# Patient Record
Sex: Male | Born: 1945 | Race: White | Hispanic: No | Marital: Married | Smoking: Never smoker
Health system: Southern US, Community
[De-identification: ages and names within clinical notes are randomized; demographics above are authoritative.]

---

## 2020-12-13 DIAGNOSIS — K661 Hemoperitoneum: Secondary | ICD-10-CM

## 2020-12-13 HISTORY — DX: Hemoperitoneum: K66.1

## 2021-05-13 DIAGNOSIS — I82A11 Acute embolism and thrombosis of right axillary vein: Secondary | ICD-10-CM

## 2021-05-13 HISTORY — DX: Acute embolism and thrombosis of right axillary vein: I82.A11

## 2021-05-28 ENCOUNTER — Inpatient Hospital Stay
Admission: RE | Admit: 2021-05-28 | Discharge: 2021-07-23 | Disposition: A | Discharge status: Skilled Nursing Facility | Payer: Medicare Other | Source: Ambulatory Visit | Attending: Internal Medicine | Admitting: Internal Medicine

## 2021-05-28 DIAGNOSIS — R58 Hemorrhage, not elsewhere classified: Secondary | ICD-10-CM

## 2021-05-28 DIAGNOSIS — K567 Ileus, unspecified: Secondary | ICD-10-CM

## 2021-05-28 DIAGNOSIS — R509 Fever, unspecified: Secondary | ICD-10-CM

## 2021-05-28 DIAGNOSIS — J189 Pneumonia, unspecified organism: Secondary | ICD-10-CM

## 2021-05-29 LAB — BASIC METABOLIC PANEL
Anion gap: 7 (ref 5–15)
BUN: 25 mg/dL — ABNORMAL HIGH (ref 8–23)
CO2: 25 mmol/L (ref 22–32)
Calcium: 10.4 mg/dL — ABNORMAL HIGH (ref 8.9–10.3)
Chloride: 102 mmol/L (ref 98–111)
Creatinine, Ser: 0.88 mg/dL (ref 0.61–1.24)
GFR, Estimated: >60 mL/min (ref >60)
Glucose, Bld: 105 mg/dL — ABNORMAL HIGH (ref 70–99)
Potassium: 3.8 mmol/L (ref 3.5–5.1)
Sodium: 134 mmol/L — ABNORMAL LOW (ref 135–145)

## 2021-05-29 LAB — CBC
HCT: 37.7 % — ABNORMAL LOW (ref 39.0–52.0)
Hemoglobin: 13.1 g/dL (ref 13.0–17.0)
MCH: 31.2 pg (ref 26.0–34.0)
MCHC: 34.7 g/dL (ref 30.0–36.0)
MCV: 89.8 fL (ref 80.0–100.0)
Platelets: 238 10*3/uL (ref 150–400)
RBC: 4.2 MIL/uL — ABNORMAL LOW (ref 4.22–5.81)
RDW: 13 % (ref 11.5–15.5)
WBC: 7.8 10*3/uL (ref 4.0–10.5)
nRBC: 0 % (ref 0.0–0.2)

## 2021-05-29 LAB — VANCOMYCIN, TROUGH
Vancomycin Tr: 39 ug/mL (ref 15–20)
Vancomycin Tr: 29 ug/mL (ref 15–20)

## 2021-05-30 ENCOUNTER — Other Ambulatory Visit (HOSPITAL_COMMUNITY): Payer: Medicare Other

## 2021-05-30 LAB — CBC
HCT: 36.9 % — ABNORMAL LOW (ref 39.0–52.0)
Hemoglobin: 12.9 g/dL — ABNORMAL LOW (ref 13.0–17.0)
MCH: 31.3 pg (ref 26.0–34.0)
MCHC: 35 g/dL (ref 30.0–36.0)
MCV: 89.6 fL (ref 80.0–100.0)
Platelets: 308 10*3/uL (ref 150–400)
RBC: 4.12 MIL/uL — ABNORMAL LOW (ref 4.22–5.81)
RDW: 13.1 % (ref 11.5–15.5)
WBC: 8.6 10*3/uL (ref 4.0–10.5)
nRBC: 0 % (ref 0.0–0.2)

## 2021-05-30 LAB — VANCOMYCIN, TROUGH: Vancomycin Tr: 18 ug/mL (ref 15–20)

## 2021-05-31 ENCOUNTER — Other Ambulatory Visit (HOSPITAL_COMMUNITY): Payer: Medicare Other

## 2021-05-31 LAB — BASIC METABOLIC PANEL
Anion gap: 13 (ref 5–15)
BUN: 25 mg/dL — ABNORMAL HIGH (ref 8–23)
CO2: 25 mmol/L (ref 22–32)
Calcium: 10.6 mg/dL — ABNORMAL HIGH (ref 8.9–10.3)
Chloride: 97 mmol/L — ABNORMAL LOW (ref 98–111)
Creatinine, Ser: 1.18 mg/dL (ref 0.61–1.24)
GFR, Estimated: >60 mL/min (ref >60)
Glucose, Bld: 112 mg/dL — ABNORMAL HIGH (ref 70–99)
Potassium: 4 mmol/L (ref 3.5–5.1)
Sodium: 135 mmol/L (ref 135–145)

## 2021-05-31 LAB — BLOOD GAS, ARTERIAL
Acid-Base Excess: 0.5 mmol/L (ref 0.0–2.0)
Bicarbonate: 23.9 mmol/L (ref 20.0–28.0)
FIO2: 21
O2 Saturation: 98.5 %
Patient temperature: 37
pCO2 arterial: 33.7 mmHg (ref 32.0–48.0)
pH, Arterial: 7.464 — ABNORMAL HIGH (ref 7.350–7.450)
pO2, Arterial: 109 mmHg — ABNORMAL HIGH (ref 83.0–108.0)

## 2021-05-31 LAB — CBC
HCT: 38.7 % — ABNORMAL LOW (ref 39.0–52.0)
Hemoglobin: 13.1 g/dL (ref 13.0–17.0)
MCH: 30.8 pg (ref 26.0–34.0)
MCHC: 33.9 g/dL (ref 30.0–36.0)
MCV: 91.1 fL (ref 80.0–100.0)
Platelets: 307 10*3/uL (ref 150–400)
RBC: 4.25 MIL/uL (ref 4.22–5.81)
RDW: 13.2 % (ref 11.5–15.5)
WBC: 10.3 10*3/uL (ref 4.0–10.5)
nRBC: 0 % (ref 0.0–0.2)

## 2021-06-01 ENCOUNTER — Encounter (HOSPITAL_BASED_OUTPATIENT_CLINIC_OR_DEPARTMENT_OTHER): Payer: Medicare Other

## 2021-06-01 DIAGNOSIS — I82401 Acute embolism and thrombosis of unspecified deep veins of right lower extremity: Secondary | ICD-10-CM

## 2021-06-01 LAB — BASIC METABOLIC PANEL
Anion gap: 12 (ref 5–15)
BUN: 20 mg/dL (ref 8–23)
CO2: 23 mmol/L (ref 22–32)
Calcium: 10.3 mg/dL (ref 8.9–10.3)
Chloride: 96 mmol/L — ABNORMAL LOW (ref 98–111)
Creatinine, Ser: 1.17 mg/dL (ref 0.61–1.24)
GFR, Estimated: >60 mL/min (ref >60)
Glucose, Bld: 117 mg/dL — ABNORMAL HIGH (ref 70–99)
Potassium: 3.7 mmol/L (ref 3.5–5.1)
Sodium: 131 mmol/L — ABNORMAL LOW (ref 135–145)

## 2021-06-01 NOTE — Progress Notes (Signed)
VASCULAR LAB    Right upper extremity venous has been performed.  See CV proc for preliminary results.  Gave verbal report to Dr. Norlene Duel, [facility], RVT 06/01/2021, 11:42 AM

## 2021-06-02 ENCOUNTER — Other Ambulatory Visit (HOSPITAL_COMMUNITY): Payer: Medicare Other

## 2021-06-03 LAB — PROTIME-INR
INR: 1.2 (ref 0.8–1.2)
Prothrombin Time: 15.6 seconds — ABNORMAL HIGH (ref 11.4–15.2)

## 2021-06-03 NOTE — Progress Notes (Signed)
IR consulted by Dr. Sharyon Medicus for possible image-guided percutaneous gastrostomy tube placement.  Case/images have been reviewed by Dr. Lowella Dandy who approves procedure.  Discussed case with Dawn, RN who states patient has been tolerating PO intake for the past few days. Discussed with Dr. Sharyon Medicus who recommends holding on gastrotomy tube placement at this time. No plans for IR interventions- will delete order. Dr. Sharyon Medicus aware to re-consult IR if procedure requested in the future.  IR available if needed.   Benjamin Boga Amora Sheehy, PA-C 06/03/2021, 9:07 AM

## 2021-06-04 LAB — PROTIME-INR
INR: 1.2 (ref 0.8–1.2)
Prothrombin Time: 14.9 seconds (ref 11.4–15.2)

## 2021-06-05 LAB — PROTIME-INR
INR: 1.6 — ABNORMAL HIGH (ref 0.8–1.2)
Prothrombin Time: 19.3 seconds — ABNORMAL HIGH (ref 11.4–15.2)

## 2021-06-06 LAB — BASIC METABOLIC PANEL
Anion gap: 9 (ref 5–15)
BUN: 8 mg/dL (ref 8–23)
CO2: 25 mmol/L (ref 22–32)
Calcium: 9.7 mg/dL (ref 8.9–10.3)
Chloride: 97 mmol/L — ABNORMAL LOW (ref 98–111)
Creatinine, Ser: 1.05 mg/dL (ref 0.61–1.24)
GFR, Estimated: >60 mL/min (ref >60)
Glucose, Bld: 122 mg/dL — ABNORMAL HIGH (ref 70–99)
Potassium: 3 mmol/L — ABNORMAL LOW (ref 3.5–5.1)
Sodium: 131 mmol/L — ABNORMAL LOW (ref 135–145)

## 2021-06-06 LAB — CBC
HCT: 33.6 % — ABNORMAL LOW (ref 39.0–52.0)
Hemoglobin: 11.8 g/dL — ABNORMAL LOW (ref 13.0–17.0)
MCH: 30.8 pg (ref 26.0–34.0)
MCHC: 35.1 g/dL (ref 30.0–36.0)
MCV: 87.7 fL (ref 80.0–100.0)
Platelets: 277 10*3/uL (ref 150–400)
RBC: 3.83 MIL/uL — ABNORMAL LOW (ref 4.22–5.81)
RDW: 12.8 % (ref 11.5–15.5)
WBC: 5.6 10*3/uL (ref 4.0–10.5)
nRBC: 0 % (ref 0.0–0.2)

## 2021-06-06 LAB — MAGNESIUM: Magnesium: 1.6 mg/dL — ABNORMAL LOW (ref 1.7–2.4)

## 2021-06-06 LAB — PROTIME-INR
INR: 2.1 — ABNORMAL HIGH (ref 0.8–1.2)
Prothrombin Time: 23.6 seconds — ABNORMAL HIGH (ref 11.4–15.2)

## 2021-06-07 LAB — PROTIME-INR
INR: 3.3 — ABNORMAL HIGH (ref 0.8–1.2)
Prothrombin Time: 33.2 seconds — ABNORMAL HIGH (ref 11.4–15.2)

## 2021-06-07 LAB — POTASSIUM: Potassium: 3.4 mmol/L — ABNORMAL LOW (ref 3.5–5.1)

## 2021-06-07 LAB — MAGNESIUM: Magnesium: 2.1 mg/dL (ref 1.7–2.4)

## 2021-06-08 LAB — BASIC METABOLIC PANEL
Anion gap: 7 (ref 5–15)
BUN: 12 mg/dL (ref 8–23)
CO2: 26 mmol/L (ref 22–32)
Calcium: 9.3 mg/dL (ref 8.9–10.3)
Chloride: 100 mmol/L (ref 98–111)
Creatinine, Ser: 1 mg/dL (ref 0.61–1.24)
GFR, Estimated: >60 mL/min (ref >60)
Glucose, Bld: 119 mg/dL — ABNORMAL HIGH (ref 70–99)
Potassium: 3.8 mmol/L (ref 3.5–5.1)
Sodium: 133 mmol/L — ABNORMAL LOW (ref 135–145)

## 2021-06-08 LAB — PROTIME-INR
INR: 3.4 — ABNORMAL HIGH (ref 0.8–1.2)
Prothrombin Time: 33.9 seconds — ABNORMAL HIGH (ref 11.4–15.2)

## 2021-06-09 LAB — PROTIME-INR
INR: 3.1 — ABNORMAL HIGH (ref 0.8–1.2)
Prothrombin Time: 32 seconds — ABNORMAL HIGH (ref 11.4–15.2)

## 2021-06-10 LAB — PROTIME-INR
INR: 3.5 — ABNORMAL HIGH (ref 0.8–1.2)
Prothrombin Time: 35 seconds — ABNORMAL HIGH (ref 11.4–15.2)

## 2021-06-10 NOTE — Progress Notes (Signed)
IR consulted by Dr. Manson Passey for re-evaluation of possible image-guided percutaneous gastrostomy tube placement.   Gastrostomy tube originally ordered by Dr. Sharyon Medicus the week of 06/01/2021. At that time, case/images were reviewed by Dr. Lowella Dandy who approved procedure. However, also at that time patient tolerating PO intake- therefore procedure was cancelled. Per Perlie Gold, RN and Dr. Manson Passey patient's PO intake now declining, therefore re-evaluation for placement was made.   Patient currently on Coumadin/Aspirin (LD today 06/10/2021) with INR 3.5 today- minimum 5 day hold for these mediations, along with INR <1.5 to proceed. Dr. Manson Passey to hold Coumadin/Aspirin at this time and trend INR. Plan for image-guided percutaneous gastrostomy tube placement in IR tentatively for early next week pending IR scheduling/holding of anticoagulation/INR within acceptable limits. Formal consult to follow.  Please call IR with questions/concerns.   Benjamin Boga Nechemia Chiappetta, PA-C 06/10/2021, 2:45 PM

## 2021-06-11 LAB — BASIC METABOLIC PANEL
Anion gap: 9 (ref 5–15)
BUN: 19 mg/dL (ref 8–23)
CO2: 21 mmol/L — ABNORMAL LOW (ref 22–32)
Calcium: 9.1 mg/dL (ref 8.9–10.3)
Chloride: 98 mmol/L (ref 98–111)
Creatinine, Ser: 1.05 mg/dL (ref 0.61–1.24)
GFR, Estimated: >60 mL/min (ref >60)
Glucose, Bld: 118 mg/dL — ABNORMAL HIGH (ref 70–99)
Potassium: 4 mmol/L (ref 3.5–5.1)
Sodium: 128 mmol/L — ABNORMAL LOW (ref 135–145)

## 2021-06-11 LAB — CBC
HCT: 24.5 % — ABNORMAL LOW (ref 39.0–52.0)
Hemoglobin: 8.2 g/dL — ABNORMAL LOW (ref 13.0–17.0)
MCH: 30.3 pg (ref 26.0–34.0)
MCHC: 33.5 g/dL (ref 30.0–36.0)
MCV: 90.4 fL (ref 80.0–100.0)
Platelets: 242 10*3/uL (ref 150–400)
RBC: 2.71 MIL/uL — ABNORMAL LOW (ref 4.22–5.81)
RDW: 13.2 % (ref 11.5–15.5)
WBC: 11.4 10*3/uL — ABNORMAL HIGH (ref 4.0–10.5)
nRBC: 0 % (ref 0.0–0.2)

## 2021-06-11 LAB — PROTIME-INR
INR: 4 — ABNORMAL HIGH (ref 0.8–1.2)
Prothrombin Time: 38.9 seconds — ABNORMAL HIGH (ref 11.4–15.2)

## 2021-06-12 LAB — PROTIME-INR
INR: 3.8 — ABNORMAL HIGH (ref 0.8–1.2)
Prothrombin Time: 37.7 seconds — ABNORMAL HIGH (ref 11.4–15.2)

## 2021-06-13 LAB — SARS CORONAVIRUS 2 (TAT 6-24 HRS): SARS Coronavirus 2: NEGATIVE

## 2021-06-13 LAB — BASIC METABOLIC PANEL
Anion gap: 7 (ref 5–15)
BUN: 16 mg/dL (ref 8–23)
CO2: 21 mmol/L — ABNORMAL LOW (ref 22–32)
Calcium: 8.8 mg/dL — ABNORMAL LOW (ref 8.9–10.3)
Chloride: 101 mmol/L (ref 98–111)
Creatinine, Ser: 0.93 mg/dL (ref 0.61–1.24)
GFR, Estimated: >60 mL/min (ref >60)
Glucose, Bld: 112 mg/dL — ABNORMAL HIGH (ref 70–99)
Potassium: 3.7 mmol/L (ref 3.5–5.1)
Sodium: 129 mmol/L — ABNORMAL LOW (ref 135–145)

## 2021-06-13 LAB — PROTIME-INR
INR: 3.1 — ABNORMAL HIGH (ref 0.8–1.2)
Prothrombin Time: 31.7 seconds — ABNORMAL HIGH (ref 11.4–15.2)

## 2021-06-13 LAB — CBC
HCT: 20 % — ABNORMAL LOW (ref 39.0–52.0)
Hemoglobin: 6.5 g/dL — CL (ref 13.0–17.0)
MCH: 29.7 pg (ref 26.0–34.0)
MCHC: 32.5 g/dL (ref 30.0–36.0)
MCV: 91.3 fL (ref 80.0–100.0)
Platelets: 229 10*3/uL (ref 150–400)
RBC: 2.19 MIL/uL — ABNORMAL LOW (ref 4.22–5.81)
RDW: 13.3 % (ref 11.5–15.5)
WBC: 8.1 10*3/uL (ref 4.0–10.5)
nRBC: 0 % (ref 0.0–0.2)

## 2021-06-13 LAB — ABO/RH: ABO/RH(D): B POS

## 2021-06-13 NOTE — Consult Note (Signed)
Chief Complaint: Patient was seen in consultation today for percutaneous gastric tube placement at the request of Dr Sharyon Medicus   Supervising Physician: Malachy Moan  Patient Status: [facility] IP  History of Present Illness: Benjamin Barnett is a 75 y.o. male   [facility] pt Hx HTN; HLD; Chronic Resp Failure Admitted with AMS Suffered cardiac arrest/PEA-- transferred to [facility] after hospitalization Extubated  Admitted to [facility] to manage Resp failure Dysphagia Encephalopathy  Need for long term care Request made for percutaneous gastric tube placement  LD coumadin 06/06/21  Imaging reviewed by IR Rad--- approved for procedure Planned for 06/16/21    Allergies: Patient has no allergy information on record.  Medications: Prior to Admission medications   Not on File     No family history on file.  Social History   Socioeconomic History   Marital status: Married    Spouse name: Not on file   Number of children: Not on file   Years of education: Not on file   Highest education level: Not on file  Occupational History   Not on file  Tobacco Use   Smoking status: Not on file   Smokeless tobacco: Not on file  Substance and Sexual Activity   Alcohol use: Not on file   Drug use: Not on file   Sexual activity: Not on file  Other Topics Concern   Not on file  Social History Narrative   Not on file   Social Determinants of Health   Financial Resource Strain: Not on file  Food Insecurity: Not on file  Transportation Needs: Not on file  Physical Activity: Not on file  Stress: Not on file  Social Connections: Not on file    Review of Systems: A 12 point ROS discussed and pertinent positives are indicated in the HPI above.  All other systems are negative.    Vital Signs: There were no vitals taken for this visit.  Physical Exam HENT:     Mouth/Throat:     Mouth: Mucous membranes are moist.  Cardiovascular:     Rate and Rhythm: Normal rate.  Pulmonary:      Breath sounds: Normal breath sounds.  Abdominal:     Palpations: Abdomen is soft.  Musculoskeletal:     Comments: Asleep-- difficult to arouse  Skin:    General: Skin is warm.  Neurological:     Comments: Does not follow commands  Psychiatric:     Comments: Spoke to Dtr Joycie Peek via phone Agrees to procedure    Imaging: CT ABDOMEN WO CONTRAST  Result Date: 06/02/2021 CLINICAL DATA:  Evaluate anatomy for percutaneous gastrostomy tube placement. EXAM: CT ABDOMEN WITHOUT CONTRAST TECHNIQUE: Multidetector CT imaging of the abdomen was performed following the standard protocol without IV contrast. COMPARISON:  CT abdomen 01/01/2011 FINDINGS: Lower chest: Small right pleural effusion. Questionable trace left pleural fluid. Compressive atelectasis at the lung bases. Patient has coronary artery calcifications. Poorly defined nodular area at the right lung base on sequence 4, image 6 most likely related to atelectasis but there is also motion artifact in this area. However, this area is indeterminate and the nodular area measures 8 x 6 mm, mean diameter of 7 mm. Hepatobiliary: Cholecystectomy. Artifact from the patient's arms limits evaluation of the liver. Pancreas: Unremarkable. No pancreatic ductal dilatation or surrounding inflammatory changes. Spleen: Normal in size without focal abnormality. Adrenals/Urinary Tract: Normal appearance of the adrenal glands. Large low-density cyst in the right kidney upper pole measures up to 9.0 cm. 3  mm stone in the right kidney lower pole region. Negative for hydronephrosis. Stomach/Bowel: Stomach is along the anterior upper abdomen with a normal configuration. There are no bowel structures between the stomach and the anterior abdominal wall. The transverse colon is caudal to the stomach. There is no evidence for bowel obstruction or focal bowel inflammation. Vascular/Lymphatic: Atherosclerotic calcifications in the abdominal aorta without aneurysm. No  abdominal lymph node enlargement. Other: Negative for ascites.  Negative for free air. Musculoskeletal: Significant narrowing of the spinal canal at L3-L4 and L4-L5. Severe degenerative facet arthropathy in the lower lumbar spine. Bridging osteophytes in the lower thoracic spine. IMPRESSION: 1. Anatomy is amendable for percutaneous gastrostomy tube placement. 2. Small right pleural effusion with compressive atelectasis at the lung bases. 3. Nodular area at the right lung base could be associated with atelectasis but indeterminate. Mean diameter of this area measures 7 mm. Non-contrast chest CT at 6-12 months is recommended. If the nodule is stable at time of repeat CT, then future CT at 18-24 months (from today's scan) is considered optional for low-risk patients, but is recommended for high-risk patients. This recommendation follows the consensus statement: Guidelines for Management of Incidental Pulmonary Nodules Detected on CT Images: From the Fleischner Society 2017; Radiology 2017; 284:228-243. 4. Nonobstructive right kidney stone.  Large right renal cyst. 5. Severe degenerative changes in lumbar spine with evidence of spinal canal narrowing. Electronically Signed   By: Richarda Overlie M.D.   On: 06/02/2021 17:18   DG CHEST PORT 1 VIEW  Result Date: 05/31/2021 CLINICAL DATA:  75 year old male with shortness of breath. EXAM: PORTABLE CHEST 1 VIEW COMPARISON:  Abdominal radiographs 05/30/2021. FINDINGS: Portable AP semi upright view at 0637 hours. Mildly low lung volumes. Normal cardiac size and mediastinal contours. Streaky asymmetric perihilar opacity on the right. Allowing for portable technique the left lung is clear. No pneumothorax or pleural effusion. Severe degenerative changes at the shoulders greater on the left. No acute osseous abnormality identified. Paucity of bowel gas in the upper abdomen. IMPRESSION: Asymmetric right perihilar opacity is nonspecific but suspicious for bronchopneumonia.  Electronically Signed   By: Odessa Fleming M.D.   On: 05/31/2021 07:55   DG Abd Portable 1V  Result Date: 05/30/2021 CLINICAL DATA:  Nausea and vomiting EXAM: PORTABLE ABDOMEN - 1 VIEW COMPARISON:  None. FINDINGS: Two supine frontal views of the abdomen and pelvis demonstrate an unremarkable bowel gas pattern. No masses or abnormal calcifications. Diffuse thoracolumbar spondylosis. Lung bases are clear. IMPRESSION: 1. Unremarkable bowel gas pattern. Electronically Signed   By: Sharlet Salina M.D.   On: 05/30/2021 21:19   VAS [place] UPPER EXTREMITY VENOUS DUPLEX  Result Date: 06/01/2021 UPPER VENOUS STUDY  Patient Name:  CLESTER CHLEBOWSKI [facility]  Date of Exam:   06/01/2021 Medical Rec #: [id number]          Accession #:    [id number] Date of Birth: [birth year]          Patient Gender: M Patient Age:   74Y Exam Location:  [facility] Procedure:      VAS [place] UPPER EXTREMITY VENOUS DUPLEX Referring Phys: [id number] ALI HIJAZI --------------------------------------------------------------------------------  Indications: IV team saw clot while trying to place PICC Limitations: Patient confusion and movement. Comparison Study: No prior study Performing Technologist: Sherren Kerns RVS  Examination Guidelines: A complete evaluation includes B-mode imaging, spectral Doppler, color Doppler, and power Doppler as needed of all accessible portions of each vessel. Bilateral testing is considered an integral part of a complete examination.  Limited examinations for reoccurring indications may be performed as noted.  Right Findings: +----------+------------+---------+-----------+----------+-------+ RIGHT     CompressiblePhasicitySpontaneousPropertiesSummary +----------+------------+---------+-----------+----------+-------+ IJV                      Yes       Yes                      +----------+------------+---------+-----------+----------+-------+ Subclavian               Yes       Yes                       +----------+------------+---------+-----------+----------+-------+ Axillary      None       No        No                       +----------+------------+---------+-----------+----------+-------+ Brachial      None       No        No                       +----------+------------+---------+-----------+----------+-------+ Radial        Full                                          +----------+------------+---------+-----------+----------+-------+ Ulnar         Full                                          +----------+------------+---------+-----------+----------+-------+ Cephalic      Full                                          +----------+------------+---------+-----------+----------+-------+ Basilic       Full                                          +----------+------------+---------+-----------+----------+-------+  Left Findings: +----------+------------+---------+-----------+----------+-------+ LEFT      CompressiblePhasicitySpontaneousPropertiesSummary +----------+------------+---------+-----------+----------+-------+ Subclavian               Yes       Yes                      +----------+------------+---------+-----------+----------+-------+  Summary:  Right: No evidence of superficial vein thrombosis in the upper extremity. Findings consistent with acute deep vein thrombosis involving the right axillary vein and right brachial veins.  Left: No evidence of thrombosis in the subclavian.  *See table(s) above for measurements and observations.  Diagnosing physician: Fabienne Bruns MD Electronically signed by Fabienne Bruns MD on 06/01/2021 at 6:50:52 PM.    Final     Labs:  CBC: Recent Labs    05/30/21 1435 05/31/21 0330 06/06/21 0348 06/11/21 0313  WBC 8.6 10.3 5.6 11.4*  HGB 12.9* 13.1 11.8* 8.2*  HCT 36.9* 38.7* 33.6* 24.5*  PLT 308 307 277 242    COAGS: Recent Labs    06/10/21 0507 06/11/21 0313 06/12/21 0409 06/13/21 0425  INR  3.5* 4.0* 3.8* 3.1*    BMP: Recent Labs    06/01/21 0732 06/06/21 0348 06/07/21 1015 06/08/21 0754 06/11/21 0313  NA 131* 131*  --  133* 128*  K 3.7 3.0* 3.4* 3.8 4.0  CL 96* 97*  --  100 98  CO2 23 25  --  26 21*  GLUCOSE 117* 122*  --  119* 118*  BUN 20 8  --  12 19  CALCIUM 10.3 9.7  --  9.3 9.1  CREATININE 1.17 1.05  --  1.00 1.05  GFRNONAA >60 >60  --  >60 >60    LIVER FUNCTION TESTS: No results for input(s): BILITOT, AST, ALT, ALKPHOS, PROT, ALBUMIN in the last 8760 hours.  TUMOR MARKERS: No results for input(s): AFPTM, CEA, CA199, CHROMGRNA in the last 8760 hours.  Assessment and Plan  AMS; encephalopathy Dysphagia Need for long term care Scheduled for percutaneous gastric tube placement 7/5 in IR LD Coumadin 6/25 per RN Risks and benefits image guided gastrostomy tube placement was discussed with the patient's daughter Joycie Peekmanda Daniels via phone including, but not limited to the need for a barium enema during the procedure, bleeding, infection, peritonitis and/or damage to adjacent structures.  All questions were answered, patient is agreeable to proceed. Consent signed and in chart.   Thank you for this interesting consult.  I greatly enjoyed meeting [facility] and look forward to participating in their care.  A copy of this report was sent to the requesting provider on this date.  Electronically Signed: Robet LeuPamela A Elza Sortor, PA-C 06/13/2021, 10:56 AM   I spent a total of 40 Minutes    in face to face in clinical consultation, greater than 50% of which was counseling/coordinating care for percutaneous gastric tube placement

## 2021-06-14 ENCOUNTER — Other Ambulatory Visit (HOSPITAL_COMMUNITY): Payer: Medicare Other

## 2021-06-14 LAB — BASIC METABOLIC PANEL
Anion gap: 6 (ref 5–15)
BUN: 13 mg/dL (ref 8–23)
CO2: 24 mmol/L (ref 22–32)
Calcium: 8.7 mg/dL — ABNORMAL LOW (ref 8.9–10.3)
Chloride: 101 mmol/L (ref 98–111)
Creatinine, Ser: 0.96 mg/dL (ref 0.61–1.24)
GFR, Estimated: >60 mL/min (ref >60)
Glucose, Bld: 113 mg/dL — ABNORMAL HIGH (ref 70–99)
Potassium: 3.4 mmol/L — ABNORMAL LOW (ref 3.5–5.1)
Sodium: 131 mmol/L — ABNORMAL LOW (ref 135–145)

## 2021-06-14 LAB — CBC
HCT: 21.6 % — ABNORMAL LOW (ref 39.0–52.0)
Hemoglobin: 7.3 g/dL — ABNORMAL LOW (ref 13.0–17.0)
MCH: 30.7 pg (ref 26.0–34.0)
MCHC: 33.8 g/dL (ref 30.0–36.0)
MCV: 90.8 fL (ref 80.0–100.0)
Platelets: 237 10*3/uL (ref 150–400)
RBC: 2.38 MIL/uL — ABNORMAL LOW (ref 4.22–5.81)
RDW: 14.2 % (ref 11.5–15.5)
WBC: 8.6 10*3/uL (ref 4.0–10.5)
nRBC: 0 % (ref 0.0–0.2)

## 2021-06-14 LAB — BPAM RBC
Blood Product Expiration Date: 202207172359
ISSUE DATE / TIME: 202207021542
Unit Type and Rh: 7300

## 2021-06-14 LAB — URINALYSIS, ROUTINE W REFLEX MICROSCOPIC
Bilirubin Urine: NEGATIVE
Glucose, UA: NEGATIVE mg/dL
Ketones, ur: NEGATIVE mg/dL
Nitrite: NEGATIVE
Protein, ur: NEGATIVE mg/dL
RBC / HPF: >50 RBC/hpf — ABNORMAL HIGH (ref 0–5)
Specific Gravity, Urine: 1.006 (ref 1.005–1.030)
pH: 6 (ref 5.0–8.0)

## 2021-06-14 LAB — TYPE AND SCREEN
ABO/RH(D): B POS
Antibody Screen: NEGATIVE
Unit division: 0

## 2021-06-14 LAB — PROTIME-INR
INR: 1.7 — ABNORMAL HIGH (ref 0.8–1.2)
Prothrombin Time: 20.3 seconds — ABNORMAL HIGH (ref 11.4–15.2)

## 2021-06-15 LAB — PROTIME-INR
INR: 1.3 — ABNORMAL HIGH (ref 0.8–1.2)
Prothrombin Time: 16.3 seconds — ABNORMAL HIGH (ref 11.4–15.2)

## 2021-06-16 ENCOUNTER — Other Ambulatory Visit (HOSPITAL_COMMUNITY): Payer: Medicare Other

## 2021-06-16 HISTORY — PX: IR GASTROSTOMY TUBE MOD SED: IMG625

## 2021-06-16 LAB — PROTIME-INR
INR: 1.1 (ref 0.8–1.2)
Prothrombin Time: 14.6 seconds (ref 11.4–15.2)

## 2021-06-16 LAB — CBC
HCT: 24 % — ABNORMAL LOW (ref 39.0–52.0)
Hemoglobin: 7.8 g/dL — ABNORMAL LOW (ref 13.0–17.0)
MCH: 29.9 pg (ref 26.0–34.0)
MCHC: 32.5 g/dL (ref 30.0–36.0)
MCV: 92 fL (ref 80.0–100.0)
Platelets: 289 10*3/uL (ref 150–400)
RBC: 2.61 MIL/uL — ABNORMAL LOW (ref 4.22–5.81)
RDW: 14.2 % (ref 11.5–15.5)
WBC: 8.2 10*3/uL (ref 4.0–10.5)
nRBC: 0 % (ref 0.0–0.2)

## 2021-06-16 LAB — URINE CULTURE: Culture: 60000 — AB

## 2021-06-16 MED ORDER — ONDANSETRON HCL 4 MG/2ML IJ SOLN
INTRAMUSCULAR | Status: AC
  Filled 2021-06-16: qty 2

## 2021-06-16 MED ORDER — MIDAZOLAM HCL 2 MG/2ML IJ SOLN
INTRAMUSCULAR | Status: AC | PRN
  Administered 2021-06-16: 0.5 mg via INTRAVENOUS

## 2021-06-16 MED ORDER — MIDAZOLAM HCL 2 MG/2ML IJ SOLN
INTRAMUSCULAR | Status: AC
  Filled 2021-06-16: qty 4

## 2021-06-16 MED ORDER — FENTANYL CITRATE (PF) 100 MCG/2ML IJ SOLN
INTRAMUSCULAR | Status: AC
  Filled 2021-06-16: qty 4

## 2021-06-16 MED ORDER — CEFAZOLIN SODIUM-DEXTROSE 2-4 GM/100ML-% IV SOLN
INTRAVENOUS | Status: AC
  Administered 2021-06-16: 2 g via INTRAVENOUS
  Filled 2021-06-16: qty 100

## 2021-06-16 MED ORDER — ONDANSETRON HCL 4 MG/2ML IJ SOLN
INTRAMUSCULAR | Status: AC | PRN
  Administered 2021-06-16: 4 mg via INTRAVENOUS

## 2021-06-16 MED ORDER — FENTANYL CITRATE (PF) 100 MCG/2ML IJ SOLN
INTRAMUSCULAR | Status: AC | PRN
  Administered 2021-06-16: 25 ug via INTRAVENOUS

## 2021-06-16 MED ORDER — LIDOCAINE HCL (PF) 1 % IJ SOLN
INTRAMUSCULAR | Status: AC | PRN
  Administered 2021-06-16: 30 mL

## 2021-06-16 MED ORDER — LIDOCAINE HCL 1 % IJ SOLN
INTRAMUSCULAR | Status: AC
  Filled 2021-06-16: qty 20

## 2021-06-16 MED ORDER — IOHEXOL 300 MG/ML  SOLN: 50.0000 mL | Freq: Once | INTRAMUSCULAR | Status: DC | PRN

## 2021-06-16 MED ORDER — GLUCAGON HCL RDNA (DIAGNOSTIC) 1 MG IJ SOLR
INTRAMUSCULAR | Status: AC
  Filled 2021-06-16: qty 1

## 2021-06-16 MED ORDER — CEFAZOLIN SODIUM-DEXTROSE 2-4 GM/100ML-% IV SOLN: 2.0000 g | Freq: Once | INTRAVENOUS | Status: AC

## 2021-06-16 NOTE — Procedures (Signed)
Interventional Radiology Procedure Note  Procedure: Placement of percutaneous 20F pull-through gastrostomy tube. Complications: None Recommendations: - NPO except for sips and chips remainder of today and overnight - Maintain G-tube to LWS until tomorrow morning  - May advance diet as tolerated and begin using tube tomorrow morning  Signed,   Danicia Terhaar S. Kamonte Mcmichen, DO   

## 2021-06-17 LAB — PROTIME-INR
INR: 1.3 — ABNORMAL HIGH (ref 0.8–1.2)
Prothrombin Time: 16.1 seconds — ABNORMAL HIGH (ref 11.4–15.2)

## 2021-06-17 NOTE — Progress Notes (Signed)
   Percutaneous gastric tube placed in IR 7/5  Site is c/d/I Tender to touch area No bleeding Afeb  May use now

## 2021-06-18 LAB — PROTIME-INR
INR: 1.3 — ABNORMAL HIGH (ref 0.8–1.2)
Prothrombin Time: 16.3 seconds — ABNORMAL HIGH (ref 11.4–15.2)

## 2021-06-19 LAB — BASIC METABOLIC PANEL
Anion gap: 4 — ABNORMAL LOW (ref 5–15)
BUN: 14 mg/dL (ref 8–23)
CO2: 27 mmol/L (ref 22–32)
Calcium: 9.7 mg/dL (ref 8.9–10.3)
Chloride: 101 mmol/L (ref 98–111)
Creatinine, Ser: 0.87 mg/dL (ref 0.61–1.24)
GFR, Estimated: >60 mL/min (ref >60)
Glucose, Bld: 104 mg/dL — ABNORMAL HIGH (ref 70–99)
Potassium: 3.5 mmol/L (ref 3.5–5.1)
Sodium: 132 mmol/L — ABNORMAL LOW (ref 135–145)

## 2021-06-19 LAB — PROTIME-INR
INR: 1.4 — ABNORMAL HIGH (ref 0.8–1.2)
Prothrombin Time: 17.6 seconds — ABNORMAL HIGH (ref 11.4–15.2)

## 2021-06-19 LAB — CULTURE, BLOOD (ROUTINE X 2)
Culture: NO GROWTH
Culture: NO GROWTH
Special Requests: ADEQUATE
Special Requests: ADEQUATE

## 2021-06-19 LAB — CBC
HCT: 23.7 % — ABNORMAL LOW (ref 39.0–52.0)
Hemoglobin: 7.8 g/dL — ABNORMAL LOW (ref 13.0–17.0)
MCH: 30.1 pg (ref 26.0–34.0)
MCHC: 32.9 g/dL (ref 30.0–36.0)
MCV: 91.5 fL (ref 80.0–100.0)
Platelets: 233 10*3/uL (ref 150–400)
RBC: 2.59 MIL/uL — ABNORMAL LOW (ref 4.22–5.81)
RDW: 14.3 % (ref 11.5–15.5)
WBC: 6.6 10*3/uL (ref 4.0–10.5)
nRBC: 0 % (ref 0.0–0.2)

## 2021-06-19 LAB — MAGNESIUM: Magnesium: 1.7 mg/dL (ref 1.7–2.4)

## 2021-06-20 ENCOUNTER — Other Ambulatory Visit (HOSPITAL_COMMUNITY): Payer: Medicare Other

## 2021-06-20 LAB — HEMOGLOBIN AND HEMATOCRIT, BLOOD
HCT: 24 % — ABNORMAL LOW (ref 39.0–52.0)
HCT: 23.2 % — ABNORMAL LOW (ref 39.0–52.0)
Hemoglobin: 7.7 g/dL — ABNORMAL LOW (ref 13.0–17.0)
Hemoglobin: 7.8 g/dL — ABNORMAL LOW (ref 13.0–17.0)

## 2021-06-20 LAB — CBC
HCT: 25.3 % — ABNORMAL LOW (ref 39.0–52.0)
Hemoglobin: 8.1 g/dL — ABNORMAL LOW (ref 13.0–17.0)
MCH: 29.5 pg (ref 26.0–34.0)
MCHC: 32 g/dL (ref 30.0–36.0)
MCV: 92 fL (ref 80.0–100.0)
Platelets: 307 10*3/uL (ref 150–400)
RBC: 2.75 MIL/uL — ABNORMAL LOW (ref 4.22–5.81)
RDW: 14.4 % (ref 11.5–15.5)
WBC: 7.4 10*3/uL (ref 4.0–10.5)
nRBC: 0 % (ref 0.0–0.2)

## 2021-06-20 LAB — PROTIME-INR
INR: 1.7 — ABNORMAL HIGH (ref 0.8–1.2)
Prothrombin Time: 19.8 seconds — ABNORMAL HIGH (ref 11.4–15.2)

## 2021-06-20 MED ORDER — IOHEXOL 300 MG/ML  SOLN
100.0000 mL | Freq: Once | INTRAMUSCULAR | Status: AC | PRN
  Administered 2021-06-20: 100 mL via INTRAVENOUS

## 2021-06-21 ENCOUNTER — Other Ambulatory Visit (HOSPITAL_COMMUNITY): Payer: Medicare Other

## 2021-06-21 LAB — PROTIME-INR
INR: 2 — ABNORMAL HIGH (ref 0.8–1.2)
Prothrombin Time: 22.8 seconds — ABNORMAL HIGH (ref 11.4–15.2)

## 2021-06-21 LAB — HEMOGLOBIN AND HEMATOCRIT, BLOOD
HCT: 24.4 % — ABNORMAL LOW (ref 39.0–52.0)
Hemoglobin: 7.8 g/dL — ABNORMAL LOW (ref 13.0–17.0)

## 2021-06-22 ENCOUNTER — Other Ambulatory Visit (HOSPITAL_COMMUNITY): Payer: Medicare Other

## 2021-06-22 LAB — PREPARE FRESH FROZEN PLASMA: Unit division: 0

## 2021-06-22 LAB — CBC
HCT: 27.5 % — ABNORMAL LOW (ref 39.0–52.0)
Hemoglobin: 8.8 g/dL — ABNORMAL LOW (ref 13.0–17.0)
MCH: 29.5 pg (ref 26.0–34.0)
MCHC: 32 g/dL (ref 30.0–36.0)
MCV: 92.3 fL (ref 80.0–100.0)
Platelets: 314 10*3/uL (ref 150–400)
RBC: 2.98 MIL/uL — ABNORMAL LOW (ref 4.22–5.81)
RDW: 14.5 % (ref 11.5–15.5)
WBC: 8.3 10*3/uL (ref 4.0–10.5)
nRBC: 0 % (ref 0.0–0.2)

## 2021-06-22 LAB — BPAM FFP
Blood Product Expiration Date: 202207122359
Blood Product Expiration Date: 202207142359
ISSUE DATE / TIME: 202207090856
ISSUE DATE / TIME: 202207101421
Unit Type and Rh: 7300
Unit Type and Rh: 7300

## 2021-06-22 LAB — BASIC METABOLIC PANEL
Anion gap: 8 (ref 5–15)
BUN: 9 mg/dL (ref 8–23)
CO2: 26 mmol/L (ref 22–32)
Calcium: 9.6 mg/dL (ref 8.9–10.3)
Chloride: 100 mmol/L (ref 98–111)
Creatinine, Ser: 0.92 mg/dL (ref 0.61–1.24)
GFR, Estimated: >60 mL/min (ref >60)
Glucose, Bld: 104 mg/dL — ABNORMAL HIGH (ref 70–99)
Potassium: 3.5 mmol/L (ref 3.5–5.1)
Sodium: 134 mmol/L — ABNORMAL LOW (ref 135–145)

## 2021-06-22 LAB — PROTIME-INR
INR: 2.1 — ABNORMAL HIGH (ref 0.8–1.2)
Prothrombin Time: 23.9 seconds — ABNORMAL HIGH (ref 11.4–15.2)

## 2021-06-23 ENCOUNTER — Other Ambulatory Visit (HOSPITAL_COMMUNITY): Payer: Medicare Other

## 2021-06-23 LAB — CBC
HCT: 27.3 % — ABNORMAL LOW (ref 39.0–52.0)
Hemoglobin: 8.6 g/dL — ABNORMAL LOW (ref 13.0–17.0)
MCH: 29.4 pg (ref 26.0–34.0)
MCHC: 31.5 g/dL (ref 30.0–36.0)
MCV: 93.2 fL (ref 80.0–100.0)
Platelets: 293 10*3/uL (ref 150–400)
RBC: 2.93 MIL/uL — ABNORMAL LOW (ref 4.22–5.81)
RDW: 14.9 % (ref 11.5–15.5)
WBC: 9.1 10*3/uL (ref 4.0–10.5)
nRBC: 0 % (ref 0.0–0.2)

## 2021-06-23 LAB — PROTIME-INR
INR: 2 — ABNORMAL HIGH (ref 0.8–1.2)
Prothrombin Time: 22.6 seconds — ABNORMAL HIGH (ref 11.4–15.2)

## 2021-06-24 ENCOUNTER — Other Ambulatory Visit (HOSPITAL_COMMUNITY): Payer: Medicare Other

## 2021-06-24 LAB — PROTIME-INR
INR: 1.8 — ABNORMAL HIGH (ref 0.8–1.2)
Prothrombin Time: 21.3 seconds — ABNORMAL HIGH (ref 11.4–15.2)

## 2021-06-25 LAB — PROTIME-INR
INR: 1.6 — ABNORMAL HIGH (ref 0.8–1.2)
Prothrombin Time: 19.4 seconds — ABNORMAL HIGH (ref 11.4–15.2)

## 2021-06-25 LAB — CBC
HCT: 28.1 % — ABNORMAL LOW (ref 39.0–52.0)
Hemoglobin: 8.9 g/dL — ABNORMAL LOW (ref 13.0–17.0)
MCH: 29.3 pg (ref 26.0–34.0)
MCHC: 31.7 g/dL (ref 30.0–36.0)
MCV: 92.4 fL (ref 80.0–100.0)
Platelets: 317 10*3/uL (ref 150–400)
RBC: 3.04 MIL/uL — ABNORMAL LOW (ref 4.22–5.81)
RDW: 14.6 % (ref 11.5–15.5)
WBC: 10.2 10*3/uL (ref 4.0–10.5)
nRBC: 0 % (ref 0.0–0.2)

## 2021-06-25 LAB — COMPREHENSIVE METABOLIC PANEL
ALT: 14 U/L (ref 0–44)
AST: 22 U/L (ref 15–41)
Albumin: 2.5 g/dL — ABNORMAL LOW (ref 3.5–5.0)
Alkaline Phosphatase: 51 U/L (ref 38–126)
Anion gap: 6 (ref 5–15)
BUN: 18 mg/dL (ref 8–23)
CO2: 27 mmol/L (ref 22–32)
Calcium: 9.2 mg/dL (ref 8.9–10.3)
Chloride: 103 mmol/L (ref 98–111)
Creatinine, Ser: 0.95 mg/dL (ref 0.61–1.24)
GFR, Estimated: >60 mL/min (ref >60)
Glucose, Bld: 119 mg/dL — ABNORMAL HIGH (ref 70–99)
Potassium: 3.5 mmol/L (ref 3.5–5.1)
Sodium: 136 mmol/L (ref 135–145)
Total Bilirubin: 1.4 mg/dL — ABNORMAL HIGH (ref 0.3–1.2)
Total Protein: 6.4 g/dL — ABNORMAL LOW (ref 6.5–8.1)

## 2021-06-25 LAB — LIPASE, BLOOD: Lipase: 35 U/L (ref 11–51)

## 2021-06-25 NOTE — Consult Note (Signed)
Infectious Disease Consultation   Benjamin Barnett  DGL:[id number]  DOB: September 13, 1946  DOA: 05/28/2021  Requesting physician: Dr. Sharyon Medicus  Reason for consultation: Antibiotic Recommendations  History of Present Illness: Benjamin Barnett is an 75 y.o. male who has a past medical history significant for hypertension, hyperlipidemia, kidney stones, gout, seizure.  He was admitted to the acute facility for altered mental status thought to be secondary to postictal state.  He was initially admitted to the ICU on 05/15/2019 to home.  On 05/17/2021 patient apparently had a cardiac arrest and was emergently intubated.  He was in PEA and return to spontaneous circulation after a round of epinephrine.  He was extubated on 05/20/2021 and was placed on BiPAP.  He developed pneumonia with MRSA and received treatment with IV vancomycin.  He had agitation and remained on Precedex drip which was eventually tapered off.  Patient was started on Seroquel.  Due to his complex medical problems he was transferred and admitted to [facility].  After admission here he started having fevers and swelling and pain of the parotid gland.  MRI of the brain per report mild diffuse ventricular prominence, difficult to exclude NPH, diffuse soft tissue swelling involving the left face and posterior upper neck with involvement of the left parotid gland concerning for acute parotitis.  He also had UTI with Morganella and treated with IV ciprofloxacin.  Patient had retroperitoneal hemorrhage.  Also had acute DVT of the right upper extremity.  He had other medical problems including MRSA pneumonia status post treatment with IV vancomycin.  Review of Systems:  He is very confused.  Has hand mittens.  Unable to obtain review of systems at this time.  Past Medical History: Gout, hypertension, hyperlipidemia  Past Surgical History: PEG tube placement  Allergies:  No Known Allergies  Social History:  has no history  on file for tobacco use, alcohol use, and drug use.  Family History: Parents deceased  Physical Exam: Vitals: Temperature 100.4, pulse 105, respiratory 21, blood pressure 135/73, pulse oximetry 97%  Constitutional: Ill-appearing male, confused Head: Atraumatic, normocephalic Eyes: PERLA, EOMI, irises appear normal, anicteric sclera  ENMT: external ears and nose appear normal, Lips appears normal, moist oral mucosa, has swelling and erythema, tenderness of the left face and neck area  Neck: Swelling on the left side of the face and neck area CVS: S1-S2  Respiratory: Rhonchi, no wheezing Abdomen: soft nontender, nondistended, positive bowel sounds  Musculoskeletal: No edema Neuro: Confused, not following commands.  Unable to do a neurologic exam at this time Psych: Confused, agitated Skin: Has swelling, erythema of the left side of the face and neck, no other rashes  Data reviewed:  I have personally reviewed following labs and imaging studies Labs:  CBC: Recent Labs  Lab 06/19/21 0619 06/20/21 0214 06/20/21 0600 06/20/21 1724 06/21/21 0318 06/22/21 0609 06/23/21 0420 06/25/21 0250  WBC 6.6 7.4  --   --   --  8.3 9.1 10.2  HGB 7.8* 8.1*   < > 7.8* 7.8* 8.8* 8.6* 8.9*  HCT 23.7* 25.3*   < > 23.2* 24.4* 27.5* 27.3* 28.1*  MCV 91.5 92.0  --   --   --  92.3 93.2 92.4  PLT 233 307  --   --   --  314 293 317   < > = values in this interval not displayed.    Basic Metabolic Panel: Recent Labs  Lab 06/19/21 [phone]  06/22/21 0609 06/25/21 0250  NA 132* 134* 136  K 3.5 3.5 3.5  CL 101 100 103  CO2 27 26 27   GLUCOSE 104* 104* 119*  BUN 14 9 18   CREATININE 0.87 0.92 0.95  CALCIUM 9.7 9.6 9.2  MG 1.7  --   --    GFR CrCl cannot be calculated (Unknown ideal weight.). Liver Function Tests: Recent Labs  Lab 06/25/21 0250  AST 22  ALT 14  ALKPHOS 51  BILITOT 1.4*  PROT 6.4*  ALBUMIN 2.5*   Recent Labs  Lab 06/25/21 0250  LIPASE 35   No results for input(s):  AMMONIA in the last 168 hours. Coagulation profile Recent Labs  Lab 06/21/21 0318 06/22/21 0609 06/23/21 0420 06/24/21 0544 06/25/21 0250  INR 2.0* 2.1* 2.0* 1.8* 1.6*    Cardiac Enzymes: No results for input(s): CKTOTAL, CKMB, CKMBINDEX, TROPONINI in the last 168 hours. BNP: Invalid input(s): POCBNP CBG: No results for input(s): GLUCAP in the last 168 hours. D-Dimer No results for input(s): DDIMER in the last 72 hours. Hgb A1c No results for input(s): HGBA1C in the last 72 hours. Lipid Profile No results for input(s): CHOL, HDL, LDLCALC, TRIG, CHOLHDL, LDLDIRECT in the last 72 hours. Thyroid function studies No results for input(s): TSH, T4TOTAL, T3FREE, THYROIDAB in the last 72 hours.  Invalid input(s): FREET3 Anemia work up No results for input(s): VITAMINB12, FOLATE, FERRITIN, TIBC, IRON, RETICCTPCT in the last 72 hours. Urinalysis    Component Value Date/Time   COLORURINE YELLOW 06/14/2021 1537   APPEARANCEUR CLEAR 06/14/2021 1537   LABSPEC 1.006 06/14/2021 1537   PHURINE 6.0 06/14/2021 1537   GLUCOSEU NEGATIVE 06/14/2021 1537   HGBUR LARGE (A) 06/14/2021 1537   BILIRUBINUR NEGATIVE 06/14/2021 1537   KETONESUR NEGATIVE 06/14/2021 1537   PROTEINUR NEGATIVE 06/14/2021 1537   NITRITE NEGATIVE 06/14/2021 1537   LEUKOCYTESUR TRACE (A) 06/14/2021 1537     Sepsis Labs Invalid input(s): PROCALCITONIN,  WBC,  LACTICIDVEN Microbiology No results found for this or any previous visit (from the past 240 hour(s)).   Inpatient Medications:   Please see MAR  Radiological Exams on Admission: MR BRAIN WO CONTRAST  Result Date: 06/25/2021 CLINICAL DATA:  Initial evaluation for hydrocephalus. EXAM: MRI HEAD WITHOUT CONTRAST TECHNIQUE: Multiplanar, multiecho pulse sequences of the brain and surrounding structures were obtained without intravenous contrast. COMPARISON:  Prior CT from 06/23/2021. FINDINGS: Brain: Examination moderately to severely degraded by motion artifact.  Diffuse prominence of the CSF containing spaces compatible with generalized age-related cerebral atrophy. No significant cerebral white matter disease seen on this motion degraded exam. No abnormal foci of restricted diffusion to suggest acute or subacute ischemia. Gray-white matter differentiation maintained. No encephalomalacia to suggest chronic cortical infarction. No definite foci of susceptibility artifact to suggest acute or chronic intracranial hemorrhage. No mass lesion, midline shift or mass effect. Mild diffuse ventricular prominence, felt to most likely be related to underlying parenchymal volume loss. A degree of NPH is difficult to exclude, and could be considered in the correct clinical setting. No visible transependymal flow of CSF. No extra-axial fluid collection. Pituitary gland suprasellar region within normal limits. Midline structures intact and normal. Vascular: Major intracranial vascular flow voids are grossly maintained at the skull base. Skull and upper cervical spine: Craniocervical junction within normal limits. Bone marrow signal intensity normal. No scalp soft tissue abnormality. Sinuses/Orbits: Patient status post bilateral ocular lens replacement. Globes and orbital soft tissues demonstrate no acute finding. Paranasal sinuses are largely clear. No significant mastoid effusion.  Other: There is diffuse soft tissue swelling seen involving the partially visualized left face and posterior upper neck, with involvement of the partially visualized left parotid gland. Findings raise the possibility for acute parotitis, incompletely assessed on this exam. No visible collections, ductal dilatation, or other focal abnormality. IMPRESSION: 1. Technically limited exam due to extensive motion artifact. 2. No acute intracranial abnormality. 3. Mild diffuse ventricular prominence, felt to most likely be related to underlying parenchymal volume loss. A degree of NPH is difficult to exclude, and could  be considered in the correct clinical setting. Correlation with LP with opening pressure could be performed for further evaluation as warranted. 4. Diffuse soft tissue swelling involving the partially visualized left face and posterior upper neck, with involvement of the partially visualized left parotid gland. Findings raise the possibility for acute parotitis, incompletely assessed on this exam. Correlation with physical exam recommended. Electronically Signed   By: Rise Mu M.D.   On: 06/25/2021 01:49    Impression/Recommendations Active Problems: Acute parotitis UTI with Morganella Morgani Pneumonia with MRSA Encephalopathy Acute respiratory failure Status post cardiopulmonary arrest, PEA arrest Acute kidney injury Dysphagia/protein calorie malnutrition  Acute parotitis: Patient noted to have acute parotitis with swelling, tenderness of the left parotid area and the neck.  Continue treatment with IV vancomycin, Unasyn.  Continue to monitor closely.  Plan to treat for tentative duration of 2 weeks with the Unasyn for the parotitis.  Suggest to also check blood cultures.  Please monitor BUN/cr closely while on antibiotics.  Also monitor CBC.  UTI: He had UTI with Morganella.  He received treatment with IV ciprofloxacin.  He is high risk for recurrent UTI.  Currently on antibiotics as mentioned above for the acute parotitis.  Pneumonia: He had respiratory cultures that showed MRSA.  On treatment with vancomycin.  Recommend to treat for duration of 1 week pending improvement.  He also has dysphagia and high risk for recurrent aspiration and aspiration pneumonia.  Now on antibiotics as mentioned above.  If his respite status worsens would recommend to repeat chest imaging preferably chest CT that can be done without contrast if concern for renal compromise.  Encephalopathy: Likely toxic/metabolic.  MRI report as mentioned above.  He was noted to have underlying parenchymal volume loss.   Antibiotics as mentioned above.  Continue supportive management.  Further investigation and management per the primary team.  Acute respiratory failure: Status post PEA arrest and intubation.  He is currently on oxygen by nasal cannula.  Also has pneumonia.  On antibiotics as mentioned above.  Continue further management per the primary team.  Status post PEA arrest: Continue medications and management per the primary team.  Acute kidney injury: Please monitor BUN/creatinine closely while on antibiotics.  Avoid nephrotoxic medications.  Further management per the primary team.  Dysphagia/protein calorie malnutrition: Due to his dysphagia he is high risk for aspiration and recurrent aspiration pneumonia.  Management of PCM per the primary team.  Unfortunately due to his complex medical problems he is high risk for worsening and decompensation.  Plan of care discussed with the primary team and pharmacy.  Thank you for this consultation.    Vonzella Nipple M.D. 06/25/2021, 6:31 PM

## 2021-06-26 LAB — BASIC METABOLIC PANEL
Anion gap: 6 (ref 5–15)
BUN: 19 mg/dL (ref 8–23)
CO2: 26 mmol/L (ref 22–32)
Calcium: 9.3 mg/dL (ref 8.9–10.3)
Chloride: 104 mmol/L (ref 98–111)
Creatinine, Ser: 0.84 mg/dL (ref 0.61–1.24)
GFR, Estimated: >60 mL/min (ref >60)
Glucose, Bld: 116 mg/dL — ABNORMAL HIGH (ref 70–99)
Potassium: 3.7 mmol/L (ref 3.5–5.1)
Sodium: 136 mmol/L (ref 135–145)

## 2021-06-26 LAB — VANCOMYCIN, TROUGH: Vancomycin Tr: 17 ug/mL (ref 15–20)

## 2021-06-26 LAB — PROTIME-INR
INR: 1.5 — ABNORMAL HIGH (ref 0.8–1.2)
Prothrombin Time: 18.4 seconds — ABNORMAL HIGH (ref 11.4–15.2)

## 2021-06-27 LAB — PROTIME-INR
INR: 1.3 — ABNORMAL HIGH (ref 0.8–1.2)
Prothrombin Time: 16.4 seconds — ABNORMAL HIGH (ref 11.4–15.2)

## 2021-06-28 LAB — CBC
HCT: 31.8 % — ABNORMAL LOW (ref 39.0–52.0)
Hemoglobin: 9.8 g/dL — ABNORMAL LOW (ref 13.0–17.0)
MCH: 28.4 pg (ref 26.0–34.0)
MCHC: 30.8 g/dL (ref 30.0–36.0)
MCV: 92.2 fL (ref 80.0–100.0)
Platelets: 357 10*3/uL (ref 150–400)
RBC: 3.45 MIL/uL — ABNORMAL LOW (ref 4.22–5.81)
RDW: 14.5 % (ref 11.5–15.5)
WBC: 8.3 10*3/uL (ref 4.0–10.5)
nRBC: 0 % (ref 0.0–0.2)

## 2021-06-28 LAB — BASIC METABOLIC PANEL
Anion gap: 10 (ref 5–15)
BUN: 19 mg/dL (ref 8–23)
CO2: 27 mmol/L (ref 22–32)
Calcium: 9.1 mg/dL (ref 8.9–10.3)
Chloride: 99 mmol/L (ref 98–111)
Creatinine, Ser: 0.83 mg/dL (ref 0.61–1.24)
GFR, Estimated: >60 mL/min (ref >60)
Glucose, Bld: 136 mg/dL — ABNORMAL HIGH (ref 70–99)
Potassium: 3.4 mmol/L — ABNORMAL LOW (ref 3.5–5.1)
Sodium: 136 mmol/L (ref 135–145)

## 2021-06-28 LAB — POTASSIUM: Potassium: 4 mmol/L (ref 3.5–5.1)

## 2021-06-28 LAB — PROTIME-INR
INR: 1.2 (ref 0.8–1.2)
Prothrombin Time: 15.6 seconds — ABNORMAL HIGH (ref 11.4–15.2)

## 2021-06-29 LAB — POTASSIUM: Potassium: 3.9 mmol/L (ref 3.5–5.1)

## 2021-06-29 LAB — PROTIME-INR
INR: 1.2 (ref 0.8–1.2)
Prothrombin Time: 15.6 seconds — ABNORMAL HIGH (ref 11.4–15.2)

## 2021-06-30 LAB — CULTURE, BLOOD (ROUTINE X 2)
Culture: NO GROWTH
Culture: NO GROWTH
Special Requests: ADEQUATE
Special Requests: ADEQUATE

## 2021-06-30 LAB — PROTIME-INR
INR: 1.2 (ref 0.8–1.2)
Prothrombin Time: 15 seconds (ref 11.4–15.2)

## 2021-07-01 LAB — CBC
HCT: 29.4 % — ABNORMAL LOW (ref 39.0–52.0)
Hemoglobin: 9.3 g/dL — ABNORMAL LOW (ref 13.0–17.0)
MCH: 28.7 pg (ref 26.0–34.0)
MCHC: 31.6 g/dL (ref 30.0–36.0)
MCV: 90.7 fL (ref 80.0–100.0)
Platelets: 314 10*3/uL (ref 150–400)
RBC: 3.24 MIL/uL — ABNORMAL LOW (ref 4.22–5.81)
RDW: 14.7 % (ref 11.5–15.5)
WBC: 6.6 10*3/uL (ref 4.0–10.5)
nRBC: 0 % (ref 0.0–0.2)

## 2021-07-01 LAB — BASIC METABOLIC PANEL
Anion gap: 6 (ref 5–15)
BUN: 15 mg/dL (ref 8–23)
CO2: 27 mmol/L (ref 22–32)
Calcium: 9.3 mg/dL (ref 8.9–10.3)
Chloride: 101 mmol/L (ref 98–111)
Creatinine, Ser: 0.86 mg/dL (ref 0.61–1.24)
GFR, Estimated: >60 mL/min (ref >60)
Glucose, Bld: 110 mg/dL — ABNORMAL HIGH (ref 70–99)
Potassium: 3.9 mmol/L (ref 3.5–5.1)
Sodium: 134 mmol/L — ABNORMAL LOW (ref 135–145)

## 2021-07-01 LAB — MAGNESIUM: Magnesium: 1.5 mg/dL — ABNORMAL LOW (ref 1.7–2.4)

## 2021-07-01 LAB — PROTIME-INR
INR: 1.2 (ref 0.8–1.2)
Prothrombin Time: 14.9 seconds (ref 11.4–15.2)

## 2021-07-01 LAB — VANCOMYCIN, TROUGH: Vancomycin Tr: 19 ug/mL (ref 15–20)

## 2021-07-01 LAB — PHOSPHORUS: Phosphorus: 4.2 mg/dL (ref 2.5–4.6)

## 2021-07-02 LAB — MAGNESIUM: Magnesium: 2 mg/dL (ref 1.7–2.4)

## 2021-07-02 LAB — PROTIME-INR
INR: 1.2 (ref 0.8–1.2)
Prothrombin Time: 14.8 seconds (ref 11.4–15.2)

## 2021-07-03 LAB — PROTIME-INR
INR: 1.1 (ref 0.8–1.2)
Prothrombin Time: 14.3 seconds (ref 11.4–15.2)

## 2021-07-04 LAB — PROTIME-INR
INR: 1.2 (ref 0.8–1.2)
Prothrombin Time: 15.6 seconds — ABNORMAL HIGH (ref 11.4–15.2)

## 2021-07-05 LAB — VANCOMYCIN, TROUGH: Vancomycin Tr: 32 ug/mL (ref 15–20)

## 2021-07-05 LAB — PROTIME-INR
INR: 1.2 (ref 0.8–1.2)
Prothrombin Time: 14.9 seconds (ref 11.4–15.2)

## 2021-07-05 LAB — BASIC METABOLIC PANEL
Anion gap: 8 (ref 5–15)
BUN: 16 mg/dL (ref 8–23)
CO2: 26 mmol/L (ref 22–32)
Calcium: 9.6 mg/dL (ref 8.9–10.3)
Chloride: 102 mmol/L (ref 98–111)
Creatinine, Ser: 1.08 mg/dL (ref 0.61–1.24)
GFR, Estimated: >60 mL/min (ref >60)
Glucose, Bld: 117 mg/dL — ABNORMAL HIGH (ref 70–99)
Potassium: 3.6 mmol/L (ref 3.5–5.1)
Sodium: 136 mmol/L (ref 135–145)

## 2021-07-05 LAB — CBC
HCT: 28.3 % — ABNORMAL LOW (ref 39.0–52.0)
Hemoglobin: 8.8 g/dL — ABNORMAL LOW (ref 13.0–17.0)
MCH: 28.7 pg (ref 26.0–34.0)
MCHC: 31.1 g/dL (ref 30.0–36.0)
MCV: 92.2 fL (ref 80.0–100.0)
Platelets: 283 10*3/uL (ref 150–400)
RBC: 3.07 MIL/uL — ABNORMAL LOW (ref 4.22–5.81)
RDW: 15.8 % — ABNORMAL HIGH (ref 11.5–15.5)
WBC: 7 10*3/uL (ref 4.0–10.5)
nRBC: 0 % (ref 0.0–0.2)

## 2021-07-05 LAB — MAGNESIUM: Magnesium: 1.7 mg/dL (ref 1.7–2.4)

## 2021-07-05 NOTE — Progress Notes (Signed)
PROGRESS NOTE    Benjamin Barnett  ZOX:[id number] DOB: Nov 14, 1946 DOA: 05/28/2021  Brief Narrative:  Benjamin Barnett is an 75 y.o. male who has a past medical history significant for hypertension, hyperlipidemia, kidney stones, gout, seizure.  He was admitted to the acute facility for altered mental status thought to be secondary to postictal state.  He was initially admitted to the ICU on 05/15/2019 to home.  On 05/17/2021 patient apparently had a cardiac arrest and was emergently intubated.  He was in PEA and return to spontaneous circulation after a round of epinephrine.  He was extubated on 05/20/2021 and was placed on BiPAP.  He developed pneumonia with MRSA and received treatment with IV vancomycin.  He had agitation and remained on Precedex drip which was eventually tapered off.  Patient was started on Seroquel.  Due to his complex medical problems he was transferred and admitted to [facility].  After admission here he started having fevers and swelling and pain of the parotid gland.  MRI of the brain per report mild diffuse ventricular prominence, difficult to exclude NPH, diffuse soft tissue swelling involving the left face and posterior upper neck with involvement of the left parotid gland concerning for acute parotitis.  He also had UTI with Morganella and treated with IV ciprofloxacin.  Patient had retroperitoneal hemorrhage.  Also had acute DVT of the right upper extremity.  He had other medical problems including MRSA pneumonia status post treatment with IV vancomycin.  He remains very confused.  He had a disseminated rash.  He has been started on IV acyclovir for probable herpes zoster.   Assessment & Plan: Active Problems: Acute parotitis UTI with Morganella Morgani Pneumonia with MRSA Disseminated rash, probable herpes zoster  Encephalopathy Acute respiratory failure Status post cardiopulmonary arrest, PEA arrest Acute kidney injury Dysphagia/protein calorie  malnutrition   Acute parotitis: Patient noted to have acute parotitis with swelling, tenderness of the left parotid area and the neck.  Continue treatment with IV Unasyn.  Continue to monitor closely.  Plan to treat for tentative duration of 2 weeks with the Unasyn for the parotitis with tentative end date 07/08/2021.  If he is not improving can extend for another week.  If he starts having worsening leukocytosis or fevers greater than 101 suggest to also check blood cultures.  Please monitor BUN/cr closely while on antibiotics.  Also monitor CBC.   UTI: He had UTI with Morganella.  He received treatment with IV ciprofloxacin.  He is high risk for recurrent UTI.  Currently on antibiotics as mentioned above for the acute parotitis.   Pneumonia: He had respiratory cultures that showed MRSA.  Received treatment with IV vancomycin. He also has dysphagia and high risk for recurrent aspiration and aspiration pneumonia. Antibiotics as mentioned above.  If his respiratory status worsens would recommend to repeat chest imaging preferably chest CT that can be done without contrast if concern for renal compromise.  Rash: He had a disseminated rash thought to be herpes zoster.  On treatment with acyclovir.  Improving   Encephalopathy: Likely toxic/metabolic.  MRI report as mentioned above.  He was noted to have underlying parenchymal volume loss.  Antibiotics as mentioned above.  He continues to be confused. Continue supportive management.  Further investigation and management per the primary team.   Acute respiratory failure: Status post PEA arrest and intubation.  He is currently on oxygen by nasal cannula.  Also has pneumonia.  On antibiotics as mentioned above.  Continue further management  per the primary team.   Status post PEA arrest: Continue medications and management per the primary team.   Acute kidney injury: Please monitor BUN/creatinine closely while on antibiotics.  Avoid nephrotoxic medications.   Further management per the primary team.   Dysphagia/protein calorie malnutrition: Due to his dysphagia he is high risk for aspiration and recurrent aspiration pneumonia.  Management of PCM per the primary team.   Unfortunately due to his complex medical problems he is high risk for worsening and decompensation.  Plan of care discussed with the primary team and pharmacy.   Subjective: He is opening eyes but remains very confused.  Erythema, swelling and tenderness of the left face and neck area improving.  Objective: Vitals: Temperature 99.8, pulse 100, respiratory 28, blood pressure 150/87, pulse oximetry 97%  Examination: Constitutional: Ill-appearing male, confused Head: Atraumatic, normocephalic Eyes: PERLA, EOMI, irises appear normal, anicteric sclera ENMT: external ears and nose appear normal, Lips appears normal, moist oral mucosa, has swelling and erythema, tenderness of the left face and neck area  Neck: Swelling on the left side of the face and neck area CVS: S1-S2 Respiratory: Rhonchi, no wheezing Abdomen: soft nontender, nondistended, positive bowel sounds  Musculoskeletal: No edema Neuro: Confused, not following commands.  Unable to do a neurologic exam at this time Psych: Confused, agitated Skin: Has swelling, erythema of the left side of the face and neck, no other rashes    Data Reviewed: I have personally reviewed following labs and imaging studies  CBC: Recent Labs  Lab 07/01/21 0541 07/05/21 0432  WBC 6.6 7.0  HGB 9.3* 8.8*  HCT 29.4* 28.3*  MCV 90.7 92.2  PLT 314 283    Basic Metabolic Panel: Recent Labs  Lab 06/29/21 0854 07/01/21 0541 07/02/21 0708 07/05/21 0432  NA  --  134*  --  136  K 3.9 3.9  --  3.6  CL  --  101  --  102  CO2  --  27  --  26  GLUCOSE  --  110*  --  117*  BUN  --  15  --  16  CREATININE  --  0.86  --  1.08  CALCIUM  --  9.3  --  9.6  MG  --  1.5* 2.0 1.7  PHOS  --  4.2  --   --     GFR: CrCl cannot be calculated  (Unknown ideal weight.).  Liver Function Tests: No results for input(s): AST, ALT, ALKPHOS, BILITOT, PROT, ALBUMIN in the last 168 hours.  CBG: No results for input(s): GLUCAP in the last 168 hours.   No results found for this or any previous visit (from the past 240 hour(s)).   Radiology Studies: No results found.   Scheduled Meds: Please see MAR   Vonzella Nipple, MD 07/05/2021, 11:27 PM

## 2021-07-06 ENCOUNTER — Other Ambulatory Visit (HOSPITAL_COMMUNITY): Payer: Medicare Other

## 2021-07-06 LAB — URINALYSIS, ROUTINE W REFLEX MICROSCOPIC
Bilirubin Urine: NEGATIVE
Glucose, UA: NEGATIVE mg/dL
Hgb urine dipstick: NEGATIVE
Ketones, ur: NEGATIVE mg/dL
Leukocytes,Ua: NEGATIVE
Nitrite: NEGATIVE
Protein, ur: NEGATIVE mg/dL
Specific Gravity, Urine: 1.015 (ref 1.005–1.030)
pH: 7 (ref 5.0–8.0)

## 2021-07-06 LAB — CBC
HCT: 29.5 % — ABNORMAL LOW (ref 39.0–52.0)
Hemoglobin: 9.1 g/dL — ABNORMAL LOW (ref 13.0–17.0)
MCH: 28.6 pg (ref 26.0–34.0)
MCHC: 30.8 g/dL (ref 30.0–36.0)
MCV: 92.8 fL (ref 80.0–100.0)
Platelets: 282 10*3/uL (ref 150–400)
RBC: 3.18 MIL/uL — ABNORMAL LOW (ref 4.22–5.81)
RDW: 16.2 % — ABNORMAL HIGH (ref 11.5–15.5)
WBC: 8.6 10*3/uL (ref 4.0–10.5)
nRBC: 0 % (ref 0.0–0.2)

## 2021-07-06 LAB — PROTIME-INR
INR: 1.2 (ref 0.8–1.2)
Prothrombin Time: 15.3 seconds — ABNORMAL HIGH (ref 11.4–15.2)

## 2021-07-06 LAB — BASIC METABOLIC PANEL
Anion gap: 9 (ref 5–15)
BUN: 19 mg/dL (ref 8–23)
CO2: 28 mmol/L (ref 22–32)
Calcium: 9.6 mg/dL (ref 8.9–10.3)
Chloride: 99 mmol/L (ref 98–111)
Creatinine, Ser: 1.13 mg/dL (ref 0.61–1.24)
GFR, Estimated: >60 mL/min (ref >60)
Glucose, Bld: 130 mg/dL — ABNORMAL HIGH (ref 70–99)
Potassium: 4 mmol/L (ref 3.5–5.1)
Sodium: 136 mmol/L (ref 135–145)

## 2021-07-06 LAB — VANCOMYCIN, TROUGH: Vancomycin Tr: 17 ug/mL (ref 15–20)

## 2021-07-06 LAB — MAGNESIUM: Magnesium: 2 mg/dL (ref 1.7–2.4)

## 2021-07-07 LAB — PROTIME-INR
INR: 1.2 (ref 0.8–1.2)
Prothrombin Time: 15.5 seconds — ABNORMAL HIGH (ref 11.4–15.2)

## 2021-07-07 LAB — URINE CULTURE: Culture: NO GROWTH

## 2021-07-08 LAB — PROTIME-INR
INR: 1.2 (ref 0.8–1.2)
Prothrombin Time: 14.8 seconds (ref 11.4–15.2)

## 2021-07-09 LAB — MAGNESIUM: Magnesium: 1.9 mg/dL (ref 1.7–2.4)

## 2021-07-09 LAB — CBC
HCT: 31.6 % — ABNORMAL LOW (ref 39.0–52.0)
Hemoglobin: 9.8 g/dL — ABNORMAL LOW (ref 13.0–17.0)
MCH: 28.8 pg (ref 26.0–34.0)
MCHC: 31 g/dL (ref 30.0–36.0)
MCV: 92.9 fL (ref 80.0–100.0)
Platelets: 294 10*3/uL (ref 150–400)
RBC: 3.4 MIL/uL — ABNORMAL LOW (ref 4.22–5.81)
RDW: 16.4 % — ABNORMAL HIGH (ref 11.5–15.5)
WBC: 7.5 10*3/uL (ref 4.0–10.5)
nRBC: 0 % (ref 0.0–0.2)

## 2021-07-09 LAB — BASIC METABOLIC PANEL
Anion gap: 10 (ref 5–15)
BUN: 25 mg/dL — ABNORMAL HIGH (ref 8–23)
CO2: 26 mmol/L (ref 22–32)
Calcium: 10.3 mg/dL (ref 8.9–10.3)
Chloride: 101 mmol/L (ref 98–111)
Creatinine, Ser: 1.15 mg/dL (ref 0.61–1.24)
GFR, Estimated: >60 mL/min (ref >60)
Glucose, Bld: 122 mg/dL — ABNORMAL HIGH (ref 70–99)
Potassium: 3.4 mmol/L — ABNORMAL LOW (ref 3.5–5.1)
Sodium: 137 mmol/L (ref 135–145)

## 2021-07-09 LAB — PROTIME-INR
INR: 1.2 (ref 0.8–1.2)
Prothrombin Time: 14.8 seconds (ref 11.4–15.2)

## 2021-07-10 LAB — PROTIME-INR
INR: 1.1 (ref 0.8–1.2)
Prothrombin Time: 14.6 seconds (ref 11.4–15.2)

## 2021-07-10 LAB — POTASSIUM: Potassium: 4 mmol/L (ref 3.5–5.1)

## 2021-07-11 LAB — PROTIME-INR
INR: 1.1 (ref 0.8–1.2)
Prothrombin Time: 14.6 seconds (ref 11.4–15.2)

## 2021-07-12 LAB — CBC
HCT: 35.2 % — ABNORMAL LOW (ref 39.0–52.0)
Hemoglobin: 10.7 g/dL — ABNORMAL LOW (ref 13.0–17.0)
MCH: 28.4 pg (ref 26.0–34.0)
MCHC: 30.4 g/dL (ref 30.0–36.0)
MCV: 93.4 fL (ref 80.0–100.0)
Platelets: 291 10*3/uL (ref 150–400)
RBC: 3.77 MIL/uL — ABNORMAL LOW (ref 4.22–5.81)
RDW: 16.7 % — ABNORMAL HIGH (ref 11.5–15.5)
WBC: 7.7 10*3/uL (ref 4.0–10.5)
nRBC: 0 % (ref 0.0–0.2)

## 2021-07-12 LAB — BASIC METABOLIC PANEL
Anion gap: 7 (ref 5–15)
BUN: 37 mg/dL — ABNORMAL HIGH (ref 8–23)
CO2: 28 mmol/L (ref 22–32)
Calcium: 10.4 mg/dL — ABNORMAL HIGH (ref 8.9–10.3)
Chloride: 103 mmol/L (ref 98–111)
Creatinine, Ser: 1.2 mg/dL (ref 0.61–1.24)
GFR, Estimated: >60 mL/min (ref >60)
Glucose, Bld: 120 mg/dL — ABNORMAL HIGH (ref 70–99)
Potassium: 3.8 mmol/L (ref 3.5–5.1)
Sodium: 138 mmol/L (ref 135–145)

## 2021-07-12 LAB — PROTIME-INR
INR: 1.1 (ref 0.8–1.2)
Prothrombin Time: 14.2 seconds (ref 11.4–15.2)

## 2021-07-13 LAB — PROTIME-INR
INR: 1.1 (ref 0.8–1.2)
Prothrombin Time: 14.4 seconds (ref 11.4–15.2)

## 2021-07-14 LAB — PROTIME-INR
INR: 1.2 (ref 0.8–1.2)
Prothrombin Time: 15.1 seconds (ref 11.4–15.2)

## 2021-07-15 LAB — PROTIME-INR
INR: 1.2 (ref 0.8–1.2)
Prothrombin Time: 14.9 seconds (ref 11.4–15.2)

## 2021-07-16 LAB — BASIC METABOLIC PANEL
Anion gap: 10 (ref 5–15)
BUN: 32 mg/dL — ABNORMAL HIGH (ref 8–23)
CO2: 26 mmol/L (ref 22–32)
Calcium: 11 mg/dL — ABNORMAL HIGH (ref 8.9–10.3)
Chloride: 102 mmol/L (ref 98–111)
Creatinine, Ser: 1.26 mg/dL — ABNORMAL HIGH (ref 0.61–1.24)
GFR, Estimated: >60 mL/min (ref >60)
Glucose, Bld: 101 mg/dL — ABNORMAL HIGH (ref 70–99)
Potassium: 3.5 mmol/L (ref 3.5–5.1)
Sodium: 138 mmol/L (ref 135–145)

## 2021-07-16 LAB — CBC
HCT: 35.5 % — ABNORMAL LOW (ref 39.0–52.0)
Hemoglobin: 10.9 g/dL — ABNORMAL LOW (ref 13.0–17.0)
MCH: 28.8 pg (ref 26.0–34.0)
MCHC: 30.7 g/dL (ref 30.0–36.0)
MCV: 93.9 fL (ref 80.0–100.0)
Platelets: 243 10*3/uL (ref 150–400)
RBC: 3.78 MIL/uL — ABNORMAL LOW (ref 4.22–5.81)
RDW: 17 % — ABNORMAL HIGH (ref 11.5–15.5)
WBC: 5.7 10*3/uL (ref 4.0–10.5)
nRBC: 0 % (ref 0.0–0.2)

## 2021-07-16 LAB — PROTIME-INR
INR: 1.1 (ref 0.8–1.2)
Prothrombin Time: 13.8 seconds (ref 11.4–15.2)

## 2021-07-17 LAB — PROTIME-INR
INR: 1.1 (ref 0.8–1.2)
Prothrombin Time: 14.1 seconds (ref 11.4–15.2)

## 2021-07-18 LAB — BASIC METABOLIC PANEL
Anion gap: 10 (ref 5–15)
BUN: 32 mg/dL — ABNORMAL HIGH (ref 8–23)
CO2: 25 mmol/L (ref 22–32)
Calcium: 10.8 mg/dL — ABNORMAL HIGH (ref 8.9–10.3)
Chloride: 106 mmol/L (ref 98–111)
Creatinine, Ser: 1.27 mg/dL — ABNORMAL HIGH (ref 0.61–1.24)
GFR, Estimated: >60 mL/min (ref >60)
Glucose, Bld: 101 mg/dL — ABNORMAL HIGH (ref 70–99)
Potassium: 3.4 mmol/L — ABNORMAL LOW (ref 3.5–5.1)
Sodium: 141 mmol/L (ref 135–145)

## 2021-07-18 LAB — PROTIME-INR
INR: 1.1 (ref 0.8–1.2)
Prothrombin Time: 13.8 seconds (ref 11.4–15.2)

## 2021-07-19 LAB — PROTIME-INR
INR: 1 (ref 0.8–1.2)
Prothrombin Time: 13.3 seconds (ref 11.4–15.2)

## 2021-07-20 LAB — BASIC METABOLIC PANEL
Anion gap: 11 (ref 5–15)
BUN: 33 mg/dL — ABNORMAL HIGH (ref 8–23)
CO2: 25 mmol/L (ref 22–32)
Calcium: 10.9 mg/dL — ABNORMAL HIGH (ref 8.9–10.3)
Chloride: 102 mmol/L (ref 98–111)
Creatinine, Ser: 1.29 mg/dL — ABNORMAL HIGH (ref 0.61–1.24)
GFR, Estimated: 59 mL/min — ABNORMAL LOW (ref >60)
Glucose, Bld: 87 mg/dL (ref 70–99)
Potassium: 3.5 mmol/L (ref 3.5–5.1)
Sodium: 138 mmol/L (ref 135–145)

## 2021-07-20 LAB — PROTIME-INR
INR: 1 (ref 0.8–1.2)
Prothrombin Time: 13.6 seconds (ref 11.4–15.2)

## 2021-07-21 LAB — PROTIME-INR
INR: 1.1 (ref 0.8–1.2)
Prothrombin Time: 13.8 seconds (ref 11.4–15.2)

## 2021-07-22 LAB — BASIC METABOLIC PANEL
Anion gap: 5 (ref 5–15)
BUN: 24 mg/dL — ABNORMAL HIGH (ref 8–23)
CO2: 33 mmol/L — ABNORMAL HIGH (ref 22–32)
Calcium: 11.4 mg/dL — ABNORMAL HIGH (ref 8.9–10.3)
Chloride: 105 mmol/L (ref 98–111)
Creatinine, Ser: 1.21 mg/dL (ref 0.61–1.24)
GFR, Estimated: >60 mL/min (ref >60)
Glucose, Bld: 120 mg/dL — ABNORMAL HIGH (ref 70–99)
Potassium: 3.1 mmol/L — ABNORMAL LOW (ref 3.5–5.1)
Sodium: 143 mmol/L (ref 135–145)

## 2021-07-22 LAB — CBC
HCT: 38.8 % — ABNORMAL LOW (ref 39.0–52.0)
Hemoglobin: 12.1 g/dL — ABNORMAL LOW (ref 13.0–17.0)
MCH: 28.7 pg (ref 26.0–34.0)
MCHC: 31.2 g/dL (ref 30.0–36.0)
MCV: 91.9 fL (ref 80.0–100.0)
Platelets: 199 10*3/uL (ref 150–400)
RBC: 4.22 MIL/uL (ref 4.22–5.81)
RDW: 16 % — ABNORMAL HIGH (ref 11.5–15.5)
WBC: 6 10*3/uL (ref 4.0–10.5)
nRBC: 0 % (ref 0.0–0.2)

## 2021-07-22 LAB — SARS CORONAVIRUS 2 (TAT 6-24 HRS): SARS Coronavirus 2: NEGATIVE

## 2021-07-23 ENCOUNTER — Emergency Department (HOSPITAL_COMMUNITY): Payer: Medicare Other

## 2021-07-23 ENCOUNTER — Inpatient Hospital Stay (HOSPITAL_COMMUNITY)
Admission: EM | Admit: 2021-07-23 | Discharge: 2021-08-02 | DRG: 871 | Disposition: A | Discharge status: Skilled Nursing Facility | Payer: Medicare Other | Source: Skilled Nursing Facility | Attending: Internal Medicine | Admitting: Internal Medicine

## 2021-07-23 DIAGNOSIS — R6520 Severe sepsis without septic shock: Secondary | ICD-10-CM | POA: Diagnosis not present

## 2021-07-23 DIAGNOSIS — Z888 Allergy status to other drugs, medicaments and biological substances status: Secondary | ICD-10-CM

## 2021-07-23 DIAGNOSIS — E876 Hypokalemia: Secondary | ICD-10-CM | POA: Diagnosis present

## 2021-07-23 DIAGNOSIS — K661 Hemoperitoneum: Secondary | ICD-10-CM | POA: Diagnosis present

## 2021-07-23 DIAGNOSIS — E782 Mixed hyperlipidemia: Secondary | ICD-10-CM | POA: Diagnosis present

## 2021-07-23 DIAGNOSIS — Z6828 Body mass index (BMI) 28.0-28.9, adult: Secondary | ICD-10-CM

## 2021-07-23 DIAGNOSIS — N182 Chronic kidney disease, stage 2 (mild): Secondary | ICD-10-CM | POA: Diagnosis present

## 2021-07-23 DIAGNOSIS — I700 Atherosclerosis of aorta: Secondary | ICD-10-CM | POA: Diagnosis present

## 2021-07-23 DIAGNOSIS — R0602 Shortness of breath: Secondary | ICD-10-CM

## 2021-07-23 DIAGNOSIS — G9341 Metabolic encephalopathy: Secondary | ICD-10-CM | POA: Diagnosis present

## 2021-07-23 DIAGNOSIS — K219 Gastro-esophageal reflux disease without esophagitis: Secondary | ICD-10-CM | POA: Diagnosis not present

## 2021-07-23 DIAGNOSIS — Z20822 Contact with and (suspected) exposure to covid-19: Secondary | ICD-10-CM | POA: Diagnosis present

## 2021-07-23 DIAGNOSIS — Z8614 Personal history of Methicillin resistant Staphylococcus aureus infection: Secondary | ICD-10-CM | POA: Diagnosis not present

## 2021-07-23 DIAGNOSIS — E872 Acidosis, unspecified: Secondary | ICD-10-CM | POA: Diagnosis present

## 2021-07-23 DIAGNOSIS — Z515 Encounter for palliative care: Secondary | ICD-10-CM | POA: Diagnosis not present

## 2021-07-23 DIAGNOSIS — L0390 Cellulitis, unspecified: Secondary | ICD-10-CM | POA: Diagnosis not present

## 2021-07-23 DIAGNOSIS — E1122 Type 2 diabetes mellitus with diabetic chronic kidney disease: Secondary | ICD-10-CM | POA: Diagnosis present

## 2021-07-23 DIAGNOSIS — Z7189 Other specified counseling: Secondary | ICD-10-CM | POA: Diagnosis not present

## 2021-07-23 DIAGNOSIS — N390 Urinary tract infection, site not specified: Secondary | ICD-10-CM | POA: Diagnosis present

## 2021-07-23 DIAGNOSIS — Z22322 Carrier or suspected carrier of Methicillin resistant Staphylococcus aureus: Secondary | ICD-10-CM

## 2021-07-23 DIAGNOSIS — K1121 Acute sialoadenitis: Secondary | ICD-10-CM | POA: Diagnosis not present

## 2021-07-23 DIAGNOSIS — Z79899 Other long term (current) drug therapy: Secondary | ICD-10-CM

## 2021-07-23 DIAGNOSIS — Z8674 Personal history of sudden cardiac arrest: Secondary | ICD-10-CM

## 2021-07-23 DIAGNOSIS — I129 Hypertensive chronic kidney disease with stage 1 through stage 4 chronic kidney disease, or unspecified chronic kidney disease: Secondary | ICD-10-CM | POA: Diagnosis present

## 2021-07-23 DIAGNOSIS — J9811 Atelectasis: Secondary | ICD-10-CM | POA: Diagnosis present

## 2021-07-23 DIAGNOSIS — N170 Acute kidney failure with tubular necrosis: Secondary | ICD-10-CM | POA: Diagnosis not present

## 2021-07-23 DIAGNOSIS — Z931 Gastrostomy status: Secondary | ICD-10-CM | POA: Diagnosis not present

## 2021-07-23 DIAGNOSIS — I10 Essential (primary) hypertension: Secondary | ICD-10-CM | POA: Diagnosis not present

## 2021-07-23 DIAGNOSIS — I6522 Occlusion and stenosis of left carotid artery: Secondary | ICD-10-CM | POA: Diagnosis present

## 2021-07-23 DIAGNOSIS — I160 Hypertensive urgency: Secondary | ICD-10-CM | POA: Diagnosis present

## 2021-07-23 DIAGNOSIS — Z886 Allergy status to analgesic agent status: Secondary | ICD-10-CM

## 2021-07-23 DIAGNOSIS — L03221 Cellulitis of neck: Secondary | ICD-10-CM | POA: Diagnosis present

## 2021-07-23 DIAGNOSIS — R1310 Dysphagia, unspecified: Secondary | ICD-10-CM | POA: Diagnosis present

## 2021-07-23 DIAGNOSIS — S50312A Abrasion of left elbow, initial encounter: Secondary | ICD-10-CM | POA: Diagnosis present

## 2021-07-23 DIAGNOSIS — R0600 Dyspnea, unspecified: Secondary | ICD-10-CM

## 2021-07-23 DIAGNOSIS — G931 Anoxic brain damage, not elsewhere classified: Secondary | ICD-10-CM | POA: Diagnosis not present

## 2021-07-23 DIAGNOSIS — J9601 Acute respiratory failure with hypoxia: Secondary | ICD-10-CM

## 2021-07-23 DIAGNOSIS — B9562 Methicillin resistant Staphylococcus aureus infection as the cause of diseases classified elsewhere: Secondary | ICD-10-CM | POA: Diagnosis not present

## 2021-07-23 DIAGNOSIS — L89151 Pressure ulcer of sacral region, stage 1: Secondary | ICD-10-CM | POA: Diagnosis present

## 2021-07-23 DIAGNOSIS — G9340 Encephalopathy, unspecified: Secondary | ICD-10-CM | POA: Diagnosis not present

## 2021-07-23 DIAGNOSIS — A419 Sepsis, unspecified organism: Secondary | ICD-10-CM | POA: Diagnosis not present

## 2021-07-23 DIAGNOSIS — Z66 Do not resuscitate: Secondary | ICD-10-CM | POA: Diagnosis not present

## 2021-07-23 DIAGNOSIS — Z86718 Personal history of other venous thrombosis and embolism: Secondary | ICD-10-CM

## 2021-07-23 DIAGNOSIS — Z885 Allergy status to narcotic agent status: Secondary | ICD-10-CM

## 2021-07-23 DIAGNOSIS — L8990 Pressure ulcer of unspecified site, unspecified stage: Secondary | ICD-10-CM | POA: Insufficient documentation

## 2021-07-23 DIAGNOSIS — Z9049 Acquired absence of other specified parts of digestive tract: Secondary | ICD-10-CM

## 2021-07-23 DIAGNOSIS — E46 Unspecified protein-calorie malnutrition: Secondary | ICD-10-CM | POA: Diagnosis present

## 2021-07-23 DIAGNOSIS — M109 Gout, unspecified: Secondary | ICD-10-CM | POA: Diagnosis present

## 2021-07-23 DIAGNOSIS — L03211 Cellulitis of face: Secondary | ICD-10-CM | POA: Diagnosis present

## 2021-07-23 DIAGNOSIS — R4182 Altered mental status, unspecified: Secondary | ICD-10-CM | POA: Diagnosis not present

## 2021-07-23 DIAGNOSIS — Z9911 Dependence on respirator [ventilator] status: Secondary | ICD-10-CM | POA: Diagnosis not present

## 2021-07-23 LAB — RESP PANEL BY RT-PCR (FLU A&B, COVID) ARPGX2
Influenza A by PCR: NEGATIVE
Influenza B by PCR: NEGATIVE
SARS Coronavirus 2 by RT PCR: NEGATIVE

## 2021-07-23 LAB — BASIC METABOLIC PANEL
Anion gap: 7 (ref 5–15)
BUN: 26 mg/dL — ABNORMAL HIGH (ref 8–23)
CO2: 34 mmol/L — ABNORMAL HIGH (ref 22–32)
Calcium: 11.3 mg/dL — ABNORMAL HIGH (ref 8.9–10.3)
Chloride: 104 mmol/L (ref 98–111)
Creatinine, Ser: 1.34 mg/dL — ABNORMAL HIGH (ref 0.61–1.24)
GFR, Estimated: 56 mL/min — ABNORMAL LOW (ref >60)
Glucose, Bld: 121 mg/dL — ABNORMAL HIGH (ref 70–99)
Potassium: 3.2 mmol/L — ABNORMAL LOW (ref 3.5–5.1)
Sodium: 145 mmol/L (ref 135–145)

## 2021-07-23 LAB — URINALYSIS, ROUTINE W REFLEX MICROSCOPIC
Bilirubin Urine: NEGATIVE
Glucose, UA: NEGATIVE mg/dL
Hgb urine dipstick: NEGATIVE
Ketones, ur: NEGATIVE mg/dL
Leukocytes,Ua: NEGATIVE
Nitrite: NEGATIVE
Protein, ur: NEGATIVE mg/dL
Specific Gravity, Urine: 1.013 (ref 1.005–1.030)
pH: 7 (ref 5.0–8.0)

## 2021-07-23 LAB — COMPREHENSIVE METABOLIC PANEL
ALT: 17 U/L (ref 0–44)
AST: 26 U/L (ref 15–41)
Albumin: 3.5 g/dL (ref 3.5–5.0)
Alkaline Phosphatase: 70 U/L (ref 38–126)
Anion gap: 8 (ref 5–15)
BUN: 27 mg/dL — ABNORMAL HIGH (ref 8–23)
CO2: 33 mmol/L — ABNORMAL HIGH (ref 22–32)
Calcium: 11.7 mg/dL — ABNORMAL HIGH (ref 8.9–10.3)
Chloride: 103 mmol/L (ref 98–111)
Creatinine, Ser: 1.46 mg/dL — ABNORMAL HIGH (ref 0.61–1.24)
GFR, Estimated: 51 mL/min — ABNORMAL LOW (ref >60)
Glucose, Bld: 147 mg/dL — ABNORMAL HIGH (ref 70–99)
Potassium: 3.7 mmol/L (ref 3.5–5.1)
Sodium: 144 mmol/L (ref 135–145)
Total Bilirubin: 1 mg/dL (ref 0.3–1.2)
Total Protein: 8.5 g/dL — ABNORMAL HIGH (ref 6.5–8.1)

## 2021-07-23 LAB — CBC WITH DIFFERENTIAL/PLATELET
Abs Immature Granulocytes: 0.08 10*3/uL — ABNORMAL HIGH (ref 0.00–0.07)
Basophils Absolute: 0.1 10*3/uL (ref 0.0–0.1)
Basophils Relative: 0 %
Eosinophils Absolute: 0.1 10*3/uL (ref 0.0–0.5)
Eosinophils Relative: 1 %
HCT: 44.8 % (ref 39.0–52.0)
Hemoglobin: 13.9 g/dL (ref 13.0–17.0)
Immature Granulocytes: 1 %
Lymphocytes Relative: 8 %
Lymphs Abs: 1.3 10*3/uL (ref 0.7–4.0)
MCH: 29.1 pg (ref 26.0–34.0)
MCHC: 31 g/dL (ref 30.0–36.0)
MCV: 93.9 fL (ref 80.0–100.0)
Monocytes Absolute: 1.1 10*3/uL — ABNORMAL HIGH (ref 0.1–1.0)
Monocytes Relative: 7 %
Neutro Abs: 13.4 10*3/uL — ABNORMAL HIGH (ref 1.7–7.7)
Neutrophils Relative %: 83 %
Platelets: 269 10*3/uL (ref 150–400)
RBC: 4.77 MIL/uL (ref 4.22–5.81)
RDW: 16 % — ABNORMAL HIGH (ref 11.5–15.5)
WBC: 16 10*3/uL — ABNORMAL HIGH (ref 4.0–10.5)
nRBC: 0 % (ref 0.0–0.2)

## 2021-07-23 LAB — LACTIC ACID, PLASMA
Lactic Acid, Venous: 2.6 mmol/L (ref 0.5–1.9)
Lactic Acid, Venous: 2.2 mmol/L (ref 0.5–1.9)

## 2021-07-23 LAB — I-STAT CHEM 8, ED
BUN: 35 mg/dL — ABNORMAL HIGH (ref 8–23)
Calcium, Ion: 1.34 mmol/L (ref 1.15–1.40)
Chloride: 106 mmol/L (ref 98–111)
Creatinine, Ser: 1.3 mg/dL — ABNORMAL HIGH (ref 0.61–1.24)
Glucose, Bld: 141 mg/dL — ABNORMAL HIGH (ref 70–99)
HCT: 39 % (ref 39.0–52.0)
Hemoglobin: 13.3 g/dL (ref 13.0–17.0)
Potassium: 4 mmol/L (ref 3.5–5.1)
Sodium: 146 mmol/L — ABNORMAL HIGH (ref 135–145)
TCO2: 34 mmol/L — ABNORMAL HIGH (ref 22–32)

## 2021-07-23 LAB — PROTIME-INR
INR: 1.2 (ref 0.8–1.2)
Prothrombin Time: 14.9 seconds (ref 11.4–15.2)

## 2021-07-23 LAB — APTT: aPTT: 38 seconds — ABNORMAL HIGH (ref 24–36)

## 2021-07-23 MED ORDER — VANCOMYCIN HCL 1250 MG/250ML IV SOLN
1250.0000 mg | INTRAVENOUS | Status: DC
  Filled 2021-07-23: qty 250

## 2021-07-23 MED ORDER — SODIUM CHLORIDE 0.9 % IV SOLN
2.0000 g | Freq: Once | INTRAVENOUS | Status: AC
  Administered 2021-07-23: 2 g via INTRAVENOUS
  Filled 2021-07-23: qty 2

## 2021-07-23 MED ORDER — VANCOMYCIN HCL 2000 MG/400ML IV SOLN
2000.0000 mg | Freq: Once | INTRAVENOUS | Status: AC
  Administered 2021-07-23: 2000 mg via INTRAVENOUS
  Filled 2021-07-23: qty 400

## 2021-07-23 MED ORDER — VANCOMYCIN HCL 1250 MG/250ML IV SOLN
1250.0000 mg | INTRAVENOUS | Status: DC
  Administered 2021-07-24 – 2021-07-25 (×2): 1250 mg via INTRAVENOUS
  Filled 2021-07-23 (×2): qty 250

## 2021-07-23 MED ORDER — METRONIDAZOLE 500 MG/100ML IV SOLN
500.0000 mg | Freq: Once | INTRAVENOUS | Status: AC
  Administered 2021-07-23: 500 mg via INTRAVENOUS
  Filled 2021-07-23: qty 100

## 2021-07-23 MED ORDER — LACTATED RINGERS IV BOLUS (SEPSIS)
1000.0000 mL | Freq: Once | INTRAVENOUS | Status: AC
  Administered 2021-07-23: 1000 mL via INTRAVENOUS

## 2021-07-23 MED ORDER — SODIUM CHLORIDE 0.9 % IV SOLN: 2.0000 g | Freq: Two times a day (BID) | INTRAVENOUS | Status: DC

## 2021-07-23 MED ORDER — ACETAMINOPHEN 160 MG/5ML PO SOLN
650.0000 mg | Freq: Once | ORAL | Status: AC
  Administered 2021-07-23: 650 mg
  Filled 2021-07-23: qty 20.3

## 2021-07-23 MED ORDER — LACTATED RINGERS IV SOLN: INTRAVENOUS | Status: DC

## 2021-07-23 NOTE — ED Provider Notes (Signed)
[facility] EMERGENCY DEPARTMENT Provider Note   CSN: [id number] Arrival date & time: 07/23/21  1930     History No chief complaint on file.   Benjamin Barnett is a 75 y.o. male history of recent PEA arrest, hypertension, recent retroperitoneal hematoma, MRSA pneumonia here presenting with altered mental status.  Patient was recently on Precedex drip for agitation.  Patient had a long complicated admission for cardiac arrest on 6/2.  He was intubated and was in the ICU and eventually was extubated.  Patient then had MRSA pneumonia and also retroperitoneal hemorrhage.  Patient then had a G-tube and then had agitation and was on Precedex drip.  [facility]  Patient was transferred to [facility] health care today and was noted to be altered.  The daughter FaceTime with him and noticed that he was not himself.  The history is provided by the patient.      No past medical history on file.  Patient Active Problem List   Diagnosis Date Noted   Sepsis (HCC) 07/23/2021       No family history on file.     Home Medications Prior to Admission medications   Not on File    Allergies    Patient has no known allergies.  Review of Systems   Review of Systems  Neurological:  Positive for tremors.  Psychiatric/Behavioral:  Positive for confusion.   All other systems reviewed and are negative.  Physical Exam Updated Vital Signs BP (!) 156/121   Pulse (!) 113   Temp (!) 101.1 F (38.4 C) (Rectal)   Resp 19   Ht 5\' 10"  (1.778 m)   Wt 96.6 kg   SpO2 94%   BMI 30.56 kg/m   Physical Exam Vitals and nursing note reviewed.  Constitutional:      Comments: Tremors, nonverbal, unable to answer questions  HENT:     Head: Normocephalic.     Mouth/Throat:     Mouth: Mucous membranes are dry.  Eyes:     Extraocular Movements: Extraocular movements intact.     Pupils: Pupils are equal, round, and reactive to  light.  Cardiovascular:     Rate and Rhythm: Regular rhythm. Tachycardia present.     Pulses: Normal pulses.     Heart sounds: Normal heart sounds.  Pulmonary:     Comments: Diminished bilateral bases Abdominal:     General: Abdomen is flat.     Palpations: Abdomen is soft.     Comments: G tube in place   Musculoskeletal:     Cervical back: Normal range of motion and neck supple.     Comments: Stage 1 sacral decub ulcer.  Abrasion on the left elbow  Skin:    General: Skin is warm.     Capillary Refill: Capillary refill takes less than 2 seconds.  Neurological:     Comments: Patient is nonverbal but responds to pain bilaterally.  Psychiatric:     Comments: Unable     ED Results / Procedures / Treatments   Labs (all labs ordered are listed, but only abnormal results are displayed) Labs Reviewed  LACTIC ACID, PLASMA - Abnormal; Notable for the following components:      Result Value   Lactic Acid, Venous 2.6 (*)    All other components within normal limits  LACTIC ACID, PLASMA - Abnormal; Notable for the following components:   Lactic Acid, Venous 2.2 (*)    All other  components within normal limits  COMPREHENSIVE METABOLIC PANEL - Abnormal; Notable for the following components:   CO2 33 (*)    Glucose, Bld 147 (*)    BUN 27 (*)    Creatinine, Ser 1.46 (*)    Calcium 11.7 (*)    Total Protein 8.5 (*)    GFR, Estimated 51 (*)    All other components within normal limits  CBC WITH DIFFERENTIAL/PLATELET - Abnormal; Notable for the following components:   WBC 16.0 (*)    RDW 16.0 (*)    Neutro Abs 13.4 (*)    Monocytes Absolute 1.1 (*)    Abs Immature Granulocytes 0.08 (*)    All other components within normal limits  APTT - Abnormal; Notable for the following components:   aPTT 38 (*)    All other components within normal limits  URINALYSIS, ROUTINE W REFLEX MICROSCOPIC - Abnormal; Notable for the following components:   APPearance HAZY (*)    All other components  within normal limits  I-STAT CHEM 8, ED - Abnormal; Notable for the following components:   Sodium 146 (*)    BUN 35 (*)    Creatinine, Ser 1.30 (*)    Glucose, Bld 141 (*)    TCO2 34 (*)    All other components within normal limits  RESP PANEL BY RT-PCR (FLU A&B, COVID) ARPGX2  CULTURE, BLOOD (ROUTINE X 2)  CULTURE, BLOOD (ROUTINE X 2)  URINE CULTURE  PROTIME-INR    EKG EKG Interpretation  Date/Time:  Thursday July 23 2021 19:32:02 EDT Ventricular Rate:  111 PR Interval:  181 QRS Duration: 95 QT Interval:  334 QTC Calculation: 454 R Axis:   98 Text Interpretation: Sinus tachycardia Right axis deviation No significant change since last tracing Confirmed by Richardean Canal [phone]) on 07/23/2021 7:37:22 PM  Radiology DG Chest Port 1 View  Result Date: 07/23/2021 CLINICAL DATA:  Altered mental status EXAM: PORTABLE CHEST 1 VIEW COMPARISON:  07/06/2021 FINDINGS: The heart size and mediastinal contours are within normal limits. Aortic atherosclerosis. Both lungs are clear. The visualized skeletal structures are unremarkable. IMPRESSION: No active disease. Electronically Signed   By: Jasmine Pang M.D.   On: 07/23/2021 20:46   CT Renal Stone Study  Result Date: 07/23/2021 CLINICAL DATA:  History of retroperitoneal bleed EXAM: CT ABDOMEN AND PELVIS WITHOUT CONTRAST TECHNIQUE: Multidetector CT imaging of the abdomen and pelvis was performed following the standard protocol without IV contrast. COMPARISON:  CT 06/21/1999 IV FINDINGS: Lower chest: Lung bases demonstrate no acute consolidation or effusion. Coronary vascular calcification. Hepatobiliary: No focal liver abnormality is seen. Status post cholecystectomy. No biliary dilatation. Pancreas: Unremarkable. No pancreatic ductal dilatation or surrounding inflammatory changes. Spleen: Normal in size without focal abnormality. Adrenals/Urinary Tract: Adrenal glands are normal. 9 cm right renal cyst. Small nonobstructing stone in the right  kidney. No hydronephrosis. 10 mm stone in the bladder. Bladder slightly thick walled Stomach/Bowel: Stomach contains percutaneous gastrostomy tube. No dilated small bowel. No acute bowel wall thickening. Negative appendix. Diverticular disease of the left colon. Vascular/Lymphatic: Mild aortic atherosclerosis. No aneurysm. No suspicious nodes Reproductive: Prostate is unremarkable. Other: Negative for free air or free fluid. Fat containing left inguinal hernia Musculoskeletal: Degenerative changes of the spine. Residual asymmetric enlargement of the right greater than left psoas consistent with resolving retroperitoneal hematoma. This has decreased compared to the prior exam. IMPRESSION: 1. No significant hydronephrosis.  10 mm stone in the bladder. 2. Residual asymmetric enlargement of right greater than left psoas  muscle though decreased compared to prior CT from July, consistent with resolving retroperitoneal hematoma. 3. 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 4. Nonobstructing right kidney stone Electronically Signed   By: Jasmine Pang M.D.   On: 07/23/2021 22:01    Procedures Procedures   CRITICAL CARE Performed by: Richardean Canal   Total critical care time: 30 minutes  Critical care time was exclusive of separately billable procedures and treating other patients.  Critical care was necessary to treat or prevent imminent or life-threatening deterioration.  Critical care was time spent personally by me on the following activities: development of treatment plan with patient and/or surrogate as well as nursing, discussions with consultants, evaluation of patient's response to treatment, examination of patient, obtaining history from patient or surrogate, ordering and performing treatments and interventions, ordering and review of laboratory studies, ordering and review of radiographic studies, pulse oximetry and re-evaluation of patient's condition.   Medications  Ordered in ED Medications  lactated ringers infusion ( Intravenous New Bag/Given 07/23/21 2245)  ceFEPIme (MAXIPIME) 2 g in sodium chloride 0.9 % 100 mL IVPB (has no administration in time range)  vancomycin (VANCOREADY) IVPB 1250 mg/250 mL (has no administration in time range)  lactated ringers bolus 1,000 mL (0 mLs Intravenous Stopped 07/23/21 2102)    And  lactated ringers bolus 1,000 mL (0 mLs Intravenous Stopped 07/23/21 2121)    And  lactated ringers bolus 1,000 mL (0 mLs Intravenous Stopped 07/23/21 2119)  ceFEPIme (MAXIPIME) 2 g in sodium chloride 0.9 % 100 mL IVPB (0 g Intravenous Stopped 07/23/21 2032)  metroNIDAZOLE (FLAGYL) IVPB 500 mg (0 mg Intravenous Stopped 07/23/21 2102)  vancomycin (VANCOREADY) IVPB 2000 mg/400 mL (2,000 mg Intravenous New Bag/Given 07/23/21 2110)  acetaminophen (TYLENOL) 160 MG/5ML solution 650 mg (650 mg Per Tube Given 07/23/21 2101)    ED Course  I have reviewed the triage vital signs and the nursing notes.  Pertinent labs & imaging results that were available during my care of the patient were reviewed by me and considered in my medical decision making (see chart for details).    MDM Rules/Calculators/A&P                          Benjamin Barnett is a 75 y.o. male who presented with altered mental status.  Patient is febrile to 102 on arrival.  Had complicated admission [facility].  He had PEA arrest at that time.  Patient is now DNR.  We will do sepsis work-up with cbc, cmp, lactate, cultures, UA.  Also get CT head to rule out a bleed.    11:15 PM Patient white count is 16.  Creatinine slightly elevated is 1.5.  CT showed no hydro.  Patient does have a large stone in the bladder.  However urinalysis did not show UTI. patient also has previous resolving retroperitoneal hematoma.  Given broad-spectrum antibiotics for sepsis of unclear etiology.  Patient will be admitted to the hospitalist service.   Final Clinical Impression(s) / ED  Diagnoses Final diagnoses:  None    Rx / DC Orders ED Discharge Orders     None        Charlynne Pander, MD 07/23/21 2315

## 2021-07-23 NOTE — ED Triage Notes (Signed)
Pt here from [facility] health care center for AMS. Pt discharged from [facility] today. Was not a [facility] center for more than an hour before coming here. Center called daughter to see what he usual baseline is and she confirmed that he is not at his baseline. Pt non verbal.

## 2021-07-23 NOTE — Progress Notes (Signed)
Pharmacy Antibiotic Note  Benjamin Barnett is a 75 y.o. male admitted on 07/23/2021 with sepsis. Patient with history of recent PEA arrest, hypertension, recent retroperitoneal hematoma, MRSA pneumonia. Presents with AMS per daughter.  Pharmacy has been consulted for cefepime and vancomycin dosing.  Plan: Cefepime 2g q12h Vancomycin 2000mg  once then 1250mg  q24hr unless change in renal function - eAUC 534.5 Monitor renal function F/u cultures De-escalate antibiotics as appropriate     Temp (24hrs), Avg:102.2 F (39 C), Min:102.2 F (39 C), Max:102.2 F (39 C)  Recent Labs  Lab 07/18/21 0612 07/20/21 0349 07/22/21 1156 07/23/21 0517 07/23/21 1933 07/23/21 2026  WBC  --   --  6.0  --  16.0*  --   CREATININE 1.27* 1.29* 1.21 1.34*  --  1.30*    CrCl cannot be calculated (Unknown ideal weight.).    No Known Allergies  Antimicrobials this admission: Cefepime 8/11 >>  Vancomycin 8/11 >>   Microbiology results: Pending  Thank you for allowing pharmacy to be a part of this patient's care.  10/11 07/23/2021 8:34 PM

## 2021-07-23 NOTE — ED Notes (Signed)
Pt BP high. Per Dr. Silverio Lay stopped third bolus.

## 2021-07-23 NOTE — Sepsis Progress Note (Signed)
Following per sepsis protocol   

## 2021-07-24 ENCOUNTER — Inpatient Hospital Stay (HOSPITAL_COMMUNITY): Payer: Medicare Other

## 2021-07-24 ENCOUNTER — Encounter (HOSPITAL_COMMUNITY): Payer: Self-pay | Admitting: Internal Medicine

## 2021-07-24 DIAGNOSIS — E782 Mixed hyperlipidemia: Secondary | ICD-10-CM

## 2021-07-24 DIAGNOSIS — J9601 Acute respiratory failure with hypoxia: Secondary | ICD-10-CM | POA: Diagnosis not present

## 2021-07-24 DIAGNOSIS — G9340 Encephalopathy, unspecified: Secondary | ICD-10-CM

## 2021-07-24 DIAGNOSIS — Z9911 Dependence on respirator [ventilator] status: Secondary | ICD-10-CM

## 2021-07-24 DIAGNOSIS — A419 Sepsis, unspecified organism: Secondary | ICD-10-CM | POA: Diagnosis not present

## 2021-07-24 DIAGNOSIS — E876 Hypokalemia: Secondary | ICD-10-CM

## 2021-07-24 DIAGNOSIS — E872 Acidosis, unspecified: Secondary | ICD-10-CM | POA: Diagnosis present

## 2021-07-24 DIAGNOSIS — M109 Gout, unspecified: Secondary | ICD-10-CM

## 2021-07-24 DIAGNOSIS — Z8614 Personal history of Methicillin resistant Staphylococcus aureus infection: Secondary | ICD-10-CM

## 2021-07-24 DIAGNOSIS — R6520 Severe sepsis without septic shock: Secondary | ICD-10-CM | POA: Diagnosis not present

## 2021-07-24 DIAGNOSIS — K219 Gastro-esophageal reflux disease without esophagitis: Secondary | ICD-10-CM

## 2021-07-24 DIAGNOSIS — G9341 Metabolic encephalopathy: Secondary | ICD-10-CM

## 2021-07-24 DIAGNOSIS — I469 Cardiac arrest, cause unspecified: Secondary | ICD-10-CM

## 2021-07-24 DIAGNOSIS — E46 Unspecified protein-calorie malnutrition: Secondary | ICD-10-CM | POA: Diagnosis present

## 2021-07-24 DIAGNOSIS — K1121 Acute sialoadenitis: Secondary | ICD-10-CM | POA: Diagnosis present

## 2021-07-24 DIAGNOSIS — I10 Essential (primary) hypertension: Secondary | ICD-10-CM

## 2021-07-24 HISTORY — DX: Essential (primary) hypertension: I10

## 2021-07-24 HISTORY — DX: Cardiac arrest, cause unspecified: I46.9

## 2021-07-24 HISTORY — DX: Mixed hyperlipidemia: E78.2

## 2021-07-24 HISTORY — DX: Gout, unspecified: M10.9

## 2021-07-24 HISTORY — DX: Personal history of Methicillin resistant Staphylococcus aureus infection: Z86.14

## 2021-07-24 HISTORY — DX: Gastro-esophageal reflux disease without esophagitis: K21.9

## 2021-07-24 LAB — CBC WITH DIFFERENTIAL/PLATELET
Abs Immature Granulocytes: 0.09 10*3/uL — ABNORMAL HIGH (ref 0.00–0.07)
Basophils Absolute: 0.1 10*3/uL (ref 0.0–0.1)
Basophils Relative: 1 %
Eosinophils Absolute: 0.1 10*3/uL (ref 0.0–0.5)
Eosinophils Relative: 0 %
HCT: 40.6 % (ref 39.0–52.0)
Hemoglobin: 12.4 g/dL — ABNORMAL LOW (ref 13.0–17.0)
Immature Granulocytes: 1 %
Lymphocytes Relative: 7 %
Lymphs Abs: 1.2 10*3/uL (ref 0.7–4.0)
MCH: 28.9 pg (ref 26.0–34.0)
MCHC: 30.5 g/dL (ref 30.0–36.0)
MCV: 94.6 fL (ref 80.0–100.0)
Monocytes Absolute: 1.4 10*3/uL — ABNORMAL HIGH (ref 0.1–1.0)
Monocytes Relative: 8 %
Neutro Abs: 14.3 10*3/uL — ABNORMAL HIGH (ref 1.7–7.7)
Neutrophils Relative %: 83 %
Platelets: 214 10*3/uL (ref 150–400)
RBC: 4.29 MIL/uL (ref 4.22–5.81)
RDW: 16.3 % — ABNORMAL HIGH (ref 11.5–15.5)
WBC: 17.1 10*3/uL — ABNORMAL HIGH (ref 4.0–10.5)
nRBC: 0 % (ref 0.0–0.2)

## 2021-07-24 LAB — MRSA NEXT GEN BY PCR, NASAL: MRSA by PCR Next Gen: DETECTED — AB

## 2021-07-24 LAB — GLUCOSE, CAPILLARY
Glucose-Capillary: 114 mg/dL — ABNORMAL HIGH (ref 70–99)
Glucose-Capillary: 106 mg/dL — ABNORMAL HIGH (ref 70–99)

## 2021-07-24 LAB — POCT I-STAT 7, (LYTES, BLD GAS, ICA,H+H)
Acid-Base Excess: 4 mmol/L — ABNORMAL HIGH (ref 0.0–2.0)
Bicarbonate: 26.4 mmol/L (ref 20.0–28.0)
Calcium, Ion: 1.46 mmol/L — ABNORMAL HIGH (ref 1.15–1.40)
HCT: 29 % — ABNORMAL LOW (ref 39.0–52.0)
Hemoglobin: 9.9 g/dL — ABNORMAL LOW (ref 13.0–17.0)
O2 Saturation: 95 %
Patient temperature: 98.9
Potassium: 3.4 mmol/L — ABNORMAL LOW (ref 3.5–5.1)
Sodium: 144 mmol/L (ref 135–145)
TCO2: 27 mmol/L (ref 22–32)
pCO2 arterial: 32.6 mmHg (ref 32.0–48.0)
pH, Arterial: 7.518 — ABNORMAL HIGH (ref 7.350–7.450)
pO2, Arterial: 67 mmHg — ABNORMAL LOW (ref 83.0–108.0)

## 2021-07-24 LAB — COMPREHENSIVE METABOLIC PANEL
ALT: 17 U/L (ref 0–44)
AST: 21 U/L (ref 15–41)
Albumin: 2.9 g/dL — ABNORMAL LOW (ref 3.5–5.0)
Alkaline Phosphatase: 66 U/L (ref 38–126)
Anion gap: 12 (ref 5–15)
BUN: 24 mg/dL — ABNORMAL HIGH (ref 8–23)
CO2: 27 mmol/L (ref 22–32)
Calcium: 11 mg/dL — ABNORMAL HIGH (ref 8.9–10.3)
Chloride: 103 mmol/L (ref 98–111)
Creatinine, Ser: 1.33 mg/dL — ABNORMAL HIGH (ref 0.61–1.24)
GFR, Estimated: 57 mL/min — ABNORMAL LOW (ref >60)
Glucose, Bld: 123 mg/dL — ABNORMAL HIGH (ref 70–99)
Potassium: 3.3 mmol/L — ABNORMAL LOW (ref 3.5–5.1)
Sodium: 142 mmol/L (ref 135–145)
Total Bilirubin: 1.1 mg/dL (ref 0.3–1.2)
Total Protein: 7.4 g/dL (ref 6.5–8.1)

## 2021-07-24 LAB — CORTISOL-AM, BLOOD: Cortisol - AM: 30 ug/dL — ABNORMAL HIGH (ref 6.7–22.6)

## 2021-07-24 LAB — MAGNESIUM: Magnesium: 1.7 mg/dL (ref 1.7–2.4)

## 2021-07-24 LAB — PROTIME-INR
INR: 1.2 (ref 0.8–1.2)
Prothrombin Time: 15.4 seconds — ABNORMAL HIGH (ref 11.4–15.2)

## 2021-07-24 LAB — PROCALCITONIN: Procalcitonin: 0.48 ng/mL

## 2021-07-24 MED ORDER — POTASSIUM CHLORIDE 20 MEQ PO PACK
40.0000 meq | PACK | Freq: Once | ORAL | Status: AC
  Administered 2021-07-24: 40 meq
  Filled 2021-07-24: qty 2

## 2021-07-24 MED ORDER — ACETAMINOPHEN 325 MG PO TABS
650.0000 mg | ORAL_TABLET | Freq: Four times a day (QID) | ORAL | Status: DC | PRN
  Administered 2021-07-24 – 2021-07-30 (×4): 650 mg
  Filled 2021-07-24 (×4): qty 2

## 2021-07-24 MED ORDER — ZINC OXIDE 12.8 % EX OINT
TOPICAL_OINTMENT | Freq: Two times a day (BID) | CUTANEOUS | Status: DC
  Administered 2021-08-01: 1 via TOPICAL
  Filled 2021-07-24 (×2): qty 56.7

## 2021-07-24 MED ORDER — PROSOURCE TF PO LIQD
45.0000 mL | Freq: Two times a day (BID) | ORAL | Status: DC
  Administered 2021-07-24 – 2021-07-27 (×7): 45 mL
  Filled 2021-07-24 (×8): qty 45

## 2021-07-24 MED ORDER — FREE WATER
45.0000 mL | Status: DC
  Administered 2021-07-24 – 2021-07-28 (×51): 45 mL

## 2021-07-24 MED ORDER — ONDANSETRON HCL 4 MG PO TABS: 4.0000 mg | ORAL_TABLET | Freq: Four times a day (QID) | ORAL | Status: DC | PRN

## 2021-07-24 MED ORDER — CHLORHEXIDINE GLUCONATE 0.12% ORAL RINSE (MEDLINE KIT)
15.0000 mL | Freq: Two times a day (BID) | OROMUCOSAL | Status: DC
  Administered 2021-07-24 – 2021-07-28 (×8): 15 mL via OROMUCOSAL

## 2021-07-24 MED ORDER — SODIUM CHLORIDE 0.9 % IV SOLN
3.0000 g | Freq: Three times a day (TID) | INTRAVENOUS | Status: AC
  Administered 2021-07-24 – 2021-07-29 (×18): 3 g via INTRAVENOUS
  Filled 2021-07-24 (×19): qty 8

## 2021-07-24 MED ORDER — MIDAZOLAM HCL 2 MG/2ML IJ SOLN: 4.0000 mg | Freq: Once | INTRAMUSCULAR | Status: DC

## 2021-07-24 MED ORDER — POLYVINYL ALCOHOL 1.4 % OP SOLN: 1.0000 [drp] | Freq: Three times a day (TID) | OPHTHALMIC | Status: DC | PRN

## 2021-07-24 MED ORDER — PROPOFOL 1000 MG/100ML IV EMUL
0.0000 ug/kg/min | INTRAVENOUS | Status: DC
  Administered 2021-07-24: 5 ug/kg/min via INTRAVENOUS
  Administered 2021-07-25 (×3): 20 ug/kg/min via INTRAVENOUS
  Administered 2021-07-26: 30 ug/kg/min via INTRAVENOUS
  Administered 2021-07-26: 20 ug/kg/min via INTRAVENOUS
  Filled 2021-07-24: qty 100
  Filled 2021-07-24: qty 200
  Filled 2021-07-24 (×3): qty 100

## 2021-07-24 MED ORDER — METOCLOPRAMIDE HCL 5 MG PO TABS
10.0000 mg | ORAL_TABLET | Freq: Three times a day (TID) | ORAL | Status: DC
  Administered 2021-07-24 – 2021-08-02 (×38): 10 mg
  Filled 2021-07-24 (×3): qty 2
  Filled 2021-07-24 (×4): qty 1
  Filled 2021-07-24: qty 2
  Filled 2021-07-24: qty 1
  Filled 2021-07-24 (×2): qty 2
  Filled 2021-07-24 (×7): qty 1
  Filled 2021-07-24: qty 2
  Filled 2021-07-24: qty 1
  Filled 2021-07-24 (×2): qty 2
  Filled 2021-07-24: qty 1
  Filled 2021-07-24 (×5): qty 2
  Filled 2021-07-24 (×3): qty 1
  Filled 2021-07-24 (×2): qty 2
  Filled 2021-07-24 (×2): qty 1
  Filled 2021-07-24: qty 2
  Filled 2021-07-24: qty 1
  Filled 2021-07-24 (×2): qty 2

## 2021-07-24 MED ORDER — METOPROLOL TARTRATE 25 MG PO TABS
25.0000 mg | ORAL_TABLET | Freq: Two times a day (BID) | ORAL | Status: DC
  Administered 2021-07-24 – 2021-07-27 (×6): 25 mg
  Filled 2021-07-24 (×8): qty 1

## 2021-07-24 MED ORDER — ROCURONIUM BROMIDE 10 MG/ML (PF) SYRINGE
PREFILLED_SYRINGE | INTRAVENOUS | Status: AC
  Filled 2021-07-24: qty 10

## 2021-07-24 MED ORDER — INSULIN ASPART 100 UNIT/ML IJ SOLN
0.0000 [IU] | INTRAMUSCULAR | Status: DC
Start: 2021-07-24 — End: 2021-08-03
  Administered 2021-07-25: 1 [IU] via SUBCUTANEOUS
  Administered 2021-07-25: 2 [IU] via SUBCUTANEOUS
  Administered 2021-07-25 (×2): 1 [IU] via SUBCUTANEOUS
  Administered 2021-07-25 (×2): 2 [IU] via SUBCUTANEOUS
  Administered 2021-07-26: 1 [IU] via SUBCUTANEOUS
  Administered 2021-07-26: 2 [IU] via SUBCUTANEOUS
  Administered 2021-07-26: 1 [IU] via SUBCUTANEOUS
  Administered 2021-07-26: 2 [IU] via SUBCUTANEOUS
  Administered 2021-07-26 – 2021-08-01 (×8): 1 [IU] via SUBCUTANEOUS
  Administered 2021-08-02: 2 [IU] via SUBCUTANEOUS
  Administered 2021-08-02 (×2): 1 [IU] via SUBCUTANEOUS

## 2021-07-24 MED ORDER — KETAMINE HCL 50 MG/5ML IJ SOSY
PREFILLED_SYRINGE | INTRAMUSCULAR | Status: AC
  Filled 2021-07-24: qty 5

## 2021-07-24 MED ORDER — FENTANYL CITRATE (PF) 100 MCG/2ML IJ SOLN
INTRAMUSCULAR | Status: AC
  Administered 2021-07-24: 100 ug via INTRAVENOUS
  Filled 2021-07-24: qty 2

## 2021-07-24 MED ORDER — ROCURONIUM BROMIDE 50 MG/5ML IV SOLN
100.0000 mg | Freq: Once | INTRAVENOUS | Status: DC
  Filled 2021-07-24: qty 10

## 2021-07-24 MED ORDER — POLYETHYLENE GLYCOL 3350 17 G PO PACK
17.0000 g | PACK | Freq: Every day | ORAL | Status: DC
  Administered 2021-07-24 – 2021-07-29 (×6): 17 g
  Filled 2021-07-24 (×6): qty 1

## 2021-07-24 MED ORDER — ETOMIDATE 2 MG/ML IV SOLN: 10.0000 mg | Freq: Once | INTRAVENOUS | Status: AC

## 2021-07-24 MED ORDER — MORPHINE SULFATE (PF) 2 MG/ML IV SOLN
2.0000 mg | INTRAVENOUS | Status: DC | PRN
  Administered 2021-07-24: 2 mg via INTRAVENOUS
  Filled 2021-07-24: qty 1

## 2021-07-24 MED ORDER — DEXAMETHASONE SODIUM PHOSPHATE 10 MG/ML IJ SOLN
6.0000 mg | INTRAMUSCULAR | Status: DC
Start: 2021-07-25 — End: 2021-07-27
  Administered 2021-07-25 – 2021-07-26 (×2): 6 mg via INTRAVENOUS
  Filled 2021-07-24 (×3): qty 0.6

## 2021-07-24 MED ORDER — LACTATED RINGERS IV SOLN: INTRAVENOUS | Status: AC

## 2021-07-24 MED ORDER — ETOMIDATE 2 MG/ML IV SOLN
INTRAVENOUS | Status: AC
  Administered 2021-07-24: 20 mg
  Filled 2021-07-24: qty 20

## 2021-07-24 MED ORDER — ORAL CARE MOUTH RINSE
15.0000 mL | OROMUCOSAL | Status: DC
  Administered 2021-07-24 – 2021-07-28 (×40): 15 mL via OROMUCOSAL

## 2021-07-24 MED ORDER — AMLODIPINE BESYLATE 5 MG PO TABS
5.0000 mg | ORAL_TABLET | Freq: Every day | ORAL | Status: DC
  Administered 2021-07-24 – 2021-07-29 (×6): 5 mg
  Filled 2021-07-24 (×6): qty 1

## 2021-07-24 MED ORDER — CHLORHEXIDINE GLUCONATE CLOTH 2 % EX PADS
6.0000 | MEDICATED_PAD | Freq: Every day | CUTANEOUS | Status: DC
  Administered 2021-07-24 – 2021-07-28 (×4): 6 via TOPICAL

## 2021-07-24 MED ORDER — FENTANYL 2500MCG IN NS 250ML (10MCG/ML) PREMIX INFUSION
25.0000 ug/h | INTRAVENOUS | Status: DC
  Administered 2021-07-24: 25 ug/h via INTRAVENOUS
  Administered 2021-07-26: 100 ug/h via INTRAVENOUS
  Administered 2021-07-26: 25 ug/h via INTRAVENOUS
  Administered 2021-07-27: 125 ug/h via INTRAVENOUS
  Filled 2021-07-24 (×4): qty 250

## 2021-07-24 MED ORDER — NEPRO/CARBSTEADY PO LIQD
1000.0000 mL | ORAL | Status: DC
  Administered 2021-07-24 – 2021-07-25 (×2): 1000 mL
  Filled 2021-07-24 (×5): qty 1000

## 2021-07-24 MED ORDER — DOCUSATE SODIUM 50 MG/5ML PO LIQD
100.0000 mg | Freq: Two times a day (BID) | ORAL | Status: DC
  Administered 2021-07-24 – 2021-07-28 (×8): 100 mg
  Filled 2021-07-24 (×8): qty 10

## 2021-07-24 MED ORDER — SODIUM CHLORIDE 0.9 % IV SOLN
INTRAVENOUS | Status: DC | PRN
  Administered 2021-07-24: 100 mL via INTRAVENOUS

## 2021-07-24 MED ORDER — CIPROFLOXACIN HCL 0.3 % OP SOLN
1.0000 [drp] | Freq: Three times a day (TID) | OPHTHALMIC | Status: DC
  Administered 2021-07-24 – 2021-08-02 (×29): 1 [drp] via OPHTHALMIC
  Filled 2021-07-24: qty 2.5

## 2021-07-24 MED ORDER — ONDANSETRON HCL 4 MG/2ML IJ SOLN: 4.0000 mg | Freq: Four times a day (QID) | INTRAMUSCULAR | Status: DC | PRN

## 2021-07-24 MED ORDER — FENTANYL CITRATE (PF) 100 MCG/2ML IJ SOLN: 100.0000 ug | Freq: Once | INTRAMUSCULAR | Status: AC

## 2021-07-24 MED ORDER — ACETAMINOPHEN 650 MG RE SUPP: 650.0000 mg | Freq: Four times a day (QID) | RECTAL | Status: DC | PRN

## 2021-07-24 MED ORDER — DEXAMETHASONE SODIUM PHOSPHATE 10 MG/ML IJ SOLN
10.0000 mg | Freq: Once | INTRAMUSCULAR | Status: AC
  Administered 2021-07-24: 10 mg via INTRAVENOUS
  Filled 2021-07-24: qty 1

## 2021-07-24 MED ORDER — HYDRALAZINE HCL 20 MG/ML IJ SOLN
10.0000 mg | Freq: Four times a day (QID) | INTRAMUSCULAR | Status: DC | PRN
  Administered 2021-07-24 – 2021-07-28 (×3): 10 mg via INTRAVENOUS
  Filled 2021-07-24 (×3): qty 1

## 2021-07-24 MED ORDER — FENTANYL CITRATE (PF) 100 MCG/2ML IJ SOLN: 25.0000 ug | Freq: Once | INTRAMUSCULAR | Status: DC

## 2021-07-24 MED ORDER — MIDAZOLAM HCL 2 MG/2ML IJ SOLN
INTRAMUSCULAR | Status: AC
  Filled 2021-07-24: qty 2

## 2021-07-24 MED ORDER — AMANTADINE HCL 100 MG PO CAPS
100.0000 mg | ORAL_CAPSULE | Freq: Two times a day (BID) | ORAL | Status: DC
  Administered 2021-07-24 – 2021-07-25 (×3): 100 mg
  Filled 2021-07-24 (×3): qty 1

## 2021-07-24 MED ORDER — FENTANYL BOLUS VIA INFUSION
25.0000 ug | INTRAVENOUS | Status: DC | PRN
  Administered 2021-07-26: 25 ug via INTRAVENOUS
  Administered 2021-07-26: 100 ug via INTRAVENOUS
  Administered 2021-07-26: 50 ug via INTRAVENOUS
  Administered 2021-07-26 (×4): 100 ug via INTRAVENOUS
  Administered 2021-07-26: 25 ug via INTRAVENOUS
  Filled 2021-07-24: qty 100

## 2021-07-24 MED ORDER — SUCCINYLCHOLINE CHLORIDE 20 MG/ML IJ SOLN: 0.5000 mg/kg | Freq: Once | INTRAMUSCULAR | Status: AC

## 2021-07-24 MED ORDER — PANTOPRAZOLE SODIUM 40 MG PO PACK
40.0000 mg | PACK | Freq: Every day | ORAL | Status: DC
  Administered 2021-07-24 – 2021-08-02 (×9): 40 mg
  Filled 2021-07-24 (×11): qty 20

## 2021-07-24 MED ORDER — ENOXAPARIN SODIUM 40 MG/0.4ML IJ SOSY
40.0000 mg | PREFILLED_SYRINGE | Freq: Every day | INTRAMUSCULAR | Status: DC
  Administered 2021-07-24 – 2021-08-02 (×10): 40 mg via SUBCUTANEOUS
  Filled 2021-07-24 (×10): qty 0.4

## 2021-07-24 MED ORDER — SUCCINYLCHOLINE CHLORIDE 200 MG/10ML IV SOSY
PREFILLED_SYRINGE | INTRAVENOUS | Status: AC
  Administered 2021-07-24: 48.4 mg via INTRAVENOUS
  Filled 2021-07-24: qty 10

## 2021-07-24 MED ORDER — IOHEXOL 350 MG/ML SOLN
80.0000 mL | Freq: Once | INTRAVENOUS | Status: AC | PRN
  Administered 2021-07-24: 80 mL via INTRAVENOUS

## 2021-07-24 NOTE — ED Notes (Signed)
Oral care provided by this RN using mouth swab with oral rinse applied

## 2021-07-24 NOTE — Procedures (Signed)
Intubation Procedure Note  Tydus Sanmiguel Skog  [id number]  1946/06/08  Date:07/24/21  Time:6:25 PM   Provider Performing:Lasharn Bufkin Audrie Lia    Procedure: Intubation (31500)  Indication(s) Respiratory Failure  Consent Risks of the procedure as well as the alternatives and risks of each were explained to the patient and/or caregiver.  Consent for the procedure was obtained and is signed in the bedside chart   Anesthesia Etomidate and Succinylcholine   Time Out Verified patient identification, verified procedure, site/side was marked, verified correct patient position, special equipment/implants available, medications/allergies/relevant history reviewed, required imaging and test results available.   Sterile Technique Usual hand hygeine, masks, and gloves were used   Procedure Description Patient positioned in bed supine.  Sedation given as noted above.  Patient was intubated with endotracheal tube using Glidescope.  Significant supraglottic tissue edema but cords were normal. View was Grade 1 full glottis .  Number of attempts was 1.  Colorimetric CO2 detector was consistent with tracheal placement.   Complications/Tolerance None; patient tolerated the procedure well. Chest X-ray is ordered to verify placement.   EBL Minimal   Specimen(s) None  Steffanie Dunn, DO 07/24/21 6:29 PM [facility]

## 2021-07-24 NOTE — Progress Notes (Signed)
PROGRESS NOTE    Benjamin Barnett  SWF:[id number] DOB: 1946/02/28 DOA: 07/23/2021 PCP: Eloisa Northern, MD    Brief Narrative:  Mr. Benjamin Barnett was admitted to the hospital working diagnosis of sepsis due to right-sided suppurative parotiditis.  75 year old male past medical history for hypertension, dyslipidemia, gout and reflux who presented with lethargy.  Recent prolonged hospitalization for PEA arrest 05/2021, transferred to [facility].  He was transferred back to the hospital due to lethargy.  He was unable to give any detailed history due to severe cognitive impairment.  Apparently patient was transferred from [facility] to [facility] health care on 8/11, he was noted to be lethargic and poorly responsive, EMS was called and patient was brought to the hospital.  His blood pressure was 206/102, heart rate 118, respirate 17, oxygen saturation 97%, with scattered rhonchi bilaterally, heart S1-S2, present, tachycardic, abdomen soft nontender, no lower extremity edema.  He was extremely lethargic and minimally arousable.  Not responsive to verbal stimuli.  Not able to follow commands or answer questions.  Sodium 144, potassium 3.7, chloride 103, bicarb 33, glucose 147, BUN 27, creatinine 1.46, white count 16.0, hemoglobin 13.9, hematocrit 44.8, platelets 269 SARS COVID-19 negative.  Urinalysis specific gravity 1.013.  Head CT negative for acute changes.  Positive atrophy, ventricular enlargement. Renal CT with no significant hydronephrosis.  10 mm stone in the bladder.  Residual symmetric enlargement of right greater than left psoas muscle decreased from prior CT from July, consistent with resolving retroperitoneal hematoma.  Soft tissue CT neck, multi spatial inflammation and edema in the right face and neck, moderately involving the right lateral parapharyngeal wall and the supraglottic larynx.  No airway compromise.  Multifocal right-sided swallow adenitis, bulky inflammatory right parotid gland. Inflamed right  submandibular gland. Bulky calcified carotid plaque with hemodynamically significant proximal left ICA stenosis.  Chest radiograph no infiltrates.  EKG 111 bpm, normal axis, normal intervals, sinus rhythm, no significant ST segment or T wave changes.  Assessment & Plan:   Principal Problem:   Sepsis (HCC) Active Problems:   Acute suppurative parotitis   Acute metabolic encephalopathy   Protein calorie malnutrition (HCC)   GERD without esophagitis   Lactic acidosis   Hypokalemia   Essential hypertension   Severe sepsis due to right parotiditis. Patient transferred to progressive care unit  He is poorly responsive, only briefly opening his eyes to pain stimuli. Positive congestion and rhonchi in the upper airway.   Will place patient on non re breather mask and get ABG.  I spoke with his daughter who confirms patient is full code.  I am concerned about patient losing his airway, consulted critical care for further evaluation. He may need an advance airway.  Continue broad spectrum antibiotic therapy with Unasyn and vancomycin.  IV fluids with LR at 125 ml per hr.  Consulted ENT Dr Pollyann Kennedy for urgent consultation.    2.  Metabolic encephalopathy likely related to sepsis.  Hold on tube feedings and continue neuro checks.   3. HTN. Discontinue amlodipine due to risk of hypotension.    Patient continue to be at high risk for worsening sepsis and respiratory failure Critical care time 60 minutes   Status is: Inpatient  Remains inpatient appropriate because:Inpatient level of care appropriate due to severity of illness  Dispo: The patient is from: SNF              Anticipated d/c is to: SNF              Patient  currently is not medically stable to d/c.   Difficult to place patient No   DVT prophylaxis: Enoxaparin   Code Status:   full  Family Communication:  I spoke over the phone with the patient's daughter about patient's  condition, plan of care, prognosis and all  questions were addressed.       Consultants:   Critical care    Antimicrobials:  Unasyn and vancomycin     Subjective: Patient not responsive, positive increase work of breathing.   Objective: Vitals:   07/24/21 1430 07/24/21 1445 07/24/21 1500 07/24/21 1530  BP: (!) 171/95 (!) 190/90 (!) 173/98 (!) 178/96  Pulse: 96 (!) 102 98 100  Resp: (!) 27 (!) 23 (!) 23 (!) 23  Temp:      TempSrc:      SpO2: 96% 98% 97% 95%  Weight:      Height:        Intake/Output Summary (Last 24 hours) at 07/24/2021 1536 Last data filed at 07/24/2021 1124 Gross per 24 hour  Intake 2100 ml  Output 850 ml  Net 1250 ml   Filed Weights   07/23/21 2059  Weight: 96.6 kg    Examination:   General:  Neurology: unresponsive E ENT: positive pallor, no icterus, oral mucosa dry  Cardiovascular: No JVD. S1-S2 present, rhythmic, no gallops, rubs, or murmurs. No lower extremity edema. Pulmonary: positive breath sounds bilaterally, with positive rhonchi.  Positive rhonchi at the neck but no frank stridor,  Gastrointestinal. Abdomen soft, peg tube in place. Skin. No rashes Musculoskeletal: no joint deformities     Data Reviewed: I have personally reviewed following labs and imaging studies  CBC: Recent Labs  Lab 07/22/21 1156 07/23/21 1933 07/23/21 2026 07/24/21 0500  WBC 6.0 16.0*  --  17.1*  NEUTROABS  --  13.4*  --  14.3*  HGB 12.1* 13.9 13.3 12.4*  HCT 38.8* 44.8 39.0 40.6  MCV 91.9 93.9  --  94.6  PLT 199 269  --  214   Basic Metabolic Panel: Recent Labs  Lab 07/20/21 0349 07/22/21 1156 07/23/21 0517 07/23/21 1933 07/23/21 2026 07/24/21 0500  NA 138 143 145 144 146* 142  K 3.5 3.1* 3.2* 3.7 4.0 3.3*  CL 102 105 104 103 106 103  CO2 25 33* 34* 33*  --  27  GLUCOSE 87 120* 121* 147* 141* 123*  BUN 33* 24* 26* 27* 35* 24*  CREATININE 1.29* 1.21 1.34* 1.46* 1.30* 1.33*  CALCIUM 10.9* 11.4* 11.3* 11.7*  --  11.0*  MG  --   --   --   --   --  1.7   GFR: Estimated  Creatinine Clearance: 58.5 mL/min (A) (by C-G formula based on SCr of 1.33 mg/dL (H)). Liver Function Tests: Recent Labs  Lab 07/23/21 1933 07/24/21 0500  AST 26 21  ALT 17 17  ALKPHOS 70 66  BILITOT 1.0 1.1  PROT 8.5* 7.4  ALBUMIN 3.5 2.9*   No results for input(s): LIPASE, AMYLASE in the last 168 hours. No results for input(s): AMMONIA in the last 168 hours. Coagulation Profile: Recent Labs  Lab 07/19/21 0318 07/20/21 0349 07/21/21 0342 07/23/21 1933 07/24/21 0500  INR 1.0 1.0 1.1 1.2 1.2   Cardiac Enzymes: No results for input(s): CKTOTAL, CKMB, CKMBINDEX, TROPONINI in the last 168 hours. BNP (last 3 results) No results for input(s): PROBNP in the last 8760 hours. HbA1C: No results for input(s): HGBA1C in the last 72 hours. CBG: No results for input(s): GLUCAP  in the last 168 hours. Lipid Profile: No results for input(s): CHOL, HDL, LDLCALC, TRIG, CHOLHDL, LDLDIRECT in the last 72 hours. Thyroid Function Tests: No results for input(s): TSH, T4TOTAL, FREET4, T3FREE, THYROIDAB in the last 72 hours. Anemia Panel: No results for input(s): VITAMINB12, FOLATE, FERRITIN, TIBC, IRON, RETICCTPCT in the last 72 hours.    Radiology Studies: I have reviewed all of the imaging during this hospital visit personally     Scheduled Meds:  amantadine  100 mg Per Tube BID   amLODipine  5 mg Per Tube Daily   ciprofloxacin  1 drop Both Eyes TID   enoxaparin (LOVENOX) injection  40 mg Subcutaneous Daily   feeding supplement (PROSource TF)  45 mL Per Tube BID   free water  45 mL Per Tube Q2H   metoCLOPramide  10 mg Per Tube TID AC & HS   metoprolol tartrate  25 mg Per Tube BID   pantoprazole sodium  40 mg Per Tube Daily   polyethylene glycol  17 g Per Tube Daily   Zinc Oxide   Topical BID   Continuous Infusions:  ampicillin-sulbactam (UNASYN) IV Stopped (07/24/21 1103)   feeding supplement (NEPRO CARB STEADY) 1,000 mL (07/24/21 0815)   lactated ringers 125 mL/hr at  07/24/21 0723   vancomycin       LOS: 1 day        Amilcar Reever Annett Gula, MD

## 2021-07-24 NOTE — ED Notes (Signed)
Attempted to call report. RN not available at this time. Extension given for call back. 

## 2021-07-24 NOTE — ED Notes (Addendum)
Provider made aware of patients elevated BP. This RN also found patient to have large, firm, mass on rt side of face. Provider made aware

## 2021-07-24 NOTE — Significant Event (Signed)
Rapid Response Event Note   Reason for Call :  Respiratory distress  Initial Focused Assessment:  Called by RN because patient had quite audible rhonchi. The patient is tachypnic and has audible wheezes as well. The patient is obtunded, only waking up to sternal rub and trap squeeze. The patient also has firm edema in the right parotid area. CCM at bedside and wants the patient transferred to ICU for intubation  BP 123/58 HR 107 RR 17 O2 98% NRB   Interventions:  Patient transferred to ICU for airway management   Event Summary:   MD Notified: Arrien and CCM Call Time: 1647 Arrival Time: 1649 End Time: 1800  Andrey Spearman, RN

## 2021-07-24 NOTE — H&P (Signed)
History and Physical    Benjamin Barnett VXB:[id number] DOB: [birth year] DOA: 07/23/2021  PCP: Eloisa Northern, MD  Patient coming from: [facility]    Chief Complaint: Lethargy    HPI:    75 year old male with past medical history of hypertension, hyperlipidemia, gout and gastroesophageal reflux disease with PEA arrest in 05/2021 resulting in recent prolonged hospitalization including prolonged stay at an LTAC who is 2 [facility] emergency department today after being transferred from [facility] LT[facility] to [facility] health care skilled nurse facility and found to be extremely lethargic and minimally responsive.  Of note, patient initially presented to [facility] in early June 2022 with altered mental status, shortly after patient was admitted he suffered PEA arrest resulting in intubation and admission to the intensive care unit on 6/5.  Patient was eventually extubated on 6/8.  Hospital course was complicated by MRSA pneumonia and bouts of agitation/delirium requiring intermittent usage of Precedex.  Patient eventually clinically improved and was discharged to specialty [facility] LT[facility] on 6/16.  While at [facility] patient developed multiple complications including acute left parotiditis.  Patient additionally developed Morganella UTI treated with a course of ciprofloxacin as well as developing a retroperitoneal hemorrhage.  Patient is minimally responsive and therefore majority the history has been obtained via discussion with the emergency department staff as well as discussions with the daughter via phone conversation.  After patient's prolonged hospital course at [facility] specialty hospital as detailed above, patient was transferred to [facility] health care on 8/11.  Upon arrival to [facility] health care patient was noted to be extremely lethargic and minimally arousable which is a dramatic change from his baseline level of functioning.  EMS was contacted who  promptly came to evaluate the patient and upon confirming that patient was minimally responsive patient was brought into [facility] emergency department for evaluation.  Upon evaluation in the emergency department patient was found to exhibit multiple SIRS criteria.  This can combination with encephalopathy and lactic acidosis of 2.6 were all concerning for sepsis.  Patient was initiated on broad-spectrum intravenous antibiotic therapy including vancomycin, cefepime and Flagyl.  Patient was administered 3 L of lactated Ringer's solution via bolus.  Blood cultures were obtained.  The hospitalist group was then called to assess the patient for admission to the hospital.  Review of Systems:   Review of Systems  Unable to perform ROS: Patient nonverbal   Past Medical History:  Diagnosis Date   Cardiac arrest (HCC) 07/24/2021   Essential hypertension 07/24/2021   GERD without esophagitis 07/24/2021   Gout 07/24/2021   History of MRSA infection of lungs 07/24/2021   Mixed hyperlipidemia 07/24/2021    Past Surgical History:  Procedure Laterality Date   IR GASTROSTOMY TUBE MOD SED  06/16/2021     reports that he has never smoked. He has never used smokeless tobacco. He reports that he does not drink alcohol and does not use drugs.  Allergies  Allergen Reactions   Codeine Rash   Furosemide Rash   Ibuprofen Rash   Meperidine Rash   Tape Other (See Comments)    Other reaction(s): Redness    Family History  Family history unknown: Yes     Prior to Admission medications   Medication Sig Start Date End Date Taking? Authorizing Provider  amantadine (SYMMETREL) 100 MG capsule Take 100 mg by mouth 2 (two) times daily.   Yes [provider]  Amino Acids-Protein Hydrolys (PRO-STAT) LIQD Take 30 mLs by mouth  2 (two) times daily.   Yes [provider]  amLODipine (NORVASC) 5 MG tablet Take 5 mg by mouth daily. 02/03/21  Yes [provider]  carboxymethylcellulose  (REFRESH PLUS) 0.5 % SOLN Apply 1 drop to eye 3 (three) times daily as needed (dry eyes).   Yes [provider]  Cetirizine HCl 10 MG CAPS Take 1 capsule by mouth daily as needed for allergies.   Yes [provider]  ciprofloxacin (CILOXAN) 0.3 % ophthalmic solution Place 1 drop into both eyes 3 (three) times daily.   Yes [provider]  metoCLOPramide (REGLAN) 10 MG tablet Take 10 mg by mouth 4 (four) times daily.   Yes [provider]  metoprolol tartrate (LOPRESSOR) 25 MG tablet Take 25 mg by mouth 2 (two) times daily. 05/14/21  Yes [provider]  mirtazapine (REMERON) 15 MG tablet Take 7.5 mg by mouth at bedtime.   Yes [provider]  modafinil (PROVIGIL) 100 MG tablet Take 100 mg by mouth daily.   Yes [provider]  Multiple Vitamin (MULTIVITAMIN) tablet Take 1 tablet by mouth daily.   Yes [provider]  pantoprazole sodium (PROTONIX) 40 mg/20 mL PACK Take 40 mg by mouth daily.   Yes [provider]  polyethylene glycol (MIRALAX / GLYCOLAX) 17 g packet Take 17 g by mouth daily.   Yes [provider]  QUEtiapine (SEROQUEL) 50 MG tablet Take 50 mg by mouth at bedtime.   Yes [provider]  sertraline (ZOLOFT) 50 MG tablet Take 50 mg by mouth daily.   Yes [provider]    Physical Exam: Vitals:   07/24/21 0045 07/24/21 0130 07/24/21 0145 07/24/21 0200  BP: (!) 206/102 (!) 179/104 (!) 162/109 (!) 185/105  Pulse: (!) 118 (!) 116 (!) 115 (!) 118  Resp: 17 (!) 21 (!) 25 20  Temp:      TempSrc:      SpO2: 99% 97% 97% 97%  Weight:      Height:        Constitutional: Lethargic, minimally responsive appearing to be in distress Skin: Severe hyperemia of the right face noted.  Notable hyperemia with excoriation of the skin noted of the gluteal folds gluteal cleft and folds of the groin.  Multiple skin tears noted to the bilateral elbows.  Poor skin turgor noted.   Eyes: Notable  right lateral deviation of the right eye.  Pupils are equally reactive to light.  No evidence of scleral icterus or conjunctival pallor.  ENMT: Extremely poor oral hygiene noted with extremely dry mucous membranes.  No obvious evidence of thrush.   Neck: normal, supple, no masses, no thyromegaly.  No evidence of jugular venous distension.   Respiratory: Scattered rhonchi bilaterally no evidence of wheezing.  Increased respiratory effort without evidence of accessory muscle use.  Cardiovascular: Tachycardic rate with regular rhythm no murmurs / rubs / gallops. No extremity edema. 2+ pedal pulses. No carotid bruits.  Chest:   Nontender without crepitus or deformity.   Back:   Nontender without crepitus or deformity. Abdomen: Abdomen is soft and nontender.  No evidence of intra-abdominal masses.  Positive bowel sounds noted in all quadrants.   Musculoskeletal: No joint deformity upper and lower extremities. Good ROM, no contractures. Normal muscle tone.  Neurologic: Patient is extremely lethargic and minimally arousable.  Patient is not responsive to verbal stimuli.  Patient is currently not following commands.  Patient does localize to pain.   Psychiatric: Unable to assess due  to severe lethargy.  Patient currently does not seem to possess insight as to his current situation.   Labs on Admission: I have personally reviewed following labs and imaging studies -   CBC: Recent Labs  Lab 07/22/21 1156 07/23/21 1933 07/23/21 2026  WBC 6.0 16.0*  --   NEUTROABS  --  13.4*  --   HGB 12.1* 13.9 13.3  HCT 38.8* 44.8 39.0  MCV 91.9 93.9  --   PLT 199 269  --    Basic Metabolic Panel: Recent Labs  Lab 07/18/21 0612 07/20/21 0349 07/22/21 1156 07/23/21 0517 07/23/21 1933 07/23/21 2026  NA 141 138 143 145 144 146*  K 3.4* 3.5 3.1* 3.2* 3.7 4.0  CL 106 102 105 104 103 106  CO2 25 25 33* 34* 33*  --   GLUCOSE 101* 87 120* 121* 147* 141*  BUN 32* 33* 24* 26* 27* 35*  CREATININE 1.27* 1.29*  1.21 1.34* 1.46* 1.30*  CALCIUM 10.8* 10.9* 11.4* 11.3* 11.7*  --    GFR: Estimated Creatinine Clearance: 59.9 mL/min (A) (by C-G formula based on SCr of 1.3 mg/dL (H)). Liver Function Tests: Recent Labs  Lab 07/23/21 1933  AST 26  ALT 17  ALKPHOS 70  BILITOT 1.0  PROT 8.5*  ALBUMIN 3.5   No results for input(s): LIPASE, AMYLASE in the last 168 hours. No results for input(s): AMMONIA in the last 168 hours. Coagulation Profile: Recent Labs  Lab 07/18/21 0612 07/19/21 0318 07/20/21 0349 07/21/21 0342 07/23/21 1933  INR 1.1 1.0 1.0 1.1 1.2   Cardiac Enzymes: No results for input(s): CKTOTAL, CKMB, CKMBINDEX, TROPONINI in the last 168 hours. BNP (last 3 results) No results for input(s): PROBNP in the last 8760 hours. HbA1C: No results for input(s): HGBA1C in the last 72 hours. CBG: No results for input(s): GLUCAP in the last 168 hours. Lipid Profile: No results for input(s): CHOL, HDL, LDLCALC, TRIG, CHOLHDL, LDLDIRECT in the last 72 hours. Thyroid Function Tests: No results for input(s): TSH, T4TOTAL, FREET4, T3FREE, THYROIDAB in the last 72 hours. Anemia Panel: No results for input(s): VITAMINB12, FOLATE, FERRITIN, TIBC, IRON, RETICCTPCT in the last 72 hours. Urine analysis:    Component Value Date/Time   COLORURINE YELLOW 07/23/2021 2250   APPEARANCEUR HAZY (A) 07/23/2021 2250   LABSPEC 1.013 07/23/2021 2250   PHURINE 7.0 07/23/2021 2250   GLUCOSEU NEGATIVE 07/23/2021 2250   HGBUR NEGATIVE 07/23/2021 2250   BILIRUBINUR NEGATIVE 07/23/2021 2250   KETONESUR NEGATIVE 07/23/2021 2250   PROTEINUR NEGATIVE 07/23/2021 2250   NITRITE NEGATIVE 07/23/2021 2250   LEUKOCYTESUR NEGATIVE 07/23/2021 2250    Radiological Exams on Admission - Personally Reviewed: CT HEAD WO CONTRAST (5MM)  Result Date: 07/23/2021 CLINICAL DATA:  Mental status change EXAM: CT HEAD WITHOUT CONTRAST TECHNIQUE: Contiguous axial images were obtained from the base of the skull through the vertex  without intravenous contrast. COMPARISON:  CT brain 06/23/2021, MRI 06/25/2021 FINDINGS: Brain: No acute territorial infarction, hemorrhage or intracranial mass. Mild atrophy. Stable ventricular enlargement. Mild chronic small vessel ischemic changes of the white matter. Vascular: No hyperdense vessels.  Carotid vascular calcification. Skull: Normal. Negative for fracture or focal lesion. Sinuses/Orbits: No acute finding. Other: None IMPRESSION: 1. No CT evidence for acute intracranial abnormality. 2. Similar atrophy. Similar degree of ventricular enlargement probably due to atrophy though with normal pressure hydrocephalus also considered. This finding is not significantly changed Electronically Signed   By: Jasmine PangKim  Fujinaga M.D.   On: 07/23/2021 22:14   DG  Chest Port 1 View  Result Date: 07/23/2021 CLINICAL DATA:  Altered mental status EXAM: PORTABLE CHEST 1 VIEW COMPARISON:  07/06/2021 FINDINGS: The heart size and mediastinal contours are within normal limits. Aortic atherosclerosis. Both lungs are clear. The visualized skeletal structures are unremarkable. IMPRESSION: No active disease. Electronically Signed   By: Jasmine Pang M.D.   On: 07/23/2021 20:46   CT Renal Stone Study  Result Date: 07/23/2021 CLINICAL DATA:  History of retroperitoneal bleed EXAM: CT ABDOMEN AND PELVIS WITHOUT CONTRAST TECHNIQUE: Multidetector CT imaging of the abdomen and pelvis was performed following the standard protocol without IV contrast. COMPARISON:  CT 06/21/1999 IV FINDINGS: Lower chest: Lung bases demonstrate no acute consolidation or effusion. Coronary vascular calcification. Hepatobiliary: No focal liver abnormality is seen. Status post cholecystectomy. No biliary dilatation. Pancreas: Unremarkable. No pancreatic ductal dilatation or surrounding inflammatory changes. Spleen: Normal in size without focal abnormality. Adrenals/Urinary Tract: Adrenal glands are normal. 9 cm right renal cyst. Small nonobstructing stone in  the right kidney. No hydronephrosis. 10 mm stone in the bladder. Bladder slightly thick walled Stomach/Bowel: Stomach contains percutaneous gastrostomy tube. No dilated small bowel. No acute bowel wall thickening. Negative appendix. Diverticular disease of the left colon. Vascular/Lymphatic: Mild aortic atherosclerosis. No aneurysm. No suspicious nodes Reproductive: Prostate is unremarkable. Other: Negative for free air or free fluid. Fat containing left inguinal hernia Musculoskeletal: Degenerative changes of the spine. Residual asymmetric enlargement of the right greater than left psoas consistent with resolving retroperitoneal hematoma. This has decreased compared to the prior exam. IMPRESSION: 1. No significant hydronephrosis.  10 mm stone in the bladder. 2. Residual asymmetric enlargement of right greater than left psoas muscle though decreased compared to prior CT from July, consistent with resolving retroperitoneal hematoma. 3. 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 4. Nonobstructing right kidney stone Electronically Signed   By: Jasmine Pang M.D.   On: 07/23/2021 22:01    EKG: Personally reviewed.  Rhythm is sinus tachycardia with heart rate of 111 bpm.  No dynamic ST segment changes appreciated.  Assessment/Plan Principal Problem:   Sepsis secondary to acute right-sided suppurative parotitis  Patient presenting with multiple SIRS criteria including fever tachycardia tachypnea and leukocytosis with concurrent lactic acidosis and acute metabolic encephalopathy all concerning for developing sepsis Source is possibly extensive right suppurative parotitis based on physical examination Placing patient on broad-spectrum intravenous antibiotics including intravenous Unasyn with addition of intravenous vancomycin due to recent history of MRSA pneumonia making MRSA possible infectious agent Obtaining CT imaging of the soft tissues of the neck to include cuts of the right  parotid to evaluate for any evidence of impacted stone or abscess formation. Hydrating patient aggressively, patient is already received 3 L of lactated Ringer solution via bolus Blood and urine cultures obtained Admitting patient to progressive unit for close clinical monitoring COVID-19 testing negative.  Urinalysis reveals no evidence of urinary tract infection.  Chest x-ray reveals no evidence of pneumonia.  CT imaging of the abdomen reveals no obvious evidence of infection.  Active Problems:     Acute metabolic encephalopathy  While patient never quite returned to his baseline since his cardiac arrest in June, daughter does note the patient is typically able to follow along with normal daily conversation albeit with some limitation in vocabulary Current presentation is a stark change from patient's new baseline suggestive of a superimposed acute metabolic encephalopathy likely caused by sepsis and volume depletion Treating underlying infection with broad-spectrum antibiotics and hydrated with intravenous fluids CT  head reveals no acute disease Will monitor for resolution of encephalopathy with treating underlying infection    Lactic acidosis  Notable lactic acidosis of 2.6 on arrival likely secondary to sepsis Treating with intravenous fluids and broad-spectrum intravenous antibiotic therapy Performing serial lactic acid levels to ensure downtrending and resolution    Protein calorie malnutrition (HCC)  Patient is status post PEG tube placement by interventional radiology during his hospital stay at [facility] on 06/16/2021 Patient currently receiving tube feeds with Nepro 40 cc an hour with 45 cc tap water flushes every 2 hours Will request nutrition consultation for review of these tube feeds and adjustment as necessary.  Their input is appreciated.    GERD without esophagitis  Continue liquid Protonix daily per home regimen    Hypokalemia  Replacing with oral potassium  chloride via PEG tube Continue magnesium level Monitoring potassium levels with serial chemistries    Essential hypertension Continue home regimen antihypertensive therapy Providing as needed intravenous antihypertensives for markedly elevated blood pressure   Code Status:  Full code -while there is record that patient has been DNR in the past daughter states that patient is full code via her phone conversation. Family Communication: Via phone conversation.Daughter has been updated on plan of care  Status is: Inpatient  Remains inpatient appropriate because:Ongoing diagnostic testing needed not appropriate for outpatient work up, IV treatments appropriate due to intensity of illness or inability to take PO, and Inpatient level of care appropriate due to severity of illness  Dispo: The patient is from:  SNF              Anticipated d/c is to: SNF              Patient currently is not medically stable to d/c.   Difficult to place patient No        Marinda Elk MD [facility] Pager [phone]  If 7PM-7AM, please contact night-coverage [email or web address] Use universal [place] password for that web site. If you do not have the password, please call the hospital operator.  07/24/2021, 2:43 AM

## 2021-07-24 NOTE — Consult Note (Signed)
Reason for Consult: Neck infection Referring Physician: [name] Barnett is an 75 y.o. male.  HPI: History of cardiac arrest a couple of months ago.  Admitted back late last night with cellulitis of the neck.  Concerned about possible airway swelling.  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 He has never used smokeless tobacco. He reports that he does not drink alcohol and does not use drugs.  Allergies:  Allergies  Allergen Reactions   Codeine Rash   Furosemide Rash   Ibuprofen Rash   Meperidine Rash   Tape Other (See Comments)    Other reaction(s): Redness    Medications: Reviewed  Results for orders placed or performed during the hospital encounter of 07/23/21 (from the past 48 hour(s))  Resp Panel by RT-PCR (Flu A&B, Covid) Nasopharyngeal Swab     Status: None   Collection Time: 07/23/21  7:33 PM   Specimen: Nasopharyngeal Swab; Nasopharyngeal(NP) swabs in vial transport medium  Result Value Ref Range   SARS Coronavirus 2 by RT PCR NEGATIVE NEGATIVE    Comment: (NOTE) SARS-CoV-2 target nucleic acids are NOT DETECTED.  The SARS-CoV-2 RNA is generally detectable in upper respiratory specimens during the acute phase of infection. The lowest concentration of SARS-CoV-2 viral copies this assay can detect is 138 copies/mL. A negative result does not preclude SARS-Cov-2 infection and should not be used as the sole basis for treatment or other patient management decisions. A negative result may occur with  improper specimen collection/handling,  submission of specimen other than nasopharyngeal swab, presence of viral mutation(s) within the areas targeted by this assay, and inadequate number of viral copies(<138 copies/mL). A negative result must be combined with clinical observations, patient history, and epidemiological information. The expected result is Negative.  Fact Sheet for Patients:  BloggerCourse.com  Fact Sheet for Healthcare Providers:  SeriousBroker.it  This test is no t yet approved or cleared by the [place] FDA and  has been authorized for detection and/or diagnosis of SARS-CoV-2 by FDA under an Emergency Use Authorization (EUA). This EUA will remain  in effect (meaning this test can be used) for the duration of the COVID-19 declaration under Section 564(b)(1) of the Act, 21 U.S.C.section 360bbb-3(b)(1), unless the authorization is terminated  or revoked sooner.       Influenza A by PCR NEGATIVE NEGATIVE   Influenza B by PCR NEGATIVE NEGATIVE    Comment: (NOTE) The Xpert Xpress SARS-CoV-2/FLU/RSV plus assay is intended as an aid in the diagnosis of influenza from Nasopharyngeal swab specimens and should not be used as a sole basis for treatment. Nasal washings and aspirates are unacceptable for Xpert Xpress SARS-CoV-2/FLU/RSV testing.  Fact Sheet for Patients: BloggerCourse.com  Fact Sheet for Healthcare Providers: SeriousBroker.it  This test is not yet approved or cleared by the [place] FDA and has been authorized for detection and/or diagnosis of SARS-CoV-2 by FDA under an Emergency Use Authorization (EUA). This EUA will remain in effect (meaning this test can be used) for the duration of the COVID-19 declaration under Section 564(b)(1)  of the Act, 21 U.S.C. section 360bbb-3(b)(1), unless the authorization is terminated or revoked.  Performed at [facility], [address],  [place], [place] [address]   Lactic acid, plasma     Status: Abnormal   Collection Time: 07/23/21  7:33 PM  Result Value Ref Range   Lactic Acid, Venous 2.6 (HH) 0.5 - 1.9 mmol/L    Comment: CRITICAL RESULT CALLED TO, READ BACK BY AND VERIFIED WITH: D. HARRIS, RN.  [id number] BLANKENSHIP R.  Performed at [facility], [address], [place], [place] [address]   Comprehensive metabolic panel     Status: Abnormal   Collection Time: 07/23/21  7:33 PM  Result Value Ref Range   Sodium 144 135 - 145 mmol/L   Potassium 3.7 3.5 - 5.1 mmol/L   Chloride 103 98 - 111 mmol/L   CO2 33 (H) 22 - 32 mmol/L   Glucose, Bld 147 (H) 70 - 99 mg/dL    Comment: Glucose reference range applies only to samples taken after fasting for at least 8 hours.   BUN 27 (H) 8 - 23 mg/dL   Creatinine, Ser 1.32 (H) 0.61 - 1.24 mg/dL   Calcium 44.0 (H) 8.9 - 10.3 mg/dL   Total Protein 8.5 (H) 6.5 - 8.1 g/dL   Albumin 3.5 3.5 - 5.0 g/dL   AST 26 15 - 41 U/L   ALT 17 0 - 44 U/L   Alkaline Phosphatase 70 38 - 126 U/L   Total Bilirubin 1.0 0.3 - 1.2 mg/dL   GFR, Estimated 51 (L) >60 mL/min    Comment: (NOTE) Calculated using the CKD-EPI Creatinine Equation (2021)    Anion gap 8 5 - 15    Comment: Performed at [facility], [address], [place], [place] [address]  CBC WITH DIFFERENTIAL     Status: Abnormal   Collection Time: 07/23/21  7:33 PM  Result Value Ref Range   WBC 16.0 (H) 4.0 - 10.5 K/uL   RBC 4.77 4.22 - 5.81 MIL/uL   Hemoglobin 13.9 13.0 - 17.0 g/dL   HCT 53.6 64.4 - 03.4 %   MCV 93.9 80.0 - 100.0 fL   MCH 29.1 26.0 - 34.0 pg   MCHC 31.0 30.0 - 36.0 g/dL   RDW 74.2 (H) 59.5 - 63.8 %   Platelets 269 150 - 400 K/uL   nRBC 0.0 0.0 - 0.2 %   Neutrophils Relative % 83 %   Neutro Abs 13.4 (H) 1.7 - 7.7 K/uL   Lymphocytes Relative 8 %   Lymphs Abs 1.3 0.7 - 4.0 K/uL   Monocytes Relative 7 %   Monocytes Absolute 1.1 (H) 0.1 - 1.0 K/uL   Eosinophils Relative 1 %   Eosinophils Absolute 0.1  0.0 - 0.5 K/uL   Basophils Relative 0 %   Basophils Absolute 0.1 0.0 - 0.1 K/uL   Immature Granulocytes 1 %   Abs Immature Granulocytes 0.08 (H) 0.00 - 0.07 K/uL    Comment: Performed at [facility], [address] [place], [place] [address]  Protime-INR     Status: None   Collection Time: 07/23/21  7:33 PM  Result Value Ref Range   Prothrombin Time 14.9 11.4 - 15.2 seconds   INR 1.2 0.8 - 1.2    Comment: (NOTE) INR goal varies based on device and disease states. Performed at [facility], [address] [place], [place] [address]   APTT  Status: Abnormal   Collection Time: 07/23/21  7:33 PM  Result Value Ref Range   aPTT 38 (H) 24 - 36 seconds    Comment:        IF BASELINE aPTT IS ELEVATED, SUGGEST PATIENT RISK ASSESSMENT BE USED TO DETERMINE APPROPRIATE ANTICOAGULANT THERAPY. Performed at [facility], [address], [place], [place] [address]   Blood Culture (routine x 2)     Status: None (Preliminary result)   Collection Time: 07/23/21  7:45 PM   Specimen: BLOOD  Result Value Ref Range   Specimen Description BLOOD RIGHT ARM    Special Requests      BOTTLES DRAWN AEROBIC AND ANAEROBIC Blood Culture results may not be optimal due to an inadequate volume of blood received in culture bottles   Culture      NO GROWTH < 24 HOURS Performed at [facility], [address] [place], [place] [address]    Report Status PENDING   Blood Culture (routine x 2)     Status: None (Preliminary result)   Collection Time: 07/23/21  7:50 PM   Specimen: BLOOD  Result Value Ref Range   Specimen Description BLOOD RIGHT FOREARM    Special Requests      BOTTLES DRAWN AEROBIC AND ANAEROBIC Blood Culture results may not be optimal due to an inadequate volume of blood received in culture bottles   Culture      NO GROWTH < 24 HOURS Performed at [facility], [address] [place], [place] [address]    Report Status PENDING   I-stat chem 8, ED (not at  [facility] or [facility])     Status: Abnormal   Collection Time: 07/23/21  8:26 PM  Result Value Ref Range   Sodium 146 (H) 135 - 145 mmol/L   Potassium 4.0 3.5 - 5.1 mmol/L   Chloride 106 98 - 111 mmol/L   BUN 35 (H) 8 - 23 mg/dL   Creatinine, Ser 9.14 (H) 0.61 - 1.24 mg/dL   Glucose, Bld 782 (H) 70 - 99 mg/dL    Comment: Glucose reference range applies only to samples taken after fasting for at least 8 hours.   Calcium, Ion 1.34 1.15 - 1.40 mmol/L   TCO2 34 (H) 22 - 32 mmol/L   Hemoglobin 13.3 13.0 - 17.0 g/dL   HCT 95.6 21.3 - 08.6 %  Lactic acid, plasma     Status: Abnormal   Collection Time: 07/23/21  9:10 PM  Result Value Ref Range   Lactic Acid, Venous 2.2 (HH) 0.5 - 1.9 mmol/L    Comment: CRITICAL VALUE NOTED.  VALUE IS CONSISTENT WITH PREVIOUSLY REPORTED AND CALLED VALUE. Performed at [facility], [address], [place], [place] [address]   Urinalysis, Routine w reflex microscopic     Status: Abnormal   Collection Time: 07/23/21 10:50 PM  Result Value Ref Range   Color, Urine YELLOW YELLOW   APPearance HAZY (A) CLEAR   Specific Gravity, Urine 1.013 1.005 - 1.030   pH 7.0 5.0 - 8.0   Glucose, UA NEGATIVE NEGATIVE mg/dL   Hgb urine dipstick NEGATIVE NEGATIVE   Bilirubin Urine NEGATIVE NEGATIVE   Ketones, ur NEGATIVE NEGATIVE mg/dL   Protein, ur NEGATIVE NEGATIVE mg/dL   Nitrite NEGATIVE NEGATIVE   Leukocytes,Ua NEGATIVE NEGATIVE    Comment: Performed at [facility], [address], [place], [place] [address]  Protime-INR     Status: Abnormal   Collection Time:  07/24/21  5:00 AM  Result Value Ref Range   Prothrombin Time 15.4 (H) 11.4 - 15.2 seconds   INR 1.2 0.8 - 1.2    Comment: (NOTE) INR goal varies based on device and disease states. Performed at [facility], [address] [place], [place] [address]   Cortisol-am, blood     Status: Abnormal   Collection Time: 07/24/21  5:00 AM  Result Value Ref Range   Cortisol - AM 30.0 (H) 6.7 - 22.6 ug/dL     Comment: Performed at [facility], [address] [place], [place] [address]  Procalcitonin     Status: None   Collection Time: 07/24/21  5:00 AM  Result Value Ref Range   Procalcitonin 0.48 ng/mL    Comment:        Interpretation: PCT (Procalcitonin) <= 0.5 ng/mL: Systemic infection (sepsis) is not likely. Local bacterial infection is possible. (NOTE)       Sepsis PCT Algorithm           Lower Respiratory Tract                                      Infection PCT Algorithm    ----------------------------     ----------------------------         PCT < 0.25 ng/mL                PCT < 0.10 ng/mL          Strongly encourage             Strongly discourage   discontinuation of antibiotics    initiation of antibiotics    ----------------------------     -----------------------------       PCT 0.25 - 0.50 ng/mL            PCT 0.10 - 0.25 ng/mL               OR       >80% decrease in PCT            Discourage initiation of                                            antibiotics      Encourage discontinuation           of antibiotics    ----------------------------     -----------------------------         PCT >= 0.50 ng/mL              PCT 0.26 - 0.50 ng/mL               AND        <80% decrease in PCT             Encourage initiation of                                             antibiotics       Encourage continuation           of antibiotics    ----------------------------     -----------------------------        PCT >= 0.50  ng/mL                  PCT > 0.50 ng/mL               AND         increase in PCT                  Strongly encourage                                      initiation of antibiotics    Strongly encourage escalation           of antibiotics                                     -----------------------------                                           PCT <= 0.25 ng/mL                                                 OR                                        >  80% decrease in PCT                                      Discontinue / Do not initiate                                             antibiotics  Performed at [facility], [address], [place], [place] [address]   Comprehensive metabolic panel     Status: Abnormal   Collection Time: 07/24/21  5:00 AM  Result Value Ref Range   Sodium 142 135 - 145 mmol/L   Potassium 3.3 (L) 3.5 - 5.1 mmol/L   Chloride 103 98 - 111 mmol/L   CO2 27 22 - 32 mmol/L   Glucose, Bld 123 (H) 70 - 99 mg/dL    Comment: Glucose reference range applies only to samples taken after fasting for at least 8 hours.   BUN 24 (H) 8 - 23 mg/dL   Creatinine, Ser 6.04 (H) 0.61 - 1.24 mg/dL    Comment: DELTA CHECK NOTED   Calcium 11.0 (H) 8.9 - 10.3 mg/dL   Total Protein 7.4 6.5 - 8.1 g/dL   Albumin 2.9 (L) 3.5 - 5.0 g/dL   AST 21 15 - 41 U/L   ALT 17 0 - 44 U/L   Alkaline Phosphatase 66 38 - 126 U/L   Total Bilirubin 1.1 0.3 - 1.2 mg/dL   GFR, Estimated 57 (L) >60 mL/min    Comment: (NOTE) Calculated using the CKD-EPI Creatinine Equation (2021)    Anion  gap 12 5 - 15    Comment: Performed at [facility], [address], [place], [place] [address]  Magnesium     Status: None   Collection Time: 07/24/21  5:00 AM  Result Value Ref Range   Magnesium 1.7 1.7 - 2.4 mg/dL    Comment: Performed at [facility], [address], [place], [place] [address]  CBC WITH DIFFERENTIAL     Status: Abnormal   Collection Time: 07/24/21  5:00 AM  Result Value Ref Range   WBC 17.1 (H) 4.0 - 10.5 K/uL   RBC 4.29 4.22 - 5.81 MIL/uL   Hemoglobin 12.4 (L) 13.0 - 17.0 g/dL   HCT 09.8 11.9 - 14.7 %   MCV 94.6 80.0 - 100.0 fL   MCH 28.9 26.0 - 34.0 pg   MCHC 30.5 30.0 - 36.0 g/dL   RDW 82.9 (H) 56.2 - 13.0 %   Platelets 214 150 - 400 K/uL   nRBC 0.0 0.0 - 0.2 %   Neutrophils Relative % 83 %   Neutro Abs 14.3 (H) 1.7 - 7.7 K/uL   Lymphocytes Relative 7 %   Lymphs Abs 1.2 0.7 - 4.0 K/uL   Monocytes Relative 8 %    Monocytes Absolute 1.4 (H) 0.1 - 1.0 K/uL   Eosinophils Relative 0 %   Eosinophils Absolute 0.1 0.0 - 0.5 K/uL   Basophils Relative 1 %   Basophils Absolute 0.1 0.0 - 0.1 K/uL   Immature Granulocytes 1 %   Abs Immature Granulocytes 0.09 (H) 0.00 - 0.07 K/uL    Comment: Performed at [facility], [address] [place], [place] [address]    CT HEAD WO CONTRAST ( )  Result Date: 07/23/2021 CLINICAL DATA:  Mental status change EXAM: CT HEAD WITHOUT CONTRAST TECHNIQUE: Contiguous axial images were obtained from the base of the skull through the vertex without intravenous contrast. COMPARISON:  CT brain 06/23/2021, MRI 06/25/2021 FINDINGS: Brain: No acute territorial infarction, hemorrhage or intracranial mass. Mild atrophy. Stable ventricular enlargement. Mild chronic small vessel ischemic changes of the white matter. Vascular: No hyperdense vessels.  Carotid vascular calcification. Skull: Normal. Negative for fracture or focal lesion. Sinuses/Orbits: No acute finding. Other: None IMPRESSION: 1. No CT evidence for acute intracranial abnormality. 2. Similar atrophy. Similar degree of ventricular enlargement probably due to atrophy though with normal pressure hydrocephalus also considered. This finding is not significantly changed Electronically Signed   By: Jasmine Pang M.D.   On: 07/23/2021 22:14   CT SOFT TISSUE NECK W CONTRAST  Result Date: 07/24/2021 CLINICAL DATA:  75 year old male with  Septic Parotitis. EXAM: CT NECK WITH CONTRAST TECHNIQUE: Multidetector CT imaging of the neck was performed using the standard protocol following the bolus administration of intravenous contrast. CONTRAST:  80mL OMNIPAQUE IOHEXOL 350 MG/ML SOLN COMPARISON:  Brain MRI 06/25/2021. FINDINGS: Pharynx and larynx: There is bulky mucosal space edema extending along the right lateral wall of the pharynx to the supraglottic larynx. There is leftward midline shift of the airway at the level of the hyoid bone  (series 4, image 89). Effaced right para form sinus. The nasopharynx is relatively spared. Despite this mucosal edema the right parapharyngeal space appears only mildly inflamed. No masslike enhancement of the larynx or pharynx. Left parapharyngeal and retropharyngeal spaces remain within normal limits. Salivary glands: Bulky, enlarged, and inflamed appearing right parotid gland (series 4, image 66). This resembles the appearance of the abnormal left parotid gland from the MRI last month,  although the left parotid has normalized. No discrete right parotid mass. Regional soft tissue swelling and stranding tracking from the right parotid up the temporalis muscle and into the right submandibular space. The right submandibular gland is asymmetrically enlarged and hyperenhancing. No enlargement of the right parotid duct is evident. No enlargement of the right submandibular duct. No sialolithiasis. The sublingual space appears relatively spared at this time. There is mild inflammation of the right masticator space. The left submandibular gland and parotid appear normal. Thyroid: Negative. Lymph nodes: Small but hyperenhancing right level 1 through level 3 lymph nodes appear reactive. No enlarged, cystic or necrotic lymph nodes in the neck. Vascular: The major vascular structures in the neck and at the skull base remain patent, although the right IJ is non dominant and diminutive. Bulky cervical carotid calcified plaque at the bifurcations with hemodynamically significant stenosis of the proximal left ICA on series 4, image 76. Tortuous ICAs just below the skull base. The left vertebral artery appears slightly dominant. Limited intracranial: Calcified atherosclerosis at the skull base. Stable appearance of the visible brain parenchyma. Visualized orbits: Disconjugate gaze and chronic postoperative changes to the globes, otherwise negative orbits. Mastoids and visualized paranasal sinuses: Visualized paranasal sinuses and  mastoids are clear. Skeleton: No acute dental finding. Ordinary cervical spine degeneration. Bulky upper cervical facet disease and lower cervical endplate spurring. Advanced degenerative osseous changes about the left shoulder. No acute or suspicious osseous lesion. Upper chest: Lungs appear clear with mild respiratory motion. Calcified aortic atherosclerosis. No superior mediastinal lymphadenopathy. IMPRESSION: 1. Multispatial inflammation and edema in the right face and neck, moderately involving the right lateral pharyngeal wall and the supraglottic larynx. However, no airway compromise at this time.  No associated abscess. 2. Multifocal right side sallow adenitis, with bulky and inflamed right parotid gland appearing similar to the abnormal left parotid on the brain MRI last month - although the left parotid has since normalized. Inflamed right submandibular gland. No sialolithiasis or obstructing etiology identified. 3. Small but reactive appearing right cervical lymph nodes. 4. Bulky calcified carotid plaque with hemodynamically significant proximal Left ICA stenosis. 5. Aortic Atherosclerosis (ICD10-I70.0). Electronically Signed   By: Odessa FlemingH  Hall M.D.   On: 07/24/2021 07:07   DG Chest Port 1 View  Result Date: 07/23/2021 CLINICAL DATA:  Altered mental status EXAM: PORTABLE CHEST 1 VIEW COMPARISON:  07/06/2021 FINDINGS: The heart size and mediastinal contours are within normal limits. Aortic atherosclerosis. Both lungs are clear. The visualized skeletal structures are unremarkable. IMPRESSION: No active disease. Electronically Signed   By: Jasmine PangKim  Fujinaga M.D.   On: 07/23/2021 20:46   CT Renal Stone Study  Result Date: 07/23/2021 CLINICAL DATA:  History of retroperitoneal bleed EXAM: CT ABDOMEN AND PELVIS WITHOUT CONTRAST TECHNIQUE: Multidetector CT imaging of the abdomen and pelvis was performed following the standard protocol without IV contrast. COMPARISON:  CT 06/21/1999 IV FINDINGS: Lower chest: Lung  bases demonstrate no acute consolidation or effusion. Coronary vascular calcification. Hepatobiliary: No focal liver abnormality is seen. Status post cholecystectomy. No biliary dilatation. Pancreas: Unremarkable. No pancreatic ductal dilatation or surrounding inflammatory changes. Spleen: Normal in size without focal abnormality. Adrenals/Urinary Tract: Adrenal glands are normal. 9 cm right renal cyst. Small nonobstructing stone in the right kidney. No hydronephrosis. 10 mm stone in the bladder. Bladder slightly thick walled Stomach/Bowel: Stomach contains percutaneous gastrostomy tube. No dilated small bowel. No acute bowel wall thickening. Negative appendix. Diverticular disease of the left colon. Vascular/Lymphatic: Mild aortic atherosclerosis. No aneurysm. No suspicious nodes  Reproductive: Prostate is unremarkable. Other: Negative for free air or free fluid. Fat containing left inguinal hernia Musculoskeletal: Degenerative changes of the spine. Residual asymmetric enlargement of the right greater than left psoas consistent with resolving retroperitoneal hematoma. This has decreased compared to the prior exam. IMPRESSION: 1. No significant hydronephrosis.  10 mm stone in the bladder. 2. Residual asymmetric enlargement of right greater than left psoas muscle though decreased compared to prior CT from July, consistent with resolving retroperitoneal hematoma. 3. 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 4. Nonobstructing right kidney stone Electronically Signed   By: Jasmine Pang M.D.   On: 07/23/2021 22:01    EHU:[id number] except as listed in admit H&P  Blood pressure (!) 123/58, pulse (!) 106, temperature 98.4 F (36.9 C), temperature source Axillary, resp. rate 17, height 5\' 10"  (1.778 m), weight 96.6 kg, SpO2 98 %.  PHYSICAL EXAM: Overall appearance: Ill-appearing, obtunded, elderly gentleman in respiratory distress.  He is not having stridor but is tachypneic.  There are  some audible wheezes. Head:  Normocephalic, atraumatic. Ears: External ears look healthy. Nose: External nose is healthy in appearance. Internal nasal exam free of any lesions or obstruction. Oral Cavity/Pharynx:  There are no mucosal lesions or masses identified.  There is extreme dryness of the oral mucosa diffusely.  There is no edema of the pharyngeal mucosa. Larynx/Hypopharynx: Deferred Neuro: Obtunded. Neck: There is diffuse erythema and firm edema involving the entire right parotid area and into the right side of the neck.  There is no obvious fluctuance..  Studies Reviewed: CT neck.  IMPRESSION: 1. Multispatial inflammation and edema in the right face and neck, moderately involving the right lateral pharyngeal wall and the supraglottic larynx. However, no airway compromise at this time.  No associated abscess.   2. Multifocal right side sallow adenitis, with bulky and inflamed right parotid gland appearing similar to the abnormal left parotid on the brain MRI last month - although the left parotid has since normalized. Inflamed right submandibular gland. No sialolithiasis or obstructing etiology identified.   3. Small but reactive appearing right cervical lymph nodes.   4. Bulky calcified carotid plaque with hemodynamically significant proximal Left ICA stenosis.   5. Aortic Atherosclerosis (ICD10-I70.0).  Procedures: none   Assessment/Plan: Diffuse cellulitis probably originating in the right parotid.  Based on the previous imaging there was a similar process of the left parotid recently.  There is no obvious abscess identified.  There is no airway compromise by imaging or by clinical examination.  Given his obtunded state and respiratory distress I suspect he will require intubation at some point.  Clinical exam reveals no obvious difficulties with intubation if necessary.  No surgical drainage needed.  Continue with IV antibiotics.  Reconsult if  needed.   K11.20  V78.588 07/24/2021, 5:30 PM

## 2021-07-24 NOTE — Progress Notes (Signed)
Pharmacy Antibiotic Note  Benjamin Barnett is a 75 y.o. male admitted on 07/23/2021 with sepsis.  Pharmacy has been consulted for Unasyn dosing for ?septic parotitis. WBC elevated. Mild bump in Scr. Already on vancomycin.   Plan: Already on Vancomycin Add Unasyn 3g IV q8h Trend WBC, temp, renal function  F/U infectious work-up Drug levels as indicated   Height: 5\' 10"  (177.8 cm) Weight: 96.6 kg (212 lb 15.4 oz) IBW/kg (Calculated) : 73  Temp (24hrs), Avg:100.8 F (38.2 C), Min:99.2 F (37.3 C), Max:102.2 F (39 C)  Recent Labs  Lab 07/20/21 0349 07/22/21 1156 07/23/21 0517 07/23/21 1933 07/23/21 2026 07/23/21 2110  WBC  --  6.0  --  16.0*  --   --   CREATININE 1.29* 1.21 1.34* 1.46* 1.30*  --   LATICACIDVEN  --   --   --  2.6*  --  2.2*    Estimated Creatinine Clearance: 59.9 mL/min (A) (by C-G formula based on SCr of 1.3 mg/dL (H)).    Allergies  Allergen Reactions   Codeine Rash   Furosemide Rash   Ibuprofen Rash   Meperidine Rash   Tape Other (See Comments)    Other reaction(s): Redness    09/22/21, PharmD, BCPS Clinical Pharmacist Phone: [phone]

## 2021-07-24 NOTE — Progress Notes (Signed)
Per admitting Physician, Daughter wished to make pt a full code despite paper DNR in pt chart. MD Arrien at bedside, spoke with daughter via phone to confirm. CCMD paged @ [phone]. Rapid RN Cherly Hensen escalated to respiratory. See new orders for escalation in level of patient care.    07/24/21 1625  Assess: MEWS Score  Temp 98.4 F (36.9 C)  BP (!) 123/58  Pulse Rate (!) 105  ECG Heart Rate (!) 105  Resp (!) 21  Level of Consciousness Responds to Pain  SpO2 92 %  O2 Device Room Air  Assess: MEWS Score  MEWS Temp 0  MEWS Systolic 0  MEWS Pulse 1  MEWS RR 1  MEWS LOC 2  MEWS Score 4  MEWS Score Color Red  Assess: if the MEWS score is Yellow or Red  Were vital signs taken at a resting state? Yes  Focused Assessment Change from prior assessment (see assessment flowsheet)  Early Detection of Sepsis Score *See Row Information* High  MEWS guidelines implemented *See Row Information* Yes  Treat  Pain Scale CPOT  Breathing 1  Negative Vocalization 1  Facial Expression 0  Body Language 1  Consolability 0  PAINAD Score 3  Facial Expression 0  Body Movements 2  Muscle Tension 1  Compliance with ventilator (intubated pts.) N/A  Vocalization (extubated pts.) 1  CPOT Total 4  Escalate  MEWS: Escalate Red: discuss with charge nurse/RN and provider, consider discussing with RRT  Notify: Charge Nurse/RN  Name of Charge Nurse/RN Notified April Cooper, RN  Date Charge Nurse/RN Notified 07/24/21  Time Charge Nurse/RN Notified 1635  Notify: Provider  Provider Name/Title M. Arrien  Date Provider Notified 07/24/21  Time Provider Notified 1638  Notification Type Page  Notification Reason Change in status  Provider response At bedside  Date of Provider Response 07/24/21  Notify: Rapid Response  Name of Rapid Response RN Notified Patick Dickinson  Date Rapid Response Notified 07/24/21  Time Rapid Response Notified 1647  Document  Patient Outcome Transferred/level of care increased

## 2021-07-24 NOTE — Consult Note (Signed)
NAME:  Benjamin Barnett, MRN:  [id number], DOB:  Feb 02, 1946, LOS: 1 ADMISSION DATE:  07/23/2021, CONSULTATION DATE:  07/24/2021 REFERRING MD:  Dr. Cathlean Sauer, CHIEF COMPLAINT:  Neck cellulitis with parotiditis   History of Present Illness:  Benjamin Barnett is a 75 y.o. male with a PMH significant for prior PEA arrest, retroperitoneal hematoma,  HTN, GERD, and prior MRSA pneumonia that presented to the ED from [facility] for complaints of AMS. Patient was recently discharged to SNF after prolonged hospital stay after suffering a PEA arrest and retroperitoneal hematoma.  Patient was only at SNF for approx 1 hr before EMS was called.    On ED arrival patient meet criteria for sepsis and was started on broad spectrum antibiotics for concern of acute parotitis and admitted per [facility] team to progressive care. Post PEA arrest patient was made a DNR but on ED arrival patient's daughter reversed code status to full.   PCCM called when patient was transferred from ED to progressive care due to acute concern for ability to protect airway. On presentation patient is seen very somnolent with bilateral rhonchi and severely swollen right face and neck with erythema and induration. Urgent transfer to ICU with ENT consult   Pertinent  Medical History  PEA arrest Retroperitoneal hematoma HTN  GERD  MRSA pneumonia  Significant Hospital Events:  8/12 admitted for sepsis secondary to parotitis and facial cellulitis   Interim History / Subjective:  As above   Objective   Blood pressure (!) 123/58, pulse (!) 106, temperature 98.4 F (36.9 C), temperature source Axillary, resp. rate 17, height 5' 10"  (1.778 m), weight 96.6 kg, SpO2 98 %.        Intake/Output Summary (Last 24 hours) at 07/24/2021 1712 Last data filed at 07/24/2021 1543 Gross per 24 hour  Intake 2100 ml  Output 1375 ml  Net 725 ml   Filed Weights   07/23/21 2059  Weight: 96.6 kg    Examination: General: Acute on  chronically ill appearing elderly male lying in bed on NRB in moderate respiratory distress  HEENT: Severe erythema with facial and neck swelling and induration (see image below) PERRL,  Neuro: Somnolent   CV: s1s2 regular rate and rhythm, no murmur, rubs, or gallops,  PULM:  Bilateral rhonchi with tachypnea and accessory muscle use  GI: soft, bowel sounds active in all 4 quadrants, non-tender, non-distended Extremities: warm/dry, no edema  Skin: no rashes or lesions   Resolved Hospital Problem list     Assessment & Plan:  Sever sepsis secondary to parotitis and facial cellulitis  -Patient met criteria for sepsis on admission with fever of temp 102.1, tachycardia HR 112, leukocytosis with WBC 16.0, acute concern for active infection, and lactic acid of 2.6 P: Transfer to ICU Emergently intubate  Pan cultures prior to antibiotic Continue IV Unasyn and Vancomycin  Aggressive IV hydration provided on admission  Trend lactic acid  Monitor urine output ENT consulted and following   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 P: Maintain neuro protective measures; goal for eurothermia, euglycemia, eunatermia, normoxia, and PCO2 goal of 35-40 Nutrition and bowel regiment  Seizure precautions  Aspirations precautions  Minimize sedation   Hypertensive urgency with Hx of HTN  -Home medications include Norvasc, lopressor P: Ensure adequate sedation  PRN IV hydralazine  May need IV Clevidipine Continuous telemetry    Hypokalemia  P: Supplement  Trend Bmet   Deconditioning At risk protein malnutrition  P: PT/OT efforts as able  Tube  feed  Protein supplementation  Hx of DVT and retroperitoneal bleed  P: Subq heparin      Best Practice   Diet/type: tubefeeds DVT prophylaxis: LMWH GI prophylaxis: PPI Lines: N/A Foley:  N/A Code Status:  full code Last date of multidisciplinary goals of care discussion: Confirmed with Daughter  Estill Bamberg that patient is a FULL code and  that family would want all aggressive measure include intubation.   Labs   CBC: Recent Labs  Lab 07/22/21 1156 07/23/21 1933 07/23/21 2026 07/24/21 0500  WBC 6.0 16.0*  --  17.1*  NEUTROABS  --  13.4*  --  14.3*  HGB 12.1* 13.9 13.3 12.4*  HCT 38.8* 44.8 39.0 40.6  MCV 91.9 93.9  --  94.6  PLT 199 269  --  154    Basic Metabolic Panel: Recent Labs  Lab 07/20/21 0349 07/22/21 1156 07/23/21 0517 07/23/21 1933 07/23/21 2026 07/24/21 0500  NA 138 143 145 144 146* 142  K 3.5 3.1* 3.2* 3.7 4.0 3.3*  CL 102 105 104 103 106 103  CO2 25 33* 34* 33*  --  27  GLUCOSE 87 120* 121* 147* 141* 123*  BUN 33* 24* 26* 27* 35* 24*  CREATININE 1.29* 1.21 1.34* 1.46* 1.30* 1.33*  CALCIUM 10.9* 11.4* 11.3* 11.7*  --  11.0*  MG  --   --   --   --   --  1.7   GFR: Estimated Creatinine Clearance: 58.5 mL/min (A) (by C-G formula based on SCr of 1.33 mg/dL (H)). Recent Labs  Lab 07/22/21 1156 07/23/21 1933 07/23/21 2110 07/24/21 0500  PROCALCITON  --   --   --  0.48  WBC 6.0 16.0*  --  17.1*  LATICACIDVEN  --  2.6* 2.2*  --     Liver Function Tests: Recent Labs  Lab 07/23/21 1933 07/24/21 0500  AST 26 21  ALT 17 17  ALKPHOS 70 66  BILITOT 1.0 1.1  PROT 8.5* 7.4  ALBUMIN 3.5 2.9*   No results for input(s): LIPASE, AMYLASE in the last 168 hours. No results for input(s): AMMONIA in the last 168 hours.  ABG    Component Value Date/Time   PHART 7.464 (H) 05/31/2021 1535   PCO2ART 33.7 05/31/2021 1535   PO2ART 109 (H) 05/31/2021 1535   HCO3 23.9 05/31/2021 1535   TCO2 34 (H) 07/23/2021 2026   O2SAT 98.5 05/31/2021 1535     Coagulation Profile: Recent Labs  Lab 07/19/21 0318 07/20/21 0349 07/21/21 0342 07/23/21 1933 07/24/21 0500  INR 1.0 1.0 1.1 1.2 1.2    Cardiac Enzymes: No results for input(s): CKTOTAL, CKMB, CKMBINDEX, TROPONINI in the last 168 hours.  HbA1C: No results found for: HGBA1C  CBG: No results for input(s): GLUCAP in the last 168  hours.  Review of Systems:   Please see the history of present illness. All other systems reviewed and are negative   Past Medical History:  He,  has a past medical history of Cardiac arrest ([place]) (07/24/2021), Essential hypertension (07/24/2021), GERD without esophagitis (07/24/2021), Gout (07/24/2021), History of MRSA infection of lungs (07/24/2021), and Mixed hyperlipidemia (07/24/2021).   Surgical History:   Past Surgical History:  Procedure Laterality Date   IR GASTROSTOMY TUBE MOD SED  06/16/2021     Social History:   reports that he has never smoked. He has never used smokeless tobacco. He reports that he does not drink alcohol and does not use drugs.   Family History:  His Family history is  unknown by patient.   Allergies Allergies  Allergen Reactions   Codeine Rash   Furosemide Rash   Ibuprofen Rash   Meperidine Rash   Tape Other (See Comments)    Other reaction(s): Redness     Home Medications  Prior to Admission medications   Medication Sig Start Date End Date Taking? Authorizing Provider  amantadine (SYMMETREL) 100 MG capsule Take 100 mg by mouth 2 (two) times daily.   Yes [provider]  Amino Acids-Protein Hydrolys (PRO-STAT) LIQD Take 30 mLs by mouth 2 (two) times daily.   Yes [provider]  amLODipine (NORVASC) 5 MG tablet Take 5 mg by mouth daily. 02/03/21  Yes [provider]  carboxymethylcellulose (REFRESH PLUS) 0.5 % SOLN Apply 1 drop to eye 3 (three) times daily as needed (dry eyes).   Yes [provider]  Cetirizine HCl 10 MG CAPS Take 1 capsule by mouth daily as needed for allergies.   Yes [provider]  ciprofloxacin (CILOXAN) 0.3 % ophthalmic solution Place 1 drop into both eyes 3 (three) times daily.   Yes [provider]  metoCLOPramide (REGLAN) 10 MG tablet Take 10 mg by mouth 4 (four) times daily.   Yes [provider]  metoprolol tartrate (LOPRESSOR) 25 MG tablet Take 25 mg by mouth 2  (two) times daily. 05/14/21  Yes [provider]  mirtazapine (REMERON) 15 MG tablet Take 7.5 mg by mouth at bedtime.   Yes [provider]  modafinil (PROVIGIL) 100 MG tablet Take 100 mg by mouth daily.   Yes [provider]  Multiple Vitamin (MULTIVITAMIN) tablet Take 1 tablet by mouth daily.   Yes [provider]  pantoprazole sodium (PROTONIX) 40 mg/20 mL PACK Take 40 mg by mouth daily.   Yes [provider]  polyethylene glycol (MIRALAX / GLYCOLAX) 17 g packet Take 17 g by mouth daily.   Yes [provider]  QUEtiapine (SEROQUEL) 50 MG tablet Take 50 mg by mouth at bedtime.   Yes [provider]  sertraline (ZOLOFT) 50 MG tablet Take 50 mg by mouth daily.   Yes [provider]     Critical care time:    Performed by: Novalee Horsfall D. Harris  Total critical care time: 55 minutes  Critical care time was exclusive of separately billable procedures and treating other patients.  Critical care was necessary to treat or prevent imminent or life-threatening deterioration.  Critical care was time spent personally by me on the following activities: development of treatment plan with patient and/or surrogate as well as nursing, discussions with consultants, evaluation of patient's response to treatment, examination of patient, obtaining history from patient or surrogate, ordering and performing treatments and interventions, ordering and review of laboratory studies, ordering and review of radiographic studies, pulse oximetry and re-evaluation of patient's condition.  Julliette Frentz D. Kenton Kingfisher, NP-C [facility] Personal contact information can be found on Amion  07/24/2021, 6:33 PM

## 2021-07-25 ENCOUNTER — Inpatient Hospital Stay (HOSPITAL_COMMUNITY): Payer: Medicare Other

## 2021-07-25 DIAGNOSIS — L03211 Cellulitis of face: Secondary | ICD-10-CM | POA: Diagnosis not present

## 2021-07-25 DIAGNOSIS — K1121 Acute sialoadenitis: Secondary | ICD-10-CM

## 2021-07-25 DIAGNOSIS — E46 Unspecified protein-calorie malnutrition: Secondary | ICD-10-CM

## 2021-07-25 DIAGNOSIS — Z8614 Personal history of Methicillin resistant Staphylococcus aureus infection: Secondary | ICD-10-CM

## 2021-07-25 DIAGNOSIS — J9601 Acute respiratory failure with hypoxia: Secondary | ICD-10-CM | POA: Diagnosis not present

## 2021-07-25 DIAGNOSIS — G9341 Metabolic encephalopathy: Secondary | ICD-10-CM | POA: Diagnosis not present

## 2021-07-25 LAB — RENAL FUNCTION PANEL
Albumin: 2.2 g/dL — ABNORMAL LOW (ref 3.5–5.0)
Anion gap: 11 (ref 5–15)
BUN: 31 mg/dL — ABNORMAL HIGH (ref 8–23)
CO2: 23 mmol/L (ref 22–32)
Calcium: 10.2 mg/dL (ref 8.9–10.3)
Chloride: 105 mmol/L (ref 98–111)
Creatinine, Ser: 1.45 mg/dL — ABNORMAL HIGH (ref 0.61–1.24)
GFR, Estimated: 51 mL/min — ABNORMAL LOW (ref >60)
Glucose, Bld: 165 mg/dL — ABNORMAL HIGH (ref 70–99)
Phosphorus: 2.7 mg/dL (ref 2.5–4.6)
Potassium: 3.8 mmol/L (ref 3.5–5.1)
Sodium: 139 mmol/L (ref 135–145)

## 2021-07-25 LAB — URINE CULTURE: Culture: NO GROWTH

## 2021-07-25 LAB — GLUCOSE, CAPILLARY
Glucose-Capillary: 125 mg/dL — ABNORMAL HIGH (ref 70–99)
Glucose-Capillary: 145 mg/dL — ABNORMAL HIGH (ref 70–99)
Glucose-Capillary: 140 mg/dL — ABNORMAL HIGH (ref 70–99)
Glucose-Capillary: 162 mg/dL — ABNORMAL HIGH (ref 70–99)
Glucose-Capillary: 153 mg/dL — ABNORMAL HIGH (ref 70–99)
Glucose-Capillary: 166 mg/dL — ABNORMAL HIGH (ref 70–99)

## 2021-07-25 LAB — CBC
HCT: 31.4 % — ABNORMAL LOW (ref 39.0–52.0)
Hemoglobin: 10 g/dL — ABNORMAL LOW (ref 13.0–17.0)
MCH: 29.2 pg (ref 26.0–34.0)
MCHC: 31.8 g/dL (ref 30.0–36.0)
MCV: 91.5 fL (ref 80.0–100.0)
Platelets: 165 10*3/uL (ref 150–400)
RBC: 3.43 MIL/uL — ABNORMAL LOW (ref 4.22–5.81)
RDW: 16.2 % — ABNORMAL HIGH (ref 11.5–15.5)
WBC: 13.3 10*3/uL — ABNORMAL HIGH (ref 4.0–10.5)
nRBC: 0 % (ref 0.0–0.2)

## 2021-07-25 LAB — MAGNESIUM: Magnesium: 1.8 mg/dL (ref 1.7–2.4)

## 2021-07-25 LAB — TRIGLYCERIDES: Triglycerides: 89 mg/dL (ref <150)

## 2021-07-25 MED ORDER — CHLORHEXIDINE GLUCONATE CLOTH 2 % EX PADS
6.0000 | MEDICATED_PAD | Freq: Every day | CUTANEOUS | Status: AC
  Administered 2021-07-25 – 2021-07-26 (×2): 6 via TOPICAL

## 2021-07-25 MED ORDER — POTASSIUM CHLORIDE 20 MEQ PO PACK
40.0000 meq | PACK | Freq: Once | ORAL | Status: AC
  Administered 2021-07-25: 40 meq
  Filled 2021-07-25: qty 2

## 2021-07-25 MED ORDER — AMANTADINE HCL 50 MG/5ML PO SOLN
100.0000 mg | Freq: Two times a day (BID) | ORAL | Status: DC
  Administered 2021-07-25 – 2021-08-02 (×17): 100 mg
  Filled 2021-07-25 (×19): qty 10

## 2021-07-25 MED ORDER — MUPIROCIN 2 % EX OINT
1.0000 "application " | TOPICAL_OINTMENT | Freq: Two times a day (BID) | CUTANEOUS | Status: AC
  Administered 2021-07-25 – 2021-07-29 (×10): 1 via NASAL
  Filled 2021-07-25 (×3): qty 22

## 2021-07-25 NOTE — Progress Notes (Signed)
NAME:  Benjamin Barnett, MRN:  [id number], DOB:  1946/10/20, LOS: 2 ADMISSION DATE:  07/23/2021, CONSULTATION DATE:  07/24/2021 REFERRING MD:  Dr. Ella Jubilee, CHIEF COMPLAINT:  Neck cellulitis with parotiditis   History of Present Illness:  Benjamin Barnett is a 75 y.o. male with a PMH significant for prior PEA arrest, retroperitoneal hematoma,  HTN, GERD, and prior MRSA pneumonia that presented to the ED from [facility] for complaints of AMS. Patient was recently discharged to SNF after prolonged hospital stay after suffering a PEA arrest and retroperitoneal hematoma.  Patient was only at SNF for approx 1 hr before EMS was called.    On ED arrival patient meet criteria for sepsis and was started on broad spectrum antibiotics for concern of acute parotitis and admitted per [facility] team to progressive care. Post PEA arrest patient was made a DNR but on ED arrival patient's daughter reversed code status to full.   PCCM called when patient was transferred from ED to progressive care due to acute concern for ability to protect airway. On presentation patient is seen very somnolent with bilateral rhonchi and severely swollen right face and neck with erythema and induration. Urgent transfer to ICU with ENT consult   Pertinent  Medical History  PEA arrest Retroperitoneal hematoma HTN  GERD  MRSA pneumonia  Significant Hospital Events:  8/12 admitted for sepsis secondary to parotitis and facial cellulitis  8/12 intubated for airway compromise and stridor  Interim History / Subjective:  Overnight no acute issues. Deeply sedated.   Objective   Blood pressure (!) 111/47, pulse 61, temperature 98 F (36.7 C), temperature source Oral, resp. rate (!) 0, height 5\' 10"  (1.778 m), weight 91.1 kg, SpO2 99 %.    Vent Mode: PRVC FiO2 (%):  [80 %-100 %] 80 % Set Rate:  [20 bmp] 20 bmp Vt Set:  [580 mL] 580 mL PEEP:  [5 cmH20] 5 cmH20 Plateau Pressure:  [14 cmH20] 14 cmH20   Intake/Output  Summary (Last 24 hours) at 07/25/2021 0738 Last data filed at 07/25/2021 0700 Gross per 24 hour  Intake 2391.17 ml  Output 1650 ml  Net 741.17 ml   Filed Weights   07/23/21 2059 07/24/21 1906  Weight: 96.6 kg 91.1 kg    Examination: General: acutely ill appearing, intubated sedated HEENT: ETT to vent. Anterior facial and neck swelling with erythema. Has not extended beyond marked line.  Neuro: deeply sedated, not responsive CV: s1s2 regular rate and rhythm, no murmur, rubs, or gallops,  PULM:  nonlabored, mechanical ventilation sounds auscultated GI:  soft, nt  Labs reviewed show CBG 145 WBC 13 Hgb 10 BMP pending  Resolved Hospital Problem list   Severe sepsis on admission  Assessment & Plan:  Acute hypoxemic respiratory failure secondary to facial swelling From severe parotitis and facial cellulitis  P: Blood cultures ngtd. MRSA PCR positive.  Continue IV Unasyn and Vancomycin  Not appropriate for extubation given significant facial swelling, will reassess with daily exams as well as cuff leak.  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  Deconditioning At risk protein malnutrition  P: PT/OT efforts as able  Tube feed  Protein supplementation  Hx of DVT and retroperitoneal bleed  P: Subq heparin      Best Practice   Diet/type: tubefeeds DVT prophylaxis: LMWH GI prophylaxis: PPI Lines: N/A Foley:  N/A Code Status:  full code Last date of multidisciplinary goals of care discussion: Confirmed with  Daughter  Marchelle Folks that patient is a FULL code and that family would want all aggressive measure include intubation. Will update again today.   Labs   CBC: Recent Labs  Lab 07/22/21 1156 07/23/21 1933 07/23/21 2026 07/24/21 0500 07/24/21 2254 07/25/21 0548  WBC 6.0 16.0*  --  17.1*  --  13.3*  NEUTROABS  --  13.4*  --  14.3*  --   --   HGB 12.1* 13.9 13.3 12.4* 9.9* 10.0*  HCT 38.8* 44.8 39.0 40.6 29.0* 31.4*  MCV 91.9 93.9  --  94.6  --  91.5  PLT 199 269  --  214  --  165    Basic Metabolic Panel: Recent Labs  Lab 07/20/21 0349 07/22/21 1156 07/23/21 0517 07/23/21 1933 07/23/21 2026 07/24/21 0500 07/24/21 2254  NA 138 143 145 144 146* 142 144  K 3.5 3.1* 3.2* 3.7 4.0 3.3* 3.4*  CL 102 105 104 103 106 103  --   CO2 25 33* 34* 33*  --  27  --   GLUCOSE 87 120* 121* 147* 141* 123*  --   BUN 33* 24* 26* 27* 35* 24*  --   CREATININE 1.29* 1.21 1.34* 1.46* 1.30* 1.33*  --   CALCIUM 10.9* 11.4* 11.3* 11.7*  --  11.0*  --   MG  --   --   --   --   --  1.7  --    GFR: Estimated Creatinine Clearance: 57 mL/min (A) (by C-G formula based on SCr of 1.33 mg/dL (H)). Recent Labs  Lab 07/22/21 1156 07/23/21 1933 07/23/21 2110 07/24/21 0500 07/25/21 0548  PROCALCITON  --   --   --  0.48  --   WBC 6.0 16.0*  --  17.1* 13.3*  LATICACIDVEN  --  2.6* 2.2*  --   --     Liver Function Tests: Recent Labs  Lab 07/23/21 1933 07/24/21 0500  AST 26 21  ALT 17 17  ALKPHOS 70 66  BILITOT 1.0 1.1  PROT 8.5* 7.4  ALBUMIN 3.5 2.9*   No results for input(s): LIPASE, AMYLASE in the last 168 hours. No results for input(s): AMMONIA in the last 168 hours.  ABG    Component Value Date/Time   PHART 7.518 (H) 07/24/2021 2254   PCO2ART 32.6 07/24/2021 2254   PO2ART 67 (L) 07/24/2021 2254   HCO3 26.4 07/24/2021 2254   TCO2 27 07/24/2021 2254   O2SAT 95.0 07/24/2021 2254     Coagulation Profile: Recent Labs  Lab 07/19/21 0318 07/20/21 0349 07/21/21 0342 07/23/21 1933  07/24/21 0500  INR 1.0 1.0 1.1 1.2 1.2    Cardiac Enzymes: No results for input(s): CKTOTAL, CKMB, CKMBINDEX, TROPONINI in the last 168 hours.  HbA1C: No results found for: HGBA1C  CBG: Recent Labs  Lab 07/24/21 1911 07/24/21 2308 07/25/21 0308 07/25/21 0720  GLUCAP 114* 106* 125* 145*   Critical care time:    The patient is critically ill due to respiratory failure, encephalopathy.  Critical care was necessary to treat or prevent imminent or life-threatening deterioration.  Critical care was time spent personally by me on the following activities:  development of treatment plan with patient and/or surrogate as well as nursing, discussions with consultants, evaluation of patient's response to treatment, examination of patient, obtaining history from patient or surrogate, ordering and performing treatments and interventions, ordering and review of laboratory studies, ordering and review of radiographic studies, pulse oximetry, re-evaluation of patient's condition and participation in multidisciplinary rounds.   Critical Care Time devoted to patient care services described in this note is 32 minutes. This time reflects time of care of this signee Charlott Holler . This critical care time does not reflect separately billable procedures or procedure time, teaching time or supervisory time of PA/NP/Med student/Med Resident etc but could involve care discussion time.       Charlott Holler [facility] 07/25/2021 7:38 AM  Pager: see AMION  If no response to pager , please call critical care on call (see AMION) until 7pm After 7:00 pm call Elink

## 2021-07-26 ENCOUNTER — Other Ambulatory Visit: Payer: Self-pay

## 2021-07-26 DIAGNOSIS — I10 Essential (primary) hypertension: Secondary | ICD-10-CM

## 2021-07-26 DIAGNOSIS — K1121 Acute sialoadenitis: Secondary | ICD-10-CM | POA: Diagnosis not present

## 2021-07-26 DIAGNOSIS — J9601 Acute respiratory failure with hypoxia: Secondary | ICD-10-CM | POA: Diagnosis not present

## 2021-07-26 DIAGNOSIS — G9341 Metabolic encephalopathy: Secondary | ICD-10-CM | POA: Diagnosis not present

## 2021-07-26 DIAGNOSIS — L0390 Cellulitis, unspecified: Secondary | ICD-10-CM

## 2021-07-26 DIAGNOSIS — B9562 Methicillin resistant Staphylococcus aureus infection as the cause of diseases classified elsewhere: Secondary | ICD-10-CM

## 2021-07-26 LAB — CBC
HCT: 29 % — ABNORMAL LOW (ref 39.0–52.0)
Hemoglobin: 9 g/dL — ABNORMAL LOW (ref 13.0–17.0)
MCH: 28.6 pg (ref 26.0–34.0)
MCHC: 31 g/dL (ref 30.0–36.0)
MCV: 92.1 fL (ref 80.0–100.0)
Platelets: 146 10*3/uL — ABNORMAL LOW (ref 150–400)
RBC: 3.15 MIL/uL — ABNORMAL LOW (ref 4.22–5.81)
RDW: 16.1 % — ABNORMAL HIGH (ref 11.5–15.5)
WBC: 10.2 10*3/uL (ref 4.0–10.5)
nRBC: 0 % (ref 0.0–0.2)

## 2021-07-26 LAB — RENAL FUNCTION PANEL
Albumin: 2.1 g/dL — ABNORMAL LOW (ref 3.5–5.0)
Anion gap: 11 (ref 5–15)
BUN: 44 mg/dL — ABNORMAL HIGH (ref 8–23)
CO2: 21 mmol/L — ABNORMAL LOW (ref 22–32)
Calcium: 9.6 mg/dL (ref 8.9–10.3)
Chloride: 107 mmol/L (ref 98–111)
Creatinine, Ser: 1.57 mg/dL — ABNORMAL HIGH (ref 0.61–1.24)
GFR, Estimated: 47 mL/min — ABNORMAL LOW (ref >60)
Glucose, Bld: 156 mg/dL — ABNORMAL HIGH (ref 70–99)
Phosphorus: 2.8 mg/dL (ref 2.5–4.6)
Potassium: 3.8 mmol/L (ref 3.5–5.1)
Sodium: 139 mmol/L (ref 135–145)

## 2021-07-26 LAB — GLUCOSE, CAPILLARY
Glucose-Capillary: 125 mg/dL — ABNORMAL HIGH (ref 70–99)
Glucose-Capillary: 140 mg/dL — ABNORMAL HIGH (ref 70–99)
Glucose-Capillary: 130 mg/dL — ABNORMAL HIGH (ref 70–99)
Glucose-Capillary: 143 mg/dL — ABNORMAL HIGH (ref 70–99)
Glucose-Capillary: 162 mg/dL — ABNORMAL HIGH (ref 70–99)
Glucose-Capillary: 169 mg/dL — ABNORMAL HIGH (ref 70–99)

## 2021-07-26 MED ORDER — LINEZOLID 600 MG/300ML IV SOLN
600.0000 mg | Freq: Two times a day (BID) | INTRAVENOUS | Status: DC
  Administered 2021-07-26 – 2021-07-27 (×4): 600 mg via INTRAVENOUS
  Filled 2021-07-26 (×5): qty 300

## 2021-07-26 MED ORDER — DEXMEDETOMIDINE HCL IN NACL 400 MCG/100ML IV SOLN
0.4000 ug/kg/h | INTRAVENOUS | Status: DC
Start: 2021-07-26 — End: 2021-07-26
  Administered 2021-07-26: 0.4 ug/kg/h via INTRAVENOUS
  Administered 2021-07-26: 1.1 ug/kg/h via INTRAVENOUS
  Filled 2021-07-26 (×2): qty 100

## 2021-07-26 MED ORDER — PROPOFOL 1000 MG/100ML IV EMUL
5.0000 ug/kg/min | INTRAVENOUS | Status: DC
Start: 2021-07-26 — End: 2021-07-28
  Administered 2021-07-26: 20 ug/kg/min via INTRAVENOUS
  Administered 2021-07-26: 10 ug/kg/min via INTRAVENOUS
  Administered 2021-07-27: 20 ug/kg/min via INTRAVENOUS
  Administered 2021-07-27: 5 ug/kg/min via INTRAVENOUS
  Filled 2021-07-26 (×4): qty 100

## 2021-07-26 NOTE — Progress Notes (Signed)
ENT follow-up Sedated, on ventilator.  Neck swelling, erythema and edema has significantly improved.  Continue with IV antibiotic therapy.  Extubate per critical care medicine.  Contact if additional advice needed.

## 2021-07-26 NOTE — Progress Notes (Signed)
Rt note- Patient placed back to full support due agitation and biting ETT, RN aware.

## 2021-07-26 NOTE — Progress Notes (Signed)
NAME:  Benjamin Barnett, MRN:  [id number], DOB:  March 25, 1946, LOS: 3 ADMISSION DATE:  07/23/2021, CONSULTATION DATE:  07/24/2021 REFERRING MD:  Dr. Ella Jubilee, CHIEF COMPLAINT:  Neck cellulitis with parotiditis   History of Present Illness:  Benjamin Barnett is a 75 y.o. male with a PMH significant for prior PEA arrest, retroperitoneal hematoma,  HTN, GERD, and prior MRSA pneumonia that presented to the ED from [facility] for complaints of AMS. Patient was recently discharged to SNF after prolonged hospital stay after suffering a PEA arrest and retroperitoneal hematoma.  Patient was only at SNF for approx 1 hr before EMS was called.    On ED arrival patient meet criteria for sepsis and was started on broad spectrum antibiotics for concern of acute parotitis and admitted per [facility] team to progressive care. Post PEA arrest patient was made a DNR but on ED arrival patient's daughter reversed code status to full.   PCCM called when patient was transferred from ED to progressive care due to acute concern for ability to protect airway. On presentation patient is seen very somnolent with bilateral rhonchi and severely swollen right face and neck with erythema and induration. Urgent transfer to ICU with ENT consult   Pertinent  Medical History  PEA arrest Retroperitoneal hematoma HTN  GERD  MRSA pneumonia  Significant Hospital Events:  8/12 admitted for sepsis secondary to parotitis and facial cellulitis  8/12 intubated for airway compromise and stridor 8/14 improved erythema  Interim History / Subjective:  No overnight issues. +cuff leak.   Objective   Blood pressure (!) 176/164, pulse 97, temperature 98.2 F (36.8 C), temperature source Axillary, resp. rate (!) 21, height 5\' 10"  (1.778 m), weight 91.1 kg, SpO2 98 %.    Vent Mode: PRVC FiO2 (%):  [40 %] 40 % Set Rate:  [20 bmp] 20 bmp Vt Set:  [580 mL] 580 mL PEEP:  [5 cmH20] 5 cmH20 Pressure Support:  [5 cmH20] 5  cmH20 Plateau Pressure:  [13 cmH20] 13 cmH20   Intake/Output Summary (Last 24 hours) at 07/26/2021 0933 Last data filed at 07/26/2021 0924 Gross per 24 hour  Intake 2021.46 ml  Output 675 ml  Net 1346.46 ml   Filed Weights   07/23/21 2059 07/24/21 1906  Weight: 96.6 kg 91.1 kg    Examination: General: acutely ill appearing, intubated sedated HEENT: ETT to vent +cuff leak. Substantially improved erythema. Neuro: awake, moves purposefully all 4 extremities, does not follow commands CV: s1s2 regular rate and rhythm, no murmur, rubs, or gallops,  PULM:  nonlabored, mechanical ventilation sounds auscultated GI:  soft, nt  Na 139 Cr 1.57   Resolved Hospital Problem list   Severe sepsis on admission Hypokalemia  Assessment & Plan:  Acute hypoxemic respiratory failure secondary to facial swelling From severe parotitis and facial cellulitis  P: Blood cultures ngtd. MRSA PCR positive.  Continue IV Unasyn and will switch to linezolid given AKI. Erythema improving. Plan for 7-10 day course of abx for cellulitis/parotitis.  Lung protective ventilation VAP bundle Consider extubation today or tomorrow depending how he does with SBT  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 P: Possible from sepsis Maintain neuro protective measures; goal for eurothermia, euglycemia, eunatermia, normoxia, and PCO2 goal of 35-40 Nutrition and bowel regimen Seizure precautions  Aspirations precautions  Minimize sedation and daily SAT  AKI Likely secondary to critical illness, pre-renal ATN. Uop adequate Follow bmp  Hypertension -Home medications include Norvasc, lopressor P: PRN IV hydralazine  Continuous telemetry  Deconditioning At risk protein malnutrition  P: PT/OT efforts as able  Tube feed  Protein supplementation  Hx of DVT and retroperitoneal bleed  P: Subq heparin      Best Practice   Diet/type: tubefeeds DVT prophylaxis: LMWH GI prophylaxis: PPI Lines: N/A Foley:   N/A Code Status:  full code Last date of multidisciplinary goals of care discussion: called Marchelle Folks patient's daughter on 8/13 and will update again today.   Labs   CBC: Recent Labs  Lab 07/22/21 1156 07/23/21 1933 07/23/21 2026 07/24/21 0500 07/24/21 2254 07/25/21 0548 07/26/21 0111  WBC 6.0 16.0*  --  17.1*  --  13.3* 10.2  NEUTROABS  --  13.4*  --  14.3*  --   --   --   HGB 12.1* 13.9 13.3 12.4* 9.9* 10.0* 9.0*  HCT 38.8* 44.8 39.0 40.6 29.0* 31.4* 29.0*  MCV 91.9 93.9  --  94.6  --  91.5 92.1  PLT 199 269  --  214  --  165 146*    Basic Metabolic Panel: Recent Labs  Lab 07/23/21 0517 07/23/21 1933 07/23/21 2026 07/24/21 0500 07/24/21 2254 07/25/21 0548 07/26/21 0111  NA 145 144 146* 142 144 139 139  K 3.2* 3.7 4.0 3.3* 3.4* 3.8 3.8  CL 104 103 106 103  --  105 107  CO2 34* 33*  --  27  --  23 21*  GLUCOSE 121* 147* 141* 123*  --  165* 156*  BUN 26* 27* 35* 24*  --  31* 44*  CREATININE 1.34* 1.46* 1.30* 1.33*  --  1.45* 1.57*  CALCIUM 11.3* 11.7*  --  11.0*  --  10.2 9.6  MG  --   --   --  1.7  --  1.8  --   PHOS  --   --   --   --   --  2.7 2.8   GFR: Estimated Creatinine Clearance: 48.2 mL/min (A) (by C-G formula based on SCr of 1.57 mg/dL (H)). Recent Labs  Lab 07/23/21 1933 07/23/21 2110 07/24/21 0500 07/25/21 0548 07/26/21 0111  PROCALCITON  --   --  0.48  --   --   WBC 16.0*  --  17.1* 13.3* 10.2  LATICACIDVEN 2.6* 2.2*  --   --   --     Liver Function Tests: Recent Labs  Lab 07/23/21 1933 07/24/21 0500 07/25/21 0548 07/26/21 0111  AST 26 21  --   --   ALT 17 17  --   --   ALKPHOS 70 66  --   --   BILITOT 1.0 1.1  --   --   PROT 8.5* 7.4  --   --   ALBUMIN 3.5 2.9* 2.2* 2.1*   No results for input(s): LIPASE, AMYLASE in the last 168 hours. No results for input(s): AMMONIA in the last 168 hours.  ABG    Component Value Date/Time   PHART 7.518 (H) 07/24/2021 2254   PCO2ART 32.6 07/24/2021 2254   PO2ART 67 (L) 07/24/2021 2254    HCO3 26.4 07/24/2021 2254   TCO2 27 07/24/2021 2254   O2SAT 95.0 07/24/2021 2254     Coagulation Profile: Recent Labs  Lab 07/20/21 0349 07/21/21 0342 07/23/21 1933 07/24/21 0500  INR 1.0 1.1 1.2 1.2    Cardiac Enzymes: No results for input(s): CKTOTAL, CKMB, CKMBINDEX, TROPONINI in the last 168 hours.  HbA1C: No results found for: HGBA1C  CBG: Recent Labs  Lab 07/25/21 1517 07/25/21 1943 07/25/21 2312  07/26/21 0317 07/26/21 0712  GLUCAP 162* 153* 166* 125* 140*   Critical care time:    The patient is critically ill due to respiratory failure, encephalopathy.  Critical care was necessary to treat or prevent imminent or life-threatening deterioration.  Critical care was time spent personally by me on the following activities: development of treatment plan with patient and/or surrogate as well as nursing, discussions with consultants, evaluation of patient's response to treatment, examination of patient, obtaining history from patient or surrogate, ordering and performing treatments and interventions, ordering and review of laboratory studies, ordering and review of radiographic studies, pulse oximetry, re-evaluation of patient's condition and participation in multidisciplinary rounds.   Critical Care Time devoted to patient care services described in this note is 36 minutes. This time reflects time of care of this signee Charlott Holler . This critical care time does not reflect separately billable procedures or procedure time, teaching time or supervisory time of PA/NP/Med student/Med Resident etc but could involve care discussion time.       Charlott Holler [facility] 07/26/2021 9:33 AM  Pager: see AMION  If no response to pager , please call critical care on call (see AMION) until 7pm After 7:00 pm call Elink

## 2021-07-27 DIAGNOSIS — G9341 Metabolic encephalopathy: Secondary | ICD-10-CM | POA: Diagnosis not present

## 2021-07-27 DIAGNOSIS — K1121 Acute sialoadenitis: Secondary | ICD-10-CM | POA: Diagnosis not present

## 2021-07-27 DIAGNOSIS — J9601 Acute respiratory failure with hypoxia: Secondary | ICD-10-CM | POA: Diagnosis not present

## 2021-07-27 DIAGNOSIS — A419 Sepsis, unspecified organism: Secondary | ICD-10-CM | POA: Diagnosis not present

## 2021-07-27 DIAGNOSIS — L8990 Pressure ulcer of unspecified site, unspecified stage: Secondary | ICD-10-CM | POA: Insufficient documentation

## 2021-07-27 LAB — CBC
HCT: 31 % — ABNORMAL LOW (ref 39.0–52.0)
Hemoglobin: 9.7 g/dL — ABNORMAL LOW (ref 13.0–17.0)
MCH: 28.9 pg (ref 26.0–34.0)
MCHC: 31.3 g/dL (ref 30.0–36.0)
MCV: 92.3 fL (ref 80.0–100.0)
Platelets: 163 10*3/uL (ref 150–400)
RBC: 3.36 MIL/uL — ABNORMAL LOW (ref 4.22–5.81)
RDW: 15.4 % (ref 11.5–15.5)
WBC: 9.7 10*3/uL (ref 4.0–10.5)
nRBC: 0 % (ref 0.0–0.2)

## 2021-07-27 LAB — TRIGLYCERIDES: Triglycerides: 136 mg/dL (ref <150)

## 2021-07-27 LAB — RENAL FUNCTION PANEL
Albumin: 2.4 g/dL — ABNORMAL LOW (ref 3.5–5.0)
Anion gap: 6 (ref 5–15)
BUN: 44 mg/dL — ABNORMAL HIGH (ref 8–23)
CO2: 26 mmol/L (ref 22–32)
Calcium: 9.2 mg/dL (ref 8.9–10.3)
Chloride: 107 mmol/L (ref 98–111)
Creatinine, Ser: 1.31 mg/dL — ABNORMAL HIGH (ref 0.61–1.24)
GFR, Estimated: 58 mL/min — ABNORMAL LOW (ref >60)
Glucose, Bld: 101 mg/dL — ABNORMAL HIGH (ref 70–99)
Phosphorus: 3 mg/dL (ref 2.5–4.6)
Potassium: 3.8 mmol/L (ref 3.5–5.1)
Sodium: 139 mmol/L (ref 135–145)

## 2021-07-27 LAB — GLUCOSE, CAPILLARY
Glucose-Capillary: 99 mg/dL (ref 70–99)
Glucose-Capillary: 92 mg/dL (ref 70–99)
Glucose-Capillary: 112 mg/dL — ABNORMAL HIGH (ref 70–99)
Glucose-Capillary: 115 mg/dL — ABNORMAL HIGH (ref 70–99)
Glucose-Capillary: 105 mg/dL — ABNORMAL HIGH (ref 70–99)
Glucose-Capillary: 113 mg/dL — ABNORMAL HIGH (ref 70–99)

## 2021-07-27 MED ORDER — PROSOURCE TF PO LIQD
90.0000 mL | Freq: Two times a day (BID) | ORAL | Status: DC
  Administered 2021-07-27 – 2021-08-02 (×12): 90 mL
  Filled 2021-07-27 (×13): qty 90

## 2021-07-27 MED ORDER — DEXAMETHASONE SODIUM PHOSPHATE 4 MG/ML IJ SOLN
4.0000 mg | INTRAMUSCULAR | Status: AC
Start: 2021-07-27 — End: 2021-07-27
  Administered 2021-07-27: 4 mg via INTRAVENOUS
  Filled 2021-07-27: qty 1

## 2021-07-27 MED ORDER — NEPRO/CARBSTEADY PO LIQD
1000.0000 mL | ORAL | Status: DC
  Administered 2021-07-27 – 2021-08-01 (×6): 1000 mL
  Filled 2021-07-27 (×9): qty 1000

## 2021-07-27 NOTE — Progress Notes (Signed)
   07/27/21 1115  Clinical Encounter Type  Visited With Patient  Visit Type Spiritual support  Referral From Nurse  Consult/Referral To Chaplain   Chaplain responded to the consult request for prayer. The patient appeared not to be able to respond. The chaplain informed the patient that I would like to pray with him if it were ok. The patient made an audible noise. The chaplain prayed at the bedside. This note was prepared by Deneen Harts, M.Div..  For questions please contact by phone [phone].

## 2021-07-27 NOTE — Consult Note (Addendum)
WOC Nurse Consult Note: Patient receiving care in [facility] 2M07 Patient is intubated and agitated when turning Reason for Consult: Mult wounds Wound type: MASD/IAD to the intergluteal cleft and groin area around the scrotum Abrasion on the RLE just above the malleolus that is crusted/red/pink and is approximately 2.5 cm x 2 cm Pressure Injury POA: NA Dressing procedure/placement/frequency: Clean the sacral area with no rinse cleanser, pat dry and apply triple paste to the areas of irritation twice daily or PRN soiling. Apply a thin layer of Mupirocin ointment (Bactroban) to the abrasion on the RLE and cover with foam dressing. Apply daily.  ICD-10 CM Codes for Irritant Dermatitis L24A2 - Due to fecal, urinary or dual incontinence  Monitor the wound area(s) for worsening of condition such as: Signs/symptoms of infection, increase in size, development of or worsening of odor, development of pain, or increased pain at the affected locations.   Notify the medical team if any of these develop.  Pressure Injury Prevention Bundle Support surfaces (air mattress) chair cushion [place] # [phone]) Heel offloading boots [place] # [phone]) Turning and Positioning  Measures to reduce shear (draw sheet, knees up) Skin protection Products (Foam dressing) Moisture management products (Critic-Aid Barrier Cream (Purple top) Nutrition Management Protection for Medical Devices Routine Skin Assessment   Thank you for the consult. WOC nurse will not follow at this time.   Please re-consult the WOC team if needed.  Renaldo Reel Katrinka Blazing, MSN, RN, CMSRN, Angus Seller, [facility] Wound Treatment Associate Pager [phone]

## 2021-07-27 NOTE — Progress Notes (Addendum)
NAME:  Benjamin Barnett, MRN:  [id number], DOB:  04/16/1946, LOS: 4 ADMISSION DATE:  07/23/2021, CONSULTATION DATE:  07/24/2021 REFERRING MD:  Dr. Ella Jubilee, CHIEF COMPLAINT:  Neck cellulitis with parotiditis   History of Present Illness:  Benjamin Barnett is a 75 y.o. male with a PMH significant for prior PEA arrest, retroperitoneal hematoma,  HTN, GERD, and prior MRSA pneumonia that presented to the ED from [facility] for complaints of AMS. Patient was recently discharged to SNF after prolonged hospital stay after suffering a PEA arrest and retroperitoneal hematoma. Patient was only at SNF for approx 1 hr before EMS was called.    On ED arrival patient meet criteria for sepsis and was started on broad spectrum antibiotics for concern of acute parotitis and admitted per [facility] team to progressive care. Post PEA arrest patient was made a DNR but on ED arrival patient's daughter reversed code status to full.   PCCM called when patient was transferred from ED to progressive care due to acute concern for ability to protect airway. On presentation patient is seen very somnolent with bilateral rhonchi and severely swollen right face and neck with erythema and induration. Urgent transfer to ICU with ENT consult   Pertinent  Medical History  PEA arrest Retroperitoneal hematoma HTN  GERD  MRSA pneumonia  Significant Hospital Events:  8/12 admitted for sepsis secondary to parotitis and facial cellulitis  8/12 intubated for airway compromise and stridor 8/14 improved erythema 8/15 extubated  Interim History / Subjective:  Patient seen at bedside this AM, intubated and not following commands.   Objective   Blood pressure (!) 129/49, pulse 66, temperature 98.4 F (36.9 C), temperature source Axillary, resp. rate 18, height 5\' 10"  (1.778 m), weight 91.1 kg, SpO2 100 %.    Vent Mode: PRVC FiO2 (%):  [40 %] 40 % Set Rate:  [14 bmp-20 bmp] 14 bmp Vt Set:  [580 mL] 580 mL PEEP:  [5  cmH20] 5 cmH20 Pressure Support:  [5 cmH20] 5 cmH20 Plateau Pressure:  [13 cmH20-17 cmH20] 13 cmH20   Intake/Output Summary (Last 24 hours) at 07/27/2021 0754 Last data filed at 07/27/2021 0557 Gross per 24 hour  Intake 2795.19 ml  Output 1525 ml  Net 1270.19 ml   Filed Weights   07/23/21 2059 07/24/21 1906  Weight: 96.6 kg 91.1 kg   Examination: Physical Exam Constitutional:      Appearance: He is obese. He is ill-appearing.     Comments: Intubated, chronically ill appearing,   Cardiovascular:     Rate and Rhythm: Normal rate and regular rhythm.     Pulses: Normal pulses.     Heart sounds: Normal heart sounds. No murmur heard.   No friction rub. No gallop.  Pulmonary:     Effort: Pulmonary effort is normal.     Breath sounds: Normal breath sounds. No wheezing, rhonchi or rales.  Abdominal:     General: Abdomen is flat. Bowel sounds are normal. There is no distension.     Palpations: Abdomen is soft.     Tenderness: There is no abdominal tenderness.  Skin:    General: Skin is warm.     Coloration: Skin is not jaundiced.     Findings: No bruising, lesion or rash.     Comments: Erythema much improved from baseline.    Resolved Hospital Problem list   Severe sepsis on admission Hypokalemia  Assessment & Plan:  Acute hypoxemic respiratory failure secondary to facial swelling From severe  parotitis and facial cellulitis  P: Blood Cultures NGTD. MRSA PCR +, switched from vanc to linezolid 2/2 to renal fxn on 8/14. No leukocytosis since 8/13, no febrile events since 8/12.  - Continue IV Unasyn and Linezolid. Erythema continues to improve. 4/7 course of abx for cellulitis/parotitis.  - Tolerating PS with an RSBI of 18 indicating a successful extubation. Extubate today.  - Continue Decadron 6 mg stop date 8/16  Acute on Chronic Metabolic Encephalopathy:  - Maintain neuro protective measures; goal for eurothermia, euglycemia, eunatermia, normoxia, and PCO2 goal of 35-40. -  Nutrition and bowel regimen - Seizure precautions  - Aspirations precautions  - Minimize sedation and daily SAT, held fentanyl and propofol   AKI Likely 2/2 to critical illness, pre-renal ATN, improving.  - Trend BMP  Hypertension - Home medications include Norvasc, lopressor - IV hydralazine PRN - Tele  Deconditioning At risk protein malnutrition  P: - Continue TF  - PT/OT efforts as able  - Protein supplementation  Hx of DVT and retroperitoneal bleed  P: - Continue SQ heparin   Best Practice   Diet/type: tubefeeds DVT prophylaxis: LMWH GI prophylaxis: PPI Lines: N/A Foley:  N/A Code Status:  full code Last date of multidisciplinary goals of care discussion: called Marchelle Folks patient's daughter on 8/13 and will update again today.   Labs   CBC: Recent Labs  Lab 07/23/21 1933 07/23/21 2026 07/24/21 0500 07/24/21 2254 07/25/21 0548 07/26/21 0111 07/27/21 0625  WBC 16.0*  --  17.1*  --  13.3* 10.2 9.7  NEUTROABS 13.4*  --  14.3*  --   --   --   --   HGB 13.9   < > 12.4* 9.9* 10.0* 9.0* 9.7*  HCT 44.8   < > 40.6 29.0* 31.4* 29.0* 31.0*  MCV 93.9  --  94.6  --  91.5 92.1 92.3  PLT 269  --  214  --  165 146* 163   < > = values in this interval not displayed.    Basic Metabolic Panel: Recent Labs  Lab 07/23/21 1933 07/23/21 2026 07/24/21 0500 07/24/21 2254 07/25/21 0548 07/26/21 0111 07/27/21 0625  NA 144 146* 142 144 139 139 139  K 3.7 4.0 3.3* 3.4* 3.8 3.8 3.8  CL 103 106 103  --  105 107 107  CO2 33*  --  27  --  23 21* 26  GLUCOSE 147* 141* 123*  --  165* 156* 101*  BUN 27* 35* 24*  --  31* 44* 44*  CREATININE 1.46* 1.30* 1.33*  --  1.45* 1.57* 1.31*  CALCIUM 11.7*  --  11.0*  --  10.2 9.6 9.2  MG  --   --  1.7  --  1.8  --   --   PHOS  --   --   --   --  2.7 2.8 3.0   GFR: Estimated Creatinine Clearance: 57.8 mL/min (A) (by C-G formula based on SCr of 1.31 mg/dL (H)). Recent Labs  Lab 07/23/21 1933 07/23/21 2110 07/24/21 0500 07/25/21 0548  07/26/21 0111 07/27/21 0625  PROCALCITON  --   --  0.48  --   --   --   WBC 16.0*  --  17.1* 13.3* 10.2 9.7  LATICACIDVEN 2.6* 2.2*  --   --   --   --     Liver Function Tests: Recent Labs  Lab 07/23/21 1933 07/24/21 0500 07/25/21 0548 07/26/21 0111 07/27/21 0625  AST 26 21  --   --   --  ALT 17 17  --   --   --   ALKPHOS 70 66  --   --   --   BILITOT 1.0 1.1  --   --   --   PROT 8.5* 7.4  --   --   --   ALBUMIN 3.5 2.9* 2.2* 2.1* 2.4*   No results for input(s): LIPASE, AMYLASE in the last 168 hours. No results for input(s): AMMONIA in the last 168 hours.  ABG    Component Value Date/Time   PHART 7.518 (H) 07/24/2021 2254   PCO2ART 32.6 07/24/2021 2254   PO2ART 67 (L) 07/24/2021 2254   HCO3 26.4 07/24/2021 2254   TCO2 27 07/24/2021 2254   O2SAT 95.0 07/24/2021 2254     Coagulation Profile: Recent Labs  Lab 07/21/21 0342 07/23/21 1933 07/24/21 0500  INR 1.1 1.2 1.2    Cardiac Enzymes: No results for input(s): CKTOTAL, CKMB, CKMBINDEX, TROPONINI in the last 168 hours.  HbA1C: No results found for: HGBA1C  CBG: Recent Labs  Lab 07/26/21 1455 07/26/21 2005 07/26/21 2300 07/27/21 0411 07/27/21 0722  GLUCAP 143* 162* 169* 99 92   Dolan Amen, MD IMTS, PGY-3 Pager: [phone] 07/27/2021,7:54 AM   If no response to pager , please call critical care on call (see AMION) until 7pm After 7:00 pm call Elink

## 2021-07-27 NOTE — Progress Notes (Signed)
Initial Nutrition Assessment  DOCUMENTATION CODES:   Not applicable  INTERVENTION:   -Increase Nepro @ 50 ml/hr via PEG (1200 ml daily)  90 ml Prosource TF BID.  45 ml free water flush every 2 hours    Tube feeding regimen provides 2240 kcal (99% of needs), 119 grams of protein, and 872 ml of H2O. Total free water: 1412 ml daily   NUTRITION DIAGNOSIS:   Inadequate oral intake related to inability to eat as evidenced by NPO status.  GOAL:   Patient will meet greater than or equal to 90% of their needs  MONITOR:   Labs, Weight trends, TF tolerance, Skin, I & O's  REASON FOR ASSESSMENT:   Ventilator    ASSESSMENT:   75 year old male with past medical history of hypertension, hyperlipidemia, gout and gastroesophageal reflux disease with PEA arrest in 05/2021 resulting in recent prolonged hospitalization including prolonged stay at an LTAC who is [facility] emergency department today after [facility] to [facility] health care skilled nurse facility and found to be extremely lethargic and minimally responsive.  Pt admitted with sepsis secondary to acute rt sided suppurative parotitis.   8/12- intubated 8/15- extubated  Reviewed I/O's: +1.4 L x 24 hours and +5.4 L since admission  UOP: 1.5 L x 24 hours  Case discussed with RN, who reports pt with pre-existing PEG. Case discussed with MD, who gave RD permission to start TF.    Pt alert and able to state his name. When asked when his birthday was, he responded "[birth year]". Reviewed MAR [facility]; home TF regimen is Nepro @ 40 ml/hr and 45 ml free water flush every 4 hours  Per [facility] notes, pt with MASD/IAD to intergluteal cleft and groin area around the scrotum and abrasion to RLE.   Medications reviewed and include colace, reglan, and miralax.  Labs reviewed: CBGS: 92-169 (inpatient orders for glycemic control are 0-9 units insulin aspart every 4 hours).     NUTRITION - FOCUSED PHYSICAL EXAM:  Flowsheet Row Most Recent Value  Orbital Region No depletion  Upper Arm Region No depletion  Thoracic and Lumbar Region No depletion  Buccal Region No depletion  Temple Region No depletion  Clavicle Bone Region No depletion  Clavicle and Acromion Bone Region No depletion  Scapular Bone Region No depletion  Dorsal Hand No depletion  Patellar Region Mild depletion  Anterior Thigh Region Mild depletion  Posterior Calf Region Mild depletion  Edema (RD Assessment) Mild  Hair Reviewed  Eyes Reviewed  Mouth Reviewed  Skin Reviewed  Nails Reviewed       Diet Order:   Diet Order             Diet NPO time specified  Diet effective now                   EDUCATION NEEDS:   Not appropriate for education at this time  Skin:  Skin Assessment: Skin Integrity Issues: Skin Integrity Issues:: Other (Comment) Other: skin tear lt arm, MASD/IAD to intergluteral cleft and groin area around scrotum, abrasion to RLE  Last BM:  07/27/21  Height:   Ht Readings from Last 1 Encounters:  07/23/21 5\' 10"  (1.778 m)    Weight:   Wt Readings from Last 1 Encounters:  07/24/21 91.1 kg    Ideal Body Weight:  75.5 kg  BMI:  Body mass index is 28.82 kg/m.  Estimated Nutritional Needs:   Kcal:  09/23/21  Protein:  115-130 grams  Fluid:  > 2 L    Loistine Chance, RD, LDN, [place] Registered Dietitian II Certified Diabetes Care and Education Specialist Please refer to [facility] for RD and/or RD on-call/weekend/after hours pager

## 2021-07-27 NOTE — Evaluation (Signed)
Clinical/Bedside Swallow Evaluation Patient Details  Name: Benjamin Barnett MRN: [id number] Date of Birth: 22-Apr-1946  Today's Date: 07/27/2021 Time: SLP Start Time (ACUTE ONLY): 1543 SLP Stop Time (ACUTE ONLY): 1555 SLP Time Calculation (min) (ACUTE ONLY): 12 min  Past Medical History:  Past Medical History:  Diagnosis Date   Cardiac arrest (HCC) 07/24/2021   DVT of axillary vein, acute right (HCC) 05/2021   Essential hypertension 07/24/2021   GERD without esophagitis 07/24/2021   Gout 07/24/2021   History of MRSA infection of lungs 07/24/2021   Mixed hyperlipidemia 07/24/2021   Retroperitoneal hematoma 2022   Past Surgical History:  Past Surgical History:  Procedure Laterality Date   IR GASTROSTOMY TUBE MOD SED  06/16/2021   HPI:  Pt is a 75 y.o. male who presented to the ED from [facility] with AMS. ETT 8/12-8/15 (0930). 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 CT soft tissue neck 8/12: Multispatial inflammation and edema in the right face and neck, moderately involving the right lateral pharyngeal wall and the supraglottic larynx. PMH: prior PEA arrest, retroperitoneal hematoma, PEG, HTN, GERD, and prior MRSA pneumonia. Patient was recently discharged to SNF after prolonged hospital stay after suffering a PEA arrest and retroperitoneal hematoma.  Patient was only at SNF for approx 1 hr before EMS was called.   Assessment / Plan / Recommendation Clinical Impression  Pt was seen for bedside swallow evaluation. He did not participate in conversation or follow any commands. A single utterance was produced during the evaluation, but intelligibility was reduced and vocal quality could not be assessed. A complete oral mechanism exam could not be conducted due to pt's difficulty following commands. Oral inspection could not be completed due to pt's refusal to open his mouth despite verbal/tactile cues. Pt pursed his lips tightly  when solids, ice chips, and liquids were presented. He accepted a single, small bolus of thin liquids via straw and no overt s/sx of aspiration were noted after oral holding. Pt's current swallow function is likely at least partly impacted by his mentation at this time. It is recommended that his NPO status be maintained with continued use of the G-tube. SLP will follow to assess improvement in swallow function. SLP Visit Diagnosis: Dysphagia, unspecified (R13.10)    Aspiration Risk  Mild aspiration risk;Moderate aspiration risk    Diet Recommendation NPO   Medication Administration: Via alternative means    Other  Recommendations Oral Care Recommendations: Oral care QID;Staff/trained caregiver to provide oral care   Follow up Recommendations  (TBD)      Frequency and Duration min 2x/week  2 weeks       Prognosis Prognosis for Safe Diet Advancement: Good      Swallow Study   General Date of Onset: 07/27/21 HPI: Pt is a 75 y.o. male who presented to the ED from [facility] with AMS. ETT 8/12-8/15 (0930). 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 CT soft tissue neck 8/12: Multispatial inflammation and edema in the right face and neck, moderately involving the right lateral pharyngeal wall and the supraglottic larynx. PMH: prior PEA arrest, retroperitoneal hematoma, PEG, HTN, GERD, and prior MRSA pneumonia. Patient was recently discharged to SNF after prolonged hospital stay after suffering a PEA arrest and retroperitoneal hematoma.  Patient was only at SNF for approx 1 hr before EMS was called. Type of Study: Bedside Swallow Evaluation Previous Swallow Assessment: none Diet Prior to this Study: NPO;PEG tube  Temperature Spikes Noted: No Respiratory Status: Room air History of Recent Intubation: Yes Length of Intubations (days): 3 days Date extubated: 07/27/21 Behavior/Cognition: Alert;Confused;Doesn't follow directions Oral  Cavity Assessment:  (UTA) Oral Care Completed by SLP: No (pt did not allow) Oral Cavity - Dentition:  (Difficult to assess, but some teeth noted) Self-Feeding Abilities: Total assist Patient Positioning: Upright in bed;Postural control adequate for testing Baseline Vocal Quality:  (limited verbal output) Volitional Cough: Cognitively unable to elicit Volitional Swallow: Unable to elicit    Oral/Motor/Sensory Function Overall Oral Motor/Sensory Function:  (UTA)   Ice Chips Ice chips:  (Pt refused)   Thin Liquid Thin Liquid: Impaired (single bolus) Presentation: Straw Oral Phase Impairments: Poor awareness of bolus Oral Phase Functional Implications: Oral holding    Nectar Thick     Honey Thick     Puree Puree: Not tested (Pt refused)   Solid     Solid: Not tested     Benjamin Barnett I. Benjamin Clock, MS, CCC-SLP Acute Rehabilitation Services Office number [phone] Pager [phone]  Benjamin Barnett 07/27/2021,4:05 PM

## 2021-07-27 NOTE — Procedures (Signed)
Extubation Procedure Note  Patient Details:   Name: Benjamin Barnett DOB: Jul 19, 1946 MRN: [id number]   Airway Documentation:    Vent end date: 07/27/21 Vent end time: 0930   Evaluation  O2 sats: stable throughout Complications: No apparent complications Patient did tolerate procedure well. Bilateral Breath Sounds: Rhonchi   No  Patient extubated per order to 4L [name] with no apparent complications. Positive cuff leak was noted prior to extubation. Patient is unable to follow commands or speak at this time. Vitals are stable. RT will continue to monitor.   Willia Genrich Lajuana Ripple 07/27/2021, 9:35 AM

## 2021-07-28 LAB — POCT I-STAT 7, (LYTES, BLD GAS, ICA,H+H)
Acid-Base Excess: 1 mmol/L (ref 0.0–2.0)
Bicarbonate: 24.3 mmol/L (ref 20.0–28.0)
Calcium, Ion: 1.29 mmol/L (ref 1.15–1.40)
HCT: 31 % — ABNORMAL LOW (ref 39.0–52.0)
Hemoglobin: 10.5 g/dL — ABNORMAL LOW (ref 13.0–17.0)
O2 Saturation: 94 %
Patient temperature: 97.9
Potassium: 3.3 mmol/L — ABNORMAL LOW (ref 3.5–5.1)
Sodium: 141 mmol/L (ref 135–145)
TCO2: 25 mmol/L (ref 22–32)
pCO2 arterial: 31.1 mmHg — ABNORMAL LOW (ref 32.0–48.0)
pH, Arterial: 7.498 — ABNORMAL HIGH (ref 7.350–7.450)
pO2, Arterial: 64 mmHg — ABNORMAL LOW (ref 83.0–108.0)

## 2021-07-28 LAB — CBC
HCT: 33.1 % — ABNORMAL LOW (ref 39.0–52.0)
Hemoglobin: 10.2 g/dL — ABNORMAL LOW (ref 13.0–17.0)
MCH: 28.7 pg (ref 26.0–34.0)
MCHC: 30.8 g/dL (ref 30.0–36.0)
MCV: 93 fL (ref 80.0–100.0)
Platelets: 153 10*3/uL (ref 150–400)
RBC: 3.56 MIL/uL — ABNORMAL LOW (ref 4.22–5.81)
RDW: 15.2 % (ref 11.5–15.5)
WBC: 11.6 10*3/uL — ABNORMAL HIGH (ref 4.0–10.5)
nRBC: 0.3 % — ABNORMAL HIGH (ref 0.0–0.2)

## 2021-07-28 LAB — CULTURE, BLOOD (ROUTINE X 2)
Culture: NO GROWTH
Culture: NO GROWTH

## 2021-07-28 LAB — RENAL FUNCTION PANEL
Albumin: 2.5 g/dL — ABNORMAL LOW (ref 3.5–5.0)
Anion gap: 9 (ref 5–15)
BUN: 37 mg/dL — ABNORMAL HIGH (ref 8–23)
CO2: 20 mmol/L — ABNORMAL LOW (ref 22–32)
Calcium: 8.9 mg/dL (ref 8.9–10.3)
Chloride: 109 mmol/L (ref 98–111)
Creatinine, Ser: 1.1 mg/dL (ref 0.61–1.24)
GFR, Estimated: >60 mL/min (ref >60)
Glucose, Bld: 105 mg/dL — ABNORMAL HIGH (ref 70–99)
Phosphorus: 1.1 mg/dL — ABNORMAL LOW (ref 2.5–4.6)
Potassium: 3.1 mmol/L — ABNORMAL LOW (ref 3.5–5.1)
Sodium: 138 mmol/L (ref 135–145)

## 2021-07-28 LAB — GLUCOSE, CAPILLARY
Glucose-Capillary: 88 mg/dL (ref 70–99)
Glucose-Capillary: 95 mg/dL (ref 70–99)
Glucose-Capillary: 89 mg/dL (ref 70–99)
Glucose-Capillary: 104 mg/dL — ABNORMAL HIGH (ref 70–99)
Glucose-Capillary: 106 mg/dL — ABNORMAL HIGH (ref 70–99)

## 2021-07-28 MED ORDER — SODIUM PHOSPHATES 45 MMOLE/15ML IV SOLN
45.0000 mmol | Freq: Once | INTRAVENOUS | Status: AC
  Administered 2021-07-28: 45 mmol via INTRAVENOUS
  Filled 2021-07-28: qty 15

## 2021-07-28 MED ORDER — QUETIAPINE FUMARATE 50 MG PO TABS
50.0000 mg | ORAL_TABLET | Freq: Every day | ORAL | Status: AC
  Administered 2021-07-28: 50 mg via ORAL
  Filled 2021-07-28: qty 1

## 2021-07-28 MED ORDER — LINEZOLID 600 MG/300ML IV SOLN: 600.0000 mg | Freq: Two times a day (BID) | INTRAVENOUS | Status: DC

## 2021-07-28 MED ORDER — CHLORHEXIDINE GLUCONATE 0.12 % MT SOLN
15.0000 mL | Freq: Two times a day (BID) | OROMUCOSAL | Status: DC
  Administered 2021-07-28 – 2021-08-02 (×10): 15 mL via OROMUCOSAL
  Filled 2021-07-28 (×9): qty 15

## 2021-07-28 MED ORDER — ORAL CARE MOUTH RINSE
15.0000 mL | Freq: Two times a day (BID) | OROMUCOSAL | Status: DC
  Administered 2021-07-29 – 2021-08-02 (×6): 15 mL via OROMUCOSAL

## 2021-07-28 MED ORDER — LINEZOLID 600 MG/300ML IV SOLN
600.0000 mg | Freq: Two times a day (BID) | INTRAVENOUS | Status: AC
  Administered 2021-07-28 – 2021-07-29 (×4): 600 mg via INTRAVENOUS
  Filled 2021-07-28 (×4): qty 300

## 2021-07-28 MED ORDER — QUETIAPINE FUMARATE 50 MG PO TABS
50.0000 mg | ORAL_TABLET | Freq: Every day | ORAL | Status: DC
  Administered 2021-07-28 – 2021-08-01 (×5): 50 mg
  Filled 2021-07-28 (×5): qty 1

## 2021-07-28 MED ORDER — FREE WATER
200.0000 mL | Freq: Three times a day (TID) | Status: DC
  Administered 2021-07-28 – 2021-08-02 (×15): 200 mL

## 2021-07-28 MED ORDER — SERTRALINE HCL 50 MG PO TABS
50.0000 mg | ORAL_TABLET | Freq: Every day | ORAL | Status: DC
  Administered 2021-07-28 – 2021-08-02 (×6): 50 mg
  Filled 2021-07-28 (×6): qty 1

## 2021-07-28 MED ORDER — POTASSIUM CHLORIDE 20 MEQ PO PACK
40.0000 meq | PACK | Freq: Once | ORAL | Status: AC
  Administered 2021-07-28: 40 meq
  Filled 2021-07-28: qty 2

## 2021-07-28 MED ORDER — POTASSIUM CHLORIDE 10 MEQ/100ML IV SOLN
10.0000 meq | INTRAVENOUS | Status: AC
  Administered 2021-07-28 (×4): 10 meq via INTRAVENOUS
  Filled 2021-07-28 (×5): qty 100

## 2021-07-28 MED ORDER — FREE WATER: 180.0000 mL | Freq: Once | Status: DC

## 2021-07-28 MED ORDER — MIRTAZAPINE 7.5 MG PO TABS
7.5000 mg | ORAL_TABLET | Freq: Every day | ORAL | Status: DC
  Administered 2021-07-28 – 2021-08-01 (×5): 7.5 mg
  Filled 2021-07-28 (×6): qty 1

## 2021-07-28 NOTE — Plan of Care (Signed)
  Problem: Clinical Measurements: Goal: Ability to maintain clinical measurements within normal limits will improve Outcome: Progressing   Problem: Clinical Measurements: Goal: Diagnostic test results will improve Outcome: Progressing   Problem: Clinical Measurements: Goal: Respiratory complications will improve Outcome: Progressing   Problem: Clinical Measurements: Goal: Cardiovascular complication will be avoided Outcome: Progressing   Problem: Nutrition: Goal: Adequate nutrition will be maintained Outcome: Progressing   Problem: Activity: Goal: Risk for activity intolerance will decrease Outcome: Progressing   Problem: Elimination: Goal: Will not experience complications related to bowel motility Outcome: Progressing   Problem: Pain Managment: Goal: General experience of comfort will improve Outcome: Progressing   Problem: Safety: Goal: Ability to remain free from injury will improve Outcome: Progressing   Problem: Skin Integrity: Goal: Risk for impaired skin integrity will decrease Outcome: Progressing

## 2021-07-28 NOTE — Progress Notes (Signed)
  Speech Language Pathology Treatment: Dysphagia  Patient Details Name: Benjamin Barnett MRN: [id number] DOB: 14-Mar-1946 Today's Date: 07/28/2021 Time: 0920-1000 SLP Time Calculation (min) (ACUTE ONLY): 40 min  Assessment / Plan / Recommendation Clinical Impression  Pt demonstrates improved attention and arousal today. He is still quite confused and needs verbal and visual cues to attend to therapist and PO. He is communicative, participating in social banter, following one step commands with repetition intermittently. His vocal quality is clear. He will not cough on command saying "I don't like doing that" but has a hard spontaneous cough. He masticates and swallows ice well. Takes straw sips of water with one delayed cough. Given prolonged NPO status (since PEA arrest) with no prior assessments, recent intubation and significant parotitis, recommend instrumental testing (FEES) prior to PO. RN may give bites of ice when alert to improve health of oral mucosa prior to FEES.    HPI HPI: Pt is a 75 y.o. male who presented to the ED from [facility] with AMS. ETT 8/12-8/15 (0930). 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 CT soft tissue neck 8/12: Multispatial inflammation and edema in the right face and neck, moderately involving the right lateral pharyngeal wall and the supraglottic larynx. ENT on 8/12 reports "There is no airway compromise by imaging or by clinical examination." PMH: prior PEA arrest with anoxic brain injury, retroperitoneal hematoma, PEG, HTN, GERD, and prior MRSA pneumonia. Patient was recently discharged to SNF after prolonged hospital stay after suffering a PEA arrest and retroperitoneal hematoma.  Patient was only at SNF for approx 1 hr before EMS was called. Per daughter pts swallowing has never been assessed since his arrest. No [facility] SLP notes seen in paper chart.      SLP Plan  Other (Comment) (FEES)        Recommendations  Diet recommendations: NPO                Oral Care Recommendations: Oral care QID;Staff/trained caregiver to provide oral care Follow up Recommendations: Skilled Nursing facility SLP Visit Diagnosis: Dysphagia, unspecified (R13.10) Plan: Other (Comment) (FEES)       GO                Tuleen Mandelbaum, Riley Nearing 07/28/2021, 10:05 AM

## 2021-07-28 NOTE — Evaluation (Signed)
Occupational Therapy Evaluation Patient Details Name: Benjamin Barnett MRN: [id number] DOB: [birth year] Today's Date: 07/28/2021    History of Present Illness 75 y.o. from [employer] (d/c'd from [facility] earlier that day after several week admission) presenting to ED 8/11 with AMS. Patient with multiple SIRS criteria admitted with sepsis. This admission complicated by acute respiratory failure with hypoxia intubated from 8/12-8/15  for airway protection. PMHx significant for PEA arrest with extended hospital stay 6/2-6/16., HTN, HLD, retroperitoneal hematoma, G-tube placement and MRSA pneumonia.   Clinical Impression   Patient is a poor historian. PLOF and home set-up obtained from daughter via phone call. Prior to extensive hospitalization in June, patient was living alone in a private residence (daughter lived next door) and was grossly Mod I with ADLs/IADLs with intermittent use of SPC. Patient currently functioning below baseline demonstrating observed ADLs including face washing with Max A to Total A grossly at bedlevel. Bedlevel eval completed as requires +2 assist. Patient also limited by deficits listed below including generalized, decrease cognition and decreased activity tolerance and would benefit from continued acute OT services in prep for safe d/c to next level of care with recommendation for SNF rehab.     Follow Up Recommendations  SNF;Supervision/Assistance - 24 hour    Equipment Recommendations  Other (comment) (Defer to next level of care.)    Recommendations for Other Services       Precautions / Restrictions Precautions Precautions: Fall Restrictions Weight Bearing Restrictions: No      Mobility Bed Mobility Overal bed mobility: Needs Assistance Bed Mobility: Rolling Rolling: Total assist;+2 for physical assistance;+2 for safety/equipment         General bed mobility comments: Total A +2 for all parts of bed mobility.    Transfers                  General transfer comment: Deferred. Patient requires +2 assist.    Balance                                           ADL either performed or assessed with clinical judgement   ADL Overall ADL's : Needs assistance/impaired Eating/Feeding: NPO   Grooming: Maximal assistance Grooming Details (indicate cue type and reason): Max A with hand over hand to wash face in supine.             Lower Body Dressing: Total assistance;Bed level Lower Body Dressing Details (indicate cue type and reason): Total A to don footwear in supine.                     Vision Baseline Vision/History: Wears glasses Wears Glasses: Reading only Patient Visual Report: Other (comment) (Difficult to assess.) Additional Comments: Difficult to assess 2/2 decreased cognition     Perception     Praxis      Pertinent Vitals/Pain Pain Assessment: Faces Faces Pain Scale: No hurt Pain Intervention(s): Monitored during session     Hand Dominance     Extremity/Trunk Assessment Upper Extremity Assessment Upper Extremity Assessment: Generalized weakness;Difficult to assess due to impaired cognition   Lower Extremity Assessment Lower Extremity Assessment: Defer to PT evaluation   Cervical / Trunk Assessment Cervical / Trunk Assessment: Other exceptions Cervical / Trunk Exceptions: Large body habitus.   Communication Communication Communication: Receptive difficulties;Expressive difficulties   Cognition Arousal/Alertness: Awake/alert;Lethargic Behavior During Therapy: Restless Overall Cognitive  Status: Impaired/Different from baseline Area of Impairment: Orientation;Attention;Memory;Following commands;Awareness;Problem solving                 Orientation Level: Disoriented to;Place;Time;Situation Current Attention Level: Focused Memory: Decreased short-term memory Following Commands: Follows one step commands inconsistently   Awareness:  Intellectual Problem Solving: Slow processing;Decreased initiation;Difficulty sequencing;Requires verbal cues;Requires tactile cues General Comments: Patient following <25% of all verbal commands this date; unintelligible speech throughout   General Comments       Exercises     Shoulder Instructions      Home Living Family/patient expects to be discharged to:: Private residence Living Arrangements: Alone Available Help at Discharge: Friend(s);Available PRN/intermittently Type of Home: House Home Access: Ramped entrance     Home Layout: One level     Bathroom Shower/Tub: Walk-in shower;Door   Bathroom Toilet: Handicapped height     Home Equipment: Cane - single point;Walker - 2 wheels          Prior Functioning/Environment Level of Independence: Independent with assistive device(s)        Comments: Patient is a poor historian with history obtained from daughter via phone call. Prior to 05/2021 patient was I with ADLs/IADLs; I with money management; daughter provided transportation since Feb 2022; able to make meals for himself but daughter provided dinner most days.        OT Problem List: Decreased strength;Decreased range of motion;Decreased activity tolerance;Impaired balance (sitting and/or standing);Decreased coordination;Decreased cognition;Decreased safety awareness;Decreased knowledge of use of DME or AE;Cardiopulmonary status limiting activity;Obesity;Impaired UE functional use      OT Treatment/Interventions: Self-care/ADL training;Therapeutic exercise;Energy conservation;DME and/or AE instruction;Therapeutic activities;Patient/family education;Balance training    OT Goals(Current goals can be found in the care plan section) Acute Rehab OT Goals Patient Stated Goal: Via daughter for patient to get better. OT Goal Formulation: With family Time For Goal Achievement: 08/11/21 Potential to Achieve Goals: Good ADL Goals Pt Will Perform Eating:  Independently;sitting Pt Will Perform Grooming: with min assist;sitting Pt Will Perform Upper Body Dressing: with min assist;sitting Pt Will Perform Lower Body Dressing: with min assist;sit to/from stand;sitting/lateral leans Pt Will Transfer to Toilet: with min assist;ambulating;bedside commode Pt Will Perform Toileting - Clothing Manipulation and hygiene: with min assist;sit to/from stand;sitting/lateral leans Pt/caregiver will Perform Home Exercise Program: Increased ROM;Increased strength;Both right and left upper extremity;With Supervision Additional ADL Goal #1: Patient will demonstrate bed mobility with Min A in prep for ADLs and functional transfers. Additional ADL Goal #2: Patient will complete sit to stand transfers with Min A +2 in prep for ADLs and functional transfers.  OT Frequency: Min 2X/week   Barriers to D/C:            Co-evaluation              AM-PAC OT "6 Clicks" Daily Activity     Outcome Measure Help from another person eating meals?: Total Help from another person taking care of personal grooming?: A Lot Help from another person toileting, which includes using toliet, bedpan, or urinal?: Total Help from another person bathing (including washing, rinsing, drying)?: Total Help from another person to put on and taking off regular upper body clothing?: Total Help from another person to put on and taking off regular lower body clothing?: Total 6 Click Score: 7   End of Session Nurse Communication: Mobility status  Activity Tolerance: Patient tolerated treatment well (Session limited to bedlevel only. Patient requires +2 assist.) Patient left: in bed;with call bell/phone within reach;with bed alarm set  OT Visit Diagnosis: Unsteadiness on feet (R26.81);Other abnormalities of gait and mobility (R26.89);Muscle weakness (generalized) (M62.81);Other symptoms and signs involving cognitive function                Time: 8527-7824 OT Time Calculation (min): 14  min Charges:  OT General Charges $OT Visit: 1 Visit OT Evaluation $OT Eval Moderate Complexity: 1 Mod  Yianni Skilling H. OTR/L Supplemental OT, Department of rehab services [phone]  Airis Barbee R H. 07/28/2021, 12:17 PM

## 2021-07-28 NOTE — Progress Notes (Signed)
OT Cancellation Note  Patient Details Name: Benjamin Barnett MRN: [id number] DOB: 1945-12-29   Cancelled Treatment:    Reason Eval/Treat Not Completed: Active bedrest order.  Kallie Edward OTR/L Supplemental OT, Department of rehab services [phone]  Madix Blowe R H. 07/28/2021, 7:14 AM

## 2021-07-28 NOTE — Progress Notes (Signed)
PT Cancellation Note  Patient Details Name: Benjamin Barnett MRN: [id number] DOB: 1946-05-17   Cancelled Treatment:    Reason Eval/Treat Not Completed: Active bedrest order  Will monitor for updated activity orders   Jerolyn Center, PT Pager [phone]   Zena Amos 07/28/2021, 8:22 AM

## 2021-07-28 NOTE — Progress Notes (Signed)
eLink Physician-Brief Progress Note Patient Name: Benjamin Barnett DOB: [birth year] MRN: [id number]   Date of Service  07/28/2021  HPI/Events of Note  Notified patient fidgety and request for sleep aid Patient seen restless with legs half way off the bed Patient has Seroquel, Remeron and Zoloft as home medications which are not ordered  eICU Interventions  Will give a one time dose of Seroquel 50 mg for tonight     Intervention Category Minor Interventions: Agitation / anxiety - evaluation and management  Darl Pikes 07/28/2021, 12:13 AM

## 2021-07-28 NOTE — Evaluation (Signed)
Physical Therapy Evaluation Patient Details Name: Benjamin Barnett MRN: [id number] DOB: 19-Feb-1946 Today's Date: 07/28/2021   History of Present Illness  75 y.o. from [employer] (d/c'd from [facility] earlier that day after several week admission) presenting to ED 8/11 with AMS. Patient with multiple SIRS criteria admitted with sepsis. This admission complicated by acute respiratory failure with hypoxia intubated from 8/12-8/15  for airway protection. PMHx significant for PEA arrest with extended hospital stay 6/2-6/16., HTN, HLD, retroperitoneal hematoma, G-tube placement and MRSA pneumonia.  Clinical Impression   Pt admitted secondary to problem above with deficits below. PTA patient was most recently discharged from [facility] to SNF (he was there <1 day and admitted to hospital). Prior to [facility] he was hospitalized in 05/2021, which before that he was living alone. He was walking with cane and managing most ADLs himself (some assist from daughter--see OT evaluation).  Pt currently requires total assist for any mobility (including PROM) as he is lethargic and not following commands. He did state his name and followed some commands for OT earlier today.   Anticipate patient may benefit from PT to address problems listed below.Will continue to follow acutely to maximize functional mobility independence and safety.       Follow Up Recommendations SNF;Supervision/Assistance - 24 hour    Equipment Recommendations  None recommended by PT    Recommendations for Other Services       Precautions / Restrictions Precautions Precautions: Fall Restrictions Weight Bearing Restrictions: No      Mobility  Bed Mobility Overal bed mobility: Needs Assistance Bed Mobility: Rolling Rolling: Total assist;+2 for physical assistance;+2 for safety/equipment         General bed mobility comments: Total A +2 for all parts of bed mobility.    Transfers                 General transfer  comment: Deferred. Patient requires +2 assist and currently not arousing/following commands  Ambulation/Gait                Stairs            Wheelchair Mobility    Modified Rankin (Stroke Patients Only)       Balance                                             Pertinent Vitals/Pain Pain Assessment: Faces Faces Pain Scale: No hurt Pain Intervention(s): Monitored during session    Home Living Family/patient expects to be discharged to:: Private residence Living Arrangements: Alone Available Help at Discharge: Friend(s);Available PRN/intermittently Type of Home: House Home Access: Ramped entrance     Home Layout: One level Home Equipment: Cane - single point;Walker - 2 wheels      Prior Function Level of Independence: Independent with assistive device(s)         Comments: Patient is a poor historian with history obtained from daughter via phone call. Prior to 05/2021 patient was I with ADLs/IADLs; I with money management; daughter provided transportation since Feb 2022; able to make meals for himself but daughter provided dinner most days. After prolonged hospitalization 05/2021, pt discharged to [facility] and recently discharged from there to SNF. He was at [facility] <1 day before sent to ED.     Hand Dominance        Extremity/Trunk Assessment   Upper Extremity Assessment Upper Extremity  Assessment: Defer to OT evaluation    Lower Extremity Assessment Lower Extremity Assessment: RLE deficits/detail;LLE deficits/detail RLE Deficits / Details: hip/knee flexion limited to 45 degrees due to stiffness; ankle to neutral; followed no commands for AROM LLE Deficits / Details: PROM WFL hip/knee/ankle; followed no commands    Cervical / Trunk Assessment Cervical / Trunk Assessment: Other exceptions Cervical / Trunk Exceptions: Large body habitus. PROM cervical rotation WFL, pt resists neck flexion (no grimacing but ?due to pain).  Communication    Communication: Receptive difficulties;Expressive difficulties  Cognition Arousal/Alertness: Lethargic Behavior During Therapy: Flat affect Overall Cognitive Status: Difficult to assess Area of Impairment: Orientation;Attention;Memory;Following commands;Awareness;Problem solving                 Orientation Level: Disoriented to;Place;Time;Situation Current Attention Level: Focused Memory: Decreased short-term memory Following Commands: Follows one step commands inconsistently   Awareness: Intellectual Problem Solving: Slow processing;Decreased initiation;Difficulty sequencing;Requires verbal cues;Requires tactile cues General Comments: Patient followed 0 commands during PT evaluation      General Comments      Exercises General Exercises - Lower Extremity Ankle Circles/Pumps: PROM;Both;5 reps Heel Slides: PROM;Both;5 reps Hip ABduction/ADduction: PROM;Both;5 reps   Assessment/Plan    PT Assessment Patient needs continued PT services  PT Problem List Decreased strength;Decreased range of motion;Decreased activity tolerance;Decreased balance;Decreased mobility;Decreased cognition;Decreased knowledge of use of DME;Obesity       PT Treatment Interventions DME instruction;Gait training;Functional mobility training;Therapeutic activities;Therapeutic exercise;Balance training;Neuromuscular re-education;Cognitive remediation;Patient/family education    PT Goals (Current goals can be found in the Care Plan section)  Acute Rehab PT Goals Patient Stated Goal: Via daughter for patient to get better. PT Goal Formulation: Patient unable to participate in goal setting Time For Goal Achievement: 08/11/21 Potential to Achieve Goals: Fair    Frequency Min 2X/week   Barriers to discharge Decreased caregiver support      Co-evaluation               AM-PAC PT "6 Clicks" Mobility  Outcome Measure Help needed turning from your back to your side while in a flat bed without  using bedrails?: Total Help needed moving from lying on your back to sitting on the side of a flat bed without using bedrails?: Total Help needed moving to and from a bed to a chair (including a wheelchair)?: Total Help needed standing up from a chair using your arms (e.g., wheelchair or bedside chair)?: Total Help needed to walk in hospital room?: Total Help needed climbing 3-5 steps with a railing? : Total 6 Click Score: 6    End of Session Equipment Utilized During Treatment: Oxygen Activity Tolerance: Patient limited by lethargy Patient left: in bed;with call bell/phone within reach;with bed alarm set Nurse Communication: Other (comment) (not following commands/lethargic at this time) PT Visit Diagnosis: Muscle weakness (generalized) (M62.81);Difficulty in walking, not elsewhere classified (R26.2)    Time: 1610-9604 PT Time Calculation (min) (ACUTE ONLY): 10 min   Charges:   PT Evaluation $PT Eval Low Complexity: 1 Low           Jerolyn Center, PT Pager [phone]   Zena Amos 07/28/2021, 2:04 PM

## 2021-07-28 NOTE — Progress Notes (Signed)
Pharmacy Electrolyte Replacement  Recent Labs:  Recent Labs    07/28/21 0013  K 3.1*  PHOS 1.1*  CREATININE 1.10   No critical values noted, however Phos 1.1.    Plan: NaPhos 45 mmol IV once   Jani Gravel, PharmD PGY-1 Acute Care Resident  07/28/2021 1:28 PM

## 2021-07-28 NOTE — Progress Notes (Signed)
PROGRESS NOTE    Benjamin Barnett  LDJ:[id number] DOB: 1946-09-14 DOA: 07/23/2021 PCP: Benjamin Brothers, MD    Brief Narrative:  [name] was admitted to the hospital with the working diagnosis of sepsis due to right-sided suppurative parotitis.   75 year old male past medical history for hypertension, dyslipidemia, gout and reflux who presented with lethargy.  Recent prolonged hospitalization for PEA arrest 05/2021, developed anoxic brain injury, eventually discharged to [facility].  He was transferred back to the hospital due to lethargy.  He was unable to give any detailed history due to severe cognitive impairment.  Apparently patient was transferred from [facility] to [place] [facility] on 8/11, he was noted to be lethargic and poorly responsive, EMS was called and patient was brought to the hospital.  His blood pressure was 206/102, heart rate 118, respiratory rate 17, oxygen saturation 97%, with scattered rhonchi bilaterally, heart S1-S2, present, tachycardic, abdomen soft nontender, no lower extremity edema.  He was extremely lethargic and minimally arousable.  Not responsive to verbal stimuli.  Not able to follow commands or answer questions.   Sodium 144, potassium 3.7, chloride 103, bicarb 33, glucose 147, BUN 27, creatinine 1.46, white count 16.0, hemoglobin 13.9, hematocrit 44.8, platelets 269 SARS COVID-19 negative.   Urinalysis specific gravity 1.013.   Head CT negative for acute changes.  Positive atrophy, ventricular enlargement. Renal CT with no significant hydronephrosis.  10 mm stone in the bladder.  Residual symmetric enlargement of right greater than left psoas muscle decreased from prior CT from July, consistent with resolving retroperitoneal hematoma.   Soft tissue CT neck, multi spatial inflammation and edema in the right face and neck, moderately involving the right lateral parapharyngeal wall and the supraglottic larynx.  No airway compromise.  Multifocal right-sided swallow  adenitis, bulky inflammatory right parotid gland. Inflamed right submandibular gland. Bulky calcified carotid plaque with hemodynamically significant proximal left ICA stenosis.   Chest radiograph no infiltrates.   EKG 111 bpm, normal axis, normal intervals, sinus rhythm, no significant ST segment or T wave changes.  Placed on broad spectrum antibiotic therapy.  Patient continue to rapidly deteriorate with worsening mentation and high risk of loosing his airway. 8/12.  ENT consulted no frank upper airway obstruction.   Because a worsening mentation and sepsis, patient was intubated and placed on mechanical ventilation.   Patient clinically improved and was successfully liberated from mechanical ventilation on 08/15  Transferred to [facility] on 07/28/21.   Assessment & Plan:   Principal Problem:   Sepsis ([place]) Active Problems:   Acute suppurative parotitis   Acute metabolic encephalopathy   Protein calorie malnutrition (HCC)   GERD without esophagitis   Lactic acidosis   Hypokalemia   Essential hypertension   Pressure injury of skin   Severe sepsis due to right suppurative parotitis/ end-organ failure hypoxemic respiratory failure. Patient continue to have significant edema at the right parotid gland. Erythema has improved, no abscess per imaging.  Wbc is 11.6  Blood cultures with no growth. Positive MRSA screen.  Plan to continue antibiotic therapy with Unasyn and Linezolid.  Follow cell count and temperature curve.  Hold on further steroids.   2. Metabolic encephalopathy, baseline cognitive impairment due to anoxic brain injury as consequence of cardiac arrest. Swallow dysfunction sp PEG Patient responds to voice and touch, he is confused, but not agitated, has mittens in bilateral hands.   Continue neuro checks per uni protocol. Will continue close observation in progressive care for now. Aspiration precautions and nutritional support per  tube feedings.  Follow up blood gas  today with no hypercapnia, pH 7.49, PCo2 31.1, Pa02 65, bicarb 24.  Continue with amantadine.  Patient had quetiapine this am 01:00  3. AKI hypokalemia. hypophosphatemia renal function with serum cr at 1,1 with K at 3,3 and serum bicarbonate at 20. Continue nutritional support and water flushes per peg tube. Add Kcl 40 meq x2 today and follow up renal function in am.  K phos today.   4. HTN Continue blood pressure monitoring. Amlodipine for blood pressure control.   5. Diarrhea. Continue close monitoring, replete K and continue nutritional supper per tube feedings.   6. T2DM. Continue insulin sliding scale for glucose cover and monitoring.   Patient continue to be at high risk for worsening encephalopathy   Status is: Inpatient  Remains inpatient appropriate because:Inpatient level of care appropriate due to severity of illness  Dispo: The patient is from: SNF              Anticipated d/c is to: SNF              Patient currently is not medically stable to d/c.   Difficult to place patient No   DVT prophylaxis: Enoxaparin   Code Status:    Full  Family Communication:  no family at the bedside       Nutrition Status: Nutrition Problem: Inadequate oral intake Etiology: inability to eat Signs/Symptoms: NPO status Interventions: Tube feeding, Prostat    Consultants:  ENT    Antimicrobials:  Unasyn and Linezolid     Subjective: Patient is confused, but not agitated, eyes closed but easy to arouse, responds to yes and no questions, mittens in place.   Objective: Vitals:   07/28/21 0800 07/28/21 0900 07/28/21 1105 07/28/21 1200  BP: (!) 168/103 (!) 165/82    Pulse: 90 85    Resp: (!) 26 19    Temp:   (!) 97.4 F (36.3 C) 97.9 F (36.6 C)  TempSrc:   Oral Axillary  SpO2: 98% 98%    Weight:      Height:        Intake/Output Summary (Last 24 hours) at 07/28/2021 1506 Last data filed at 07/28/2021 0900 Gross per 24 hour  Intake 1892.95 ml  Output 2575 ml   Net -682.05 ml   Filed Weights   07/23/21 2059 07/24/21 1906  Weight: 96.6 kg 91.1 kg    Examination:   General:  deconditioned and ill looking appearing  Neurology: eyes closed but easy to arouse, answers to yes and no questions. No following commands, mittens in place.  E ENT: mild pallor, no icterus, oral mucosa moist Cardiovascular: No JVD. S1-S2 present, rhythmic, no gallops, rubs, or murmurs. No lower extremity edema. Pulmonary:  positive breath sounds bilaterally, with no wheezing, rhonchi or rales. Gastrointestinal. Abdomen soft and non tender. PEG tube in place.  Skin. No rashes Musculoskeletal: no joint deformities Rectal tube with watery brown stool.     Data Reviewed: I have personally reviewed following labs and imaging studies  CBC: Recent Labs  Lab 07/23/21 1933 07/23/21 2026 07/24/21 0500 07/24/21 2254 07/25/21 0548 07/26/21 0111 07/27/21 0625 07/28/21 0013 07/28/21 1443  WBC 16.0*  --  17.1*  --  13.3* 10.2 9.7 11.6*  --   NEUTROABS 13.4*  --  14.3*  --   --   --   --   --   --   HGB 13.9   < > 12.4*   < > 10.0*  9.0* 9.7* 10.2* 10.5*  HCT 44.8   < > 40.6   < > 31.4* 29.0* 31.0* 33.1* 31.0*  MCV 93.9  --  94.6  --  91.5 92.1 92.3 93.0  --   PLT 269  --  214  --  165 146* 163 153  --    < > = values in this interval not displayed.   Basic Metabolic Panel: Recent Labs  Lab 07/24/21 0500 07/24/21 2254 07/25/21 0548 07/26/21 0111 07/27/21 0625 07/28/21 0013 07/28/21 1443  NA 142   < > 139 139 139 138 141  K 3.3*   < > 3.8 3.8 3.8 3.1* 3.3*  CL 103  --  105 107 107 109  --   CO2 27  --  23 21* 26 20*  --   GLUCOSE 123*  --  165* 156* 101* 105*  --   BUN 24*  --  31* 44* 44* 37*  --   CREATININE 1.33*  --  1.45* 1.57* 1.31* 1.10  --   CALCIUM 11.0*  --  10.2 9.6 9.2 8.9  --   MG 1.7  --  1.8  --   --   --   --   PHOS  --   --  2.7 2.8 3.0 1.1*  --    < > = values in this interval not displayed.   GFR: Estimated Creatinine Clearance: 68.9  mL/min (by C-G formula based on SCr of 1.1 mg/dL). Liver Function Tests: Recent Labs  Lab 07/23/21 1933 07/24/21 0500 07/25/21 0548 07/26/21 0111 07/27/21 0625 07/28/21 0013  AST 26 21  --   --   --   --   ALT 17 17  --   --   --   --   ALKPHOS 70 66  --   --   --   --   BILITOT 1.0 1.1  --   --   --   --   PROT 8.5* 7.4  --   --   --   --   ALBUMIN 3.5 2.9* 2.2* 2.1* 2.4* 2.5*   No results for input(s): LIPASE, AMYLASE in the last 168 hours. No results for input(s): AMMONIA in the last 168 hours. Coagulation Profile: Recent Labs  Lab 07/23/21 1933 07/24/21 0500  INR 1.2 1.2   Cardiac Enzymes: No results for input(s): CKTOTAL, CKMB, CKMBINDEX, TROPONINI in the last 168 hours. BNP (last 3 results) No results for input(s): PROBNP in the last 8760 hours. HbA1C: No results for input(s): HGBA1C in the last 72 hours. CBG: Recent Labs  Lab 07/27/21 2027 07/27/21 2324 07/28/21 0357 07/28/21 0722 07/28/21 1103  GLUCAP 105* 113* 88 95 89   Lipid Profile: Recent Labs    07/27/21 0625  TRIG 136   Thyroid Function Tests: No results for input(s): TSH, T4TOTAL, FREET4, T3FREE, THYROIDAB in the last 72 hours. Anemia Panel: No results for input(s): VITAMINB12, FOLATE, FERRITIN, TIBC, IRON, RETICCTPCT in the last 72 hours.    Radiology Studies: I have reviewed all of the imaging during this hospital visit personally     Scheduled Meds:  amantadine  100 mg Per Tube BID   amLODipine  5 mg Per Tube Daily   chlorhexidine gluconate (MEDLINE KIT)  15 mL Mouth Rinse BID   Chlorhexidine Gluconate Cloth  6 each Topical Daily   Chlorhexidine Gluconate Cloth  6 each Topical Q0600   ciprofloxacin  1 drop Both Eyes TID   docusate  100 mg Per  Tube BID   enoxaparin (LOVENOX) injection  40 mg Subcutaneous Daily   feeding supplement (PROSource TF)  90 mL Per Tube BID   free water  200 mL Per Tube Q8H   insulin aspart  0-9 Units Subcutaneous Q4H   mouth rinse  15 mL Mouth Rinse  10 times per day   metoCLOPramide  10 mg Per Tube TID AC & HS   mupirocin ointment  1 application Nasal BID   pantoprazole sodium  40 mg Per Tube Daily   polyethylene glycol  17 g Per Tube Daily   Zinc Oxide   Topical BID   Continuous Infusions:  sodium chloride Stopped (07/27/21 1046)   ampicillin-sulbactam (UNASYN) IV 3 g (07/28/21 1039)   feeding supplement (NEPRO CARB STEADY) 1,000 mL (07/28/21 0441)   linezolid (ZYVOX) IV 600 mg (07/28/21 1215)   sodium phosphate  Dextrose 5% IVPB 45 mmol (07/28/21 1405)     LOS: 5 days         Gerome Apley, MD

## 2021-07-29 ENCOUNTER — Inpatient Hospital Stay (HOSPITAL_COMMUNITY): Payer: Medicare Other

## 2021-07-29 DIAGNOSIS — G931 Anoxic brain damage, not elsewhere classified: Secondary | ICD-10-CM

## 2021-07-29 DIAGNOSIS — Z931 Gastrostomy status: Secondary | ICD-10-CM

## 2021-07-29 LAB — CBC WITH DIFFERENTIAL/PLATELET
Abs Immature Granulocytes: 0.43 10*3/uL — ABNORMAL HIGH (ref 0.00–0.07)
Basophils Absolute: 0 10*3/uL (ref 0.0–0.1)
Basophils Relative: 1 %
Eosinophils Absolute: 0.4 10*3/uL (ref 0.0–0.5)
Eosinophils Relative: 5 %
HCT: 32.3 % — ABNORMAL LOW (ref 39.0–52.0)
Hemoglobin: 10.6 g/dL — ABNORMAL LOW (ref 13.0–17.0)
Immature Granulocytes: 5 %
Lymphocytes Relative: 14 %
Lymphs Abs: 1.2 10*3/uL (ref 0.7–4.0)
MCH: 29 pg (ref 26.0–34.0)
MCHC: 32.8 g/dL (ref 30.0–36.0)
MCV: 88.5 fL (ref 80.0–100.0)
Monocytes Absolute: 0.7 10*3/uL (ref 0.1–1.0)
Monocytes Relative: 8 %
Neutro Abs: 5.9 10*3/uL (ref 1.7–7.7)
Neutrophils Relative %: 67 %
Platelets: 148 10*3/uL — ABNORMAL LOW (ref 150–400)
RBC: 3.65 MIL/uL — ABNORMAL LOW (ref 4.22–5.81)
RDW: 15.5 % (ref 11.5–15.5)
WBC: 8.7 10*3/uL (ref 4.0–10.5)
nRBC: 0.2 % (ref 0.0–0.2)

## 2021-07-29 LAB — GLUCOSE, CAPILLARY
Glucose-Capillary: 111 mg/dL — ABNORMAL HIGH (ref 70–99)
Glucose-Capillary: 112 mg/dL — ABNORMAL HIGH (ref 70–99)
Glucose-Capillary: 114 mg/dL — ABNORMAL HIGH (ref 70–99)
Glucose-Capillary: 114 mg/dL — ABNORMAL HIGH (ref 70–99)
Glucose-Capillary: 117 mg/dL — ABNORMAL HIGH (ref 70–99)
Glucose-Capillary: 130 mg/dL — ABNORMAL HIGH (ref 70–99)
Glucose-Capillary: 143 mg/dL — ABNORMAL HIGH (ref 70–99)

## 2021-07-29 LAB — BASIC METABOLIC PANEL
Anion gap: 9 (ref 5–15)
BUN: 25 mg/dL — ABNORMAL HIGH (ref 8–23)
CO2: 23 mmol/L (ref 22–32)
Calcium: 8.8 mg/dL — ABNORMAL LOW (ref 8.9–10.3)
Chloride: 104 mmol/L (ref 98–111)
Creatinine, Ser: 1 mg/dL (ref 0.61–1.24)
GFR, Estimated: >60 mL/min (ref >60)
Glucose, Bld: 104 mg/dL — ABNORMAL HIGH (ref 70–99)
Potassium: 3.2 mmol/L — ABNORMAL LOW (ref 3.5–5.1)
Sodium: 136 mmol/L (ref 135–145)

## 2021-07-29 LAB — MAGNESIUM: Magnesium: 1.6 mg/dL — ABNORMAL LOW (ref 1.7–2.4)

## 2021-07-29 MED ORDER — POTASSIUM CHLORIDE 20 MEQ PO PACK
20.0000 meq | PACK | ORAL | Status: AC
Start: 2021-07-29 — End: 2021-07-29
  Administered 2021-07-29 (×3): 20 meq via ORAL
  Filled 2021-07-29 (×3): qty 1

## 2021-07-29 MED ORDER — AMLODIPINE BESYLATE 10 MG PO TABS
10.0000 mg | ORAL_TABLET | Freq: Every day | ORAL | Status: DC
  Administered 2021-07-30 – 2021-08-02 (×4): 10 mg
  Filled 2021-07-29 (×4): qty 1

## 2021-07-29 MED ORDER — MAGNESIUM OXIDE -MG SUPPLEMENT 400 (240 MG) MG PO TABS
400.0000 mg | ORAL_TABLET | Freq: Two times a day (BID) | ORAL | Status: DC
  Administered 2021-07-29: 400 mg via ORAL
  Filled 2021-07-29: qty 1

## 2021-07-29 MED ORDER — POTASSIUM CHLORIDE CRYS ER 20 MEQ PO TBCR: 30.0000 meq | EXTENDED_RELEASE_TABLET | ORAL | Status: DC

## 2021-07-29 MED ORDER — MAGNESIUM SULFATE 2 GM/50ML IV SOLN
2.0000 g | Freq: Once | INTRAVENOUS | Status: AC
  Administered 2021-07-29: 2 g via INTRAVENOUS
  Filled 2021-07-29: qty 50

## 2021-07-29 MED ORDER — POTASSIUM CHLORIDE 20 MEQ PO PACK
20.0000 meq | PACK | Freq: Two times a day (BID) | ORAL | Status: DC
  Administered 2021-07-29: 20 meq
  Filled 2021-07-29: qty 1

## 2021-07-29 NOTE — Procedures (Signed)
Objective Swallowing Evaluation: Type of Study: FEES-Fiberoptic Endoscopic Evaluation of Swallow   Patient Details  Name: Benjamin Barnett MRN: [id number] Date of Birth: [birth year]  Today's Date: 07/29/2021 Time: SLP Start Time (ACUTE ONLY): 0920 -SLP Stop Time (ACUTE ONLY): 0958  SLP Time Calculation (min) (ACUTE ONLY): 38 min   Past Medical History:  Past Medical History:  Diagnosis Date   Cardiac arrest (HCC) 07/24/2021   DVT of axillary vein, acute right (HCC) 05/2021   Essential hypertension 07/24/2021   GERD without esophagitis 07/24/2021   Gout 07/24/2021   History of MRSA infection of lungs 07/24/2021   Mixed hyperlipidemia 07/24/2021   Retroperitoneal hematoma 2022   Past Surgical History:  Past Surgical History:  Procedure Laterality Date   IR GASTROSTOMY TUBE MOD SED  06/16/2021   HPI: Pt is a 75 y.o. male who presented to the ED from [facility] with AMS. ETT 8/12-8/15 (0930). 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 CT soft tissue neck 8/12: Multispatial inflammation and edema in the right face and neck, moderately involving the right lateral pharyngeal wall and the supraglottic larynx. ENT on 8/12 reports "There is no airway compromise by imaging or by clinical examination." PMH: prior PEA arrest with anoxic brain injury, retroperitoneal hematoma, PEG, HTN, GERD, and prior MRSA pneumonia. Patient was recently discharged to SNF after prolonged hospital stay after suffering a PEA arrest and retroperitoneal hematoma.  Patient was only at SNF for approx 1 hr before EMS was called. Per daughter pts swallowing has never been assessed since his arrest. No [facility] notes seen in paper chart.   No data recorded   Assessment / Plan / Recommendation  CHL IP CLINICAL IMPRESSIONS 07/29/2021  Clinical Impression Pt presents with a fully cognitive dysphagia. He requires heavy cueing for awareness of spoon, cup and straw  mostly with total assisted feeding. Hand over hand assist is helpful, but was difficult to provide during FEES. Majority of boluses provided were via cup and spoon with bolus entering mouth with decreased awareness and late oral manipulation with liquids spilling to pharynx and resulting in flash penetration. Pts laryngeal mobility and swallow response otherwise adequate with no aspiration, only flash penetration. Puree tolerated well. Limited study due to scope eliciting gag and subsequent vomiting with study discontinued. Pt may continue with SLP swallowing therapy with trials of thin liquids and solids to improve pts awareness and initiation with PO. May be able to progress to diet if cognition improves.  SLP Visit Diagnosis Dysphagia, unspecified (R13.10)  Attention and concentration deficit following --  Frontal lobe and executive function deficit following --  Impact on safety and function Mild aspiration risk      CHL IP TREATMENT RECOMMENDATION 07/29/2021  Treatment Recommendations Therapy as outlined in treatment plan below     Prognosis 07/29/2021  Prognosis for Safe Diet Advancement Good  Barriers to Reach Goals Cognitive deficits;Severity of deficits  Barriers/Prognosis Comment --    CHL IP DIET RECOMMENDATION 07/29/2021  SLP Diet Recommendations Alternative means - long-term;Other (Comment)  Liquid Administration via --  Medication Administration Via alternative means  Compensations --  Postural Changes --      CHL IP OTHER RECOMMENDATIONS 07/29/2021  Recommended Consults --  Oral Care Recommendations Oral care QID  Other Recommendations --      CHL IP FOLLOW UP RECOMMENDATIONS 07/29/2021  Follow up Recommendations Skilled Nursing facility      [facility] IP FREQUENCY AND DURATION 07/29/2021  Speech Therapy  Frequency (ACUTE ONLY) min 2x/week  Treatment Duration 2 weeks           CHL IP ORAL PHASE 07/29/2021  Oral Phase Impaired  Oral - Pudding Teaspoon --  Oral - Pudding  Cup --  Oral - Honey Teaspoon --  Oral - Honey Cup --  Oral - Nectar Teaspoon --  Oral - Nectar Cup --  Oral - Nectar Straw --  Oral - Thin Teaspoon Premature spillage;Decreased bolus cohesion;Other (Comment)  Oral - Thin Cup Premature spillage;Decreased bolus cohesion  Oral - Thin Straw Premature spillage;Decreased bolus cohesion  Oral - Puree Premature spillage;Decreased bolus cohesion  Oral - Mech Soft --  Oral - Regular --  Oral - Multi-Consistency --  Oral - Pill --  Oral Phase - Comment --    CHL IP PHARYNGEAL PHASE 07/29/2021  Pharyngeal Phase Impaired  Pharyngeal- Pudding Teaspoon --  Pharyngeal --  Pharyngeal- Pudding Cup --  Pharyngeal --  Pharyngeal- Honey Teaspoon --  Pharyngeal --  Pharyngeal- Honey Cup --  Pharyngeal --  Pharyngeal- Nectar Teaspoon --  Pharyngeal --  Pharyngeal- Nectar Cup --  Pharyngeal --  Pharyngeal- Nectar Straw --  Pharyngeal --  Pharyngeal- Thin Teaspoon Penetration/Aspiration before swallow  Pharyngeal Material enters airway, CONTACTS cords and then ejected out;Material does not enter airway  Pharyngeal- Thin Cup Penetration/Aspiration before swallow  Pharyngeal Material enters airway, CONTACTS cords and then ejected out;Material does not enter airway  Pharyngeal- Thin Straw Penetration/Aspiration before swallow  Pharyngeal Material enters airway, CONTACTS cords and then ejected out;Material does not enter airway  Pharyngeal- Puree --  Pharyngeal --  Pharyngeal- Mechanical Soft --  Pharyngeal --  Pharyngeal- Regular --  Pharyngeal --  Pharyngeal- Multi-consistency --  Pharyngeal --  Pharyngeal- Pill --  Pharyngeal --  Pharyngeal Comment --     No flowsheet data found.   Benjamin Barnett, Benjamin Barnett 07/29/2021, 10:17 AM

## 2021-07-29 NOTE — TOC Initial Note (Addendum)
Transition of Care [facility]) - Initial/Assessment Note    Patient Details  Name: Benjamin Barnett MRN: [id number] Date of Birth: [birth year]  Transition [facility]) CM/SW Contact:    Terrial Rhodes, LCSWA Phone Number: 07/29/2021, 10:00 AM  Clinical Narrative:         CSW received consult for possible SNF placement at time of discharge. CSW spoke with patients daughter Marchelle Folks regarding PT recommendation of SNF placement at time of discharge. Patient comes from [facility] short term. Patients daughter Marchelle Folks expressed understanding of PT recommendation and is agreeable to SNF placement at time of discharge. Patients daughter reports preference for [facility] . Patient has received the COVID vaccines. Patients daughter Marchelle Folks reports patient has not received booster.No further questions reported at this time. CSW to continue to follow and assist with discharge planning needs.   Update - Patients SSN was incorrect in system. CSW verified correct SSN from patients daughter Marchelle Folks.    CSW noticed patients SSN incorrect when trying to get PASSR from [place] must. [place] must SSN was correct for patient.Once verified by patients daughter that SSN in system was incorrect. CSW updated SSN. SSN for patient is now correct.         Expected Discharge Plan: Skilled Nursing Facility Barriers to Discharge: Continued Medical Work up   Patient Goals and CMS Choice   CMS Medicare.gov Compare Post Acute Care list provided to:: Patient Represenative (must comment) (Patients daughter Marchelle Folks) Choice offered to / list presented to : Adult Children Marchelle Folks)  Expected Discharge Plan and Services Expected Discharge Plan: Skilled Nursing Facility In-house Referral: Clinical Social Work     Living arrangements for the past 2 months: Skilled Nursing Facility                                      Prior Living Arrangements/Services Living arrangements for the past 2 months: Skilled Nursing Facility Lives with:: Self,  Facility Resident (Patient came from [facility] short term) Patient language and need for interpreter reviewed:: Yes Do you feel safe going back to the place where you live?: No   SNF  Need for Family Participation in Patient Care: Yes (Comment) Care giver support system in place?: Yes (comment)   Criminal Activity/Legal Involvement Pertinent to Current Situation/Hospitalization: No - Comment as needed  Activities of Daily Living Home Assistive Devices/Equipment: None ADL Screening (condition at time of admission) Patient's cognitive ability adequate to safely complete daily activities?: No Is the patient deaf or have difficulty hearing?: Yes Does the patient have difficulty seeing, even when wearing glasses/contacts?: No Does the patient have difficulty concentrating, remembering, or making decisions?: No Patient able to express need for assistance with ADLs?: No Does the patient have difficulty dressing or bathing?: Yes Independently performs ADLs?: No Dressing (OT): Dependent Is this a change from baseline?: Pre-admission baseline Grooming: Dependent Is this a change from baseline?: Pre-admission baseline Feeding: Dependent Is this a change from baseline?: Pre-admission baseline Bathing: Dependent Is this a change from baseline?: Pre-admission baseline Toileting: Dependent Is this a change from baseline?: Pre-admission baseline In/Out Bed: Dependent Is this a change from baseline?: Pre-admission baseline Walks in Home: Dependent Is this a change from baseline?: Pre-admission baseline Does the patient have difficulty walking or climbing stairs?: Yes Weakness of Legs: Both Weakness of Arms/Hands: Both  Permission Sought/Granted Permission sought to share information with : Case Manager, Family Supports, Oceanographer granted  to share information with : No  Share Information with NAME: Patient only oriented to person, spoke with daughter  Marchelle Folks  Permission granted to share info w AGENCY: Patient only oriented to person, spoke with daughter Marchelle Folks, [place]  Permission granted to share info w Relationship: Patient only oriented to person, spoke with daughter Chipper [place]  Permission granted to share info w Contact Information: Patient only oriented to person, spoke with daughter Amanda,[phone]  Emotional Assessment       Orientation: : Oriented to Self Alcohol / Substance Use: Not Applicable Psych Involvement: No (comment)  Admission diagnosis:  Sepsis (HCC) [A41.9] Sepsis, due to unspecified organism, unspecified whether acute organ dysfunction present [facility]) [A41.9] Patient Active Problem List   Diagnosis Date Noted   Pressure injury of skin 07/27/2021   Acute suppurative parotitis 07/24/2021   Acute metabolic encephalopathy 07/24/2021   Protein calorie malnutrition (HCC) 07/24/2021   GERD without esophagitis 07/24/2021   Lactic acidosis 07/24/2021   Hypokalemia 07/24/2021   Essential hypertension 07/24/2021   Mixed hyperlipidemia 07/24/2021   Gout 07/24/2021   Cardiac arrest (HCC) 07/24/2021   Sepsis (HCC) 07/23/2021   PCP:  Eloisa Northern, MD Pharmacy:  No Pharmacies Listed    Social Determinants of Health (SDOH) Interventions    Readmission Risk Interventions No flowsheet data found.

## 2021-07-29 NOTE — NC FL2 (Addendum)
[place] MEDICAID FL2 LEVEL OF CARE SCREENING TOOL     IDENTIFICATION  Patient Name: Benjamin Barnett Birthdate: 1946/09/11 Sex: male Admission Date (Current Location): 07/23/2021  [facility] and [place] Number:  Producer, television/film/video and Address:  [facility] [place], [address] [place], [place] [address]      Provider Number: [id number]  Attending Physician Name and Address:  Albertine Grates, MD  Relative Name and Phone Number:  Marchelle Folks [phone]    Current Level of Care: Hospital Recommended Level of Care: Skilled Nursing Facility Prior Approval Number:    Date Approved/Denied:   PASRR Number: [id number]  Discharge Plan: SNF    Current Diagnoses: Patient Active Problem List   Diagnosis Date Noted   Pressure injury of skin 07/27/2021   Acute suppurative parotitis 07/24/2021   Acute metabolic encephalopathy 07/24/2021   Protein calorie malnutrition (HCC) 07/24/2021   GERD without esophagitis 07/24/2021   Lactic acidosis 07/24/2021   Hypokalemia 07/24/2021   Essential hypertension 07/24/2021   Mixed hyperlipidemia 07/24/2021   Gout 07/24/2021   Cardiac arrest (HCC) 07/24/2021   Sepsis (HCC) 07/23/2021    Orientation RESPIRATION BLADDER Height & Weight     Self  Normal Incontinent, External catheter (External Urinary Catheter) Weight: 200 lb 13.4 oz (91.1 kg) Height:  5\' 10"  (177.8 cm)  BEHAVIORAL SYMPTOMS/MOOD NEUROLOGICAL BOWEL NUTRITION STATUS      Incontinent Diet (Please see discharge summary)  AMBULATORY STATUS COMMUNICATION OF NEEDS Skin   Total Care Verbally (difficulty speaking) Other (Comment) (Avulsion arm,bil.,thin film,ecchymosis arm bil. wound incision open or dehiced,laceration ankle,posterior,R,foam lift dressing,daily,wound inc.open or dehiced skin tear,arm,anterior,L,lower 1.5X1cm,honeycomb,thin film,PRN,dressinginplace,see add info.)                       Personal Care Assistance Level of Assistance  Bathing, Feeding,  Dressing Bathing Assistance: Maximum assistance Feeding assistance: Maximum assistance (NPO; Peg Tube) Dressing Assistance: Maximum assistance     Functional Limitations Info  Sight, Hearing, Speech   Hearing Info: Adequate Speech Info: Impaired (difficulty speaking)    SPECIAL CARE FACTORS FREQUENCY  PT (By licensed PT), OT (By licensed OT)     PT Frequency: 5x min weekly OT Frequency: 5x min weekly            Contractures Contractures Info: Not present    Additional Factors Info  Code Status, Allergies, Insulin Sliding Scale, Psychotropic Code Status Info: FULL Allergies Info: Codeine,Furosemide,Ibuprofen,Meperidine,Tape Psychotropic Info: mirtazapine (REMERON) tablet 7.5 mg daily at bedtime,QUEtiapine (SEROQUEL) tablet 50 mg daily at bedtime,sertraline (ZOLOFT) tablet 50 mg daily Insulin Sliding Scale Info: insulin aspart (novoLOG) injection 0-9 Units every 4 hours,       Current Medications (07/29/2021):  This is the current hospital active medication list Current Facility-Administered Medications  Medication Dose Route Frequency Provider Last Rate Last Admin   0.9 %  sodium chloride infusion   Intravenous PRN 07/31/2021 D, NP   Stopped at 07/28/21 2105   acetaminophen (TYLENOL) tablet 650 mg  650 mg Per Tube Q6H PRN 2106, MD   650 mg at 07/29/21 0451   Or   acetaminophen (TYLENOL) suppository 650 mg  650 mg Rectal Q6H PRN 07/31/21, MD       amantadine (SYMMETREL) 50 MG/5ML solution 100 mg  100 mg Per Tube BID Paytes, Austin A, RPH   100 mg at 07/29/21 0900   amLODipine (NORVASC) tablet 5 mg  5 mg Per Tube Daily Shalhoub, 07/31/21,  MD   5 mg at 07/29/21 0900   Ampicillin-Sulbactam (UNASYN) 3 g in sodium chloride 0.9 % 100 mL IVPB  3 g Intravenous Q8H Chand, Garnet Sierras, MD 200 mL/hr at 07/29/21 0956 3 g at 07/29/21 0956   chlorhexidine (PERIDEX) 0.12 % solution 15 mL  15 mL Mouth Rinse BID Coralie Keens, MD   15 mL at 07/29/21 0858    Chlorhexidine Gluconate Cloth 2 % PADS 6 each  6 each Topical Daily Janeann Forehand D, NP   6 each at 07/28/21 1427   Chlorhexidine Gluconate Cloth 2 % PADS 6 each  6 each Topical Q0600 Karie Fetch P, DO   6 each at 07/26/21 0830   ciprofloxacin (CILOXAN) 0.3 % ophthalmic solution 1 drop  1 drop Both Eyes TID Marinda Elk, MD   1 drop at 07/29/21 0901   enoxaparin (LOVENOX) injection 40 mg  40 mg Subcutaneous Daily Shalhoub, Deno Lunger, MD   40 mg at 07/29/21 0857   feeding supplement (NEPRO CARB STEADY) liquid 1,000 mL  1,000 mL Per Tube Continuous Dolan Amen, MD 50 mL/hr at 07/29/21 0600 Infusion Verify at 07/29/21 0600   feeding supplement (PROSource TF) liquid 90 mL  90 mL Per Tube BID Dolan Amen, MD   90 mL at 07/29/21 0859   free water 200 mL  200 mL Per Tube Q8H Arrien, York Ram, MD   200 mL at 07/29/21 0605   insulin aspart (novoLOG) injection 0-9 Units  0-9 Units Subcutaneous Q4H Selmer Dominion B, NP   1 Units at 07/29/21 0018   linezolid (ZYVOX) IVPB 600 mg  600 mg Intravenous Q12H Coralie Keens, MD 300 mL/hr at 07/29/21 1002 600 mg at 07/29/21 1002   magnesium oxide (MAG-OX) tablet 400 mg  400 mg Oral BID Dow Adolph N, DO   400 mg at 07/29/21 0507   MEDLINE mouth rinse  15 mL Mouth Rinse q12n4p Arrien, York Ram, MD       metoCLOPramide (REGLAN) tablet 10 mg  10 mg Per Tube TID AC & HS Shalhoub, Deno Lunger, MD   10 mg at 07/29/21 0900   mirtazapine (REMERON) tablet 7.5 mg  7.5 mg Per Tube QHS Arrien, York Ram, MD   7.5 mg at 07/28/21 2235   mupirocin ointment (BACTROBAN) 2 % 1 application  1 application Nasal BID Cheri Fowler, MD   1 application at 07/29/21 0901   pantoprazole sodium (PROTONIX) 40 mg/20 mL oral suspension 40 mg  40 mg Per Tube Daily Shalhoub, Deno Lunger, MD   40 mg at 07/29/21 0908   polyethylene glycol (MIRALAX / GLYCOLAX) packet 17 g  17 g Per Tube Daily Shalhoub, Deno Lunger, MD   17 g at 07/29/21 0859   polyvinyl alcohol  (LIQUIFILM TEARS) 1.4 % ophthalmic solution 1 drop  1 drop Both Eyes TID PRN Shalhoub, Deno Lunger, MD       potassium chloride (KLOR-CON) packet 20 mEq  20 mEq Per Tube BID Dow Adolph N, DO   20 mEq at 07/29/21 0507   QUEtiapine (SEROQUEL) tablet 50 mg  50 mg Per Tube QHS Coralie Keens, MD   50 mg at 07/28/21 2234   sertraline (ZOLOFT) tablet 50 mg  50 mg Per Tube Daily Arrien, York Ram, MD   50 mg at 07/29/21 0900   Zinc Oxide (TRIPLE PASTE) 12.8 % ointment   Topical BID Cheri Fowler, MD   Given at 07/29/21 0857     Discharge Medications: Please  see discharge summary for a list of discharge medications.  Relevant Imaging Results:  Relevant Lab Results:   Additional Information [id number]  Wound/Incision open or dehiced (MASD) thigh anterior,proximal,R,L,mid,pink,red,wound incision,open or dehiced (MASD) sacrum circumferential, foam lift dressing  Terrial Rhodes, LCSWA

## 2021-07-29 NOTE — Plan of Care (Signed)
  Problem: Clinical Measurements: Goal: Ability to maintain clinical measurements within normal limits will improve Outcome: Progressing   Problem: Clinical Measurements: Goal: Diagnostic test results will improve Outcome: Progressing   Problem: Clinical Measurements: Goal: Respiratory complications will improve Outcome: Progressing   Problem: Clinical Measurements: Goal: Cardiovascular complication will be avoided Outcome: Progressing   Problem: Nutrition: Goal: Adequate nutrition will be maintained Outcome: Progressing   Problem: Elimination: Goal: Will not experience complications related to bowel motility Outcome: Progressing   Problem: Elimination: Goal: Will not experience complications related to urinary retention Outcome: Progressing   Problem: Pain Managment: Goal: General experience of comfort will improve Outcome: Progressing   Problem: Safety: Goal: Ability to remain free from injury will improve Outcome: Progressing   Problem: Skin Integrity: Goal: Risk for impaired skin integrity will decrease Outcome: Progressing   Problem: Nutrition: Goal: Risk of aspiration will decrease Outcome: Progressing

## 2021-07-29 NOTE — Progress Notes (Signed)
EEG complete - results pending 

## 2021-07-29 NOTE — Progress Notes (Addendum)
PROGRESS NOTE    Benjamin Barnett  NWG:[id number] DOB: 06/15/1946 DOA: 07/23/2021 PCP: Eloisa NorthernAmin, Saad, MD    No chief complaint on file.   Brief Narrative:  H/o PEA arrest in 05/2021 resulting in prolonged hospitalization including prolonged stay at an LTAC, he was treated for MRSA PNA and had retroperitoneal hematoma ,he also underwent  peg placement during in 06/2021 during LTAC stay, he was [facility] on [facility]11 but brought back to ED on the same day due to being extremely lethargic and minimally responsive, he was found to have sepsis from acute parotitis and facial cellulitis required intubation, he was extubated on 8/15and transferred to hospitalist service on 8/16  Subjective:  He is drowsy, answers to his name, then went back to sleep, he is tolerating tube feeds, he is on room air, o2 sats 99% + rectal tube with liquid stool + condom cath with clear urine He does not follow commands  Assessment & Plan:   Principal Problem:   Sepsis (HCC) Active Problems:   Acute suppurative parotitis   Acute metabolic encephalopathy   Protein calorie malnutrition (HCC)   GERD without esophagitis   Lactic acidosis   Hypokalemia   Essential hypertension   Pressure injury of skin   Acute hypoxic respiratory failure/sepsis due to right facial cellulitis and acute parotitis -He was seen by ENT, there is no abscess, recommend IV antibiotic treatment -He was intubated and transferred to ICU, successfully extubated on 8/15, transferred to hospitalist service on 8/16 -Is treated with Zyvox and Unasyn since admission, last dose scheduled on 8/17, blood culture no growth -Leukocytosis has normalized , currently he is on room air, maintaining airway  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 -mri brain in 7/14 no acute findings -will get EEG for completeness, daughter denies h/o seizure -Daughter states 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, he then was dealing with bradycardia and cellulitis in his arm, and had PEA arrest in June 2022 -goals of care discussion, I have discussed with daughter if there is no reversible cause found for his encephalopathy, then patient's encephalopathy likely from 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 which he is  less likely to recover from, daughter is advised to think about code status , will consult palliative care for continued goals of care discussion and family support  Dysphagia S/p peg tube placement, with loose stool, d/c miralax   HTN: Blood pressure elevated, increase Norvasc  Hypokalemia/hypomagnesemia Replace K and mag Repeat in the morning  AKI on CKD 2 Creatinine peaked at 1.57 Creatinine improved today is 1 Monitor   MRSA colonization He was treated with MRSA pneumonia in 05/2021 Treated with zyvox this hospitalization as above Also finished decolonization treatment     Body mass index is 28.82 kg/m.Marland Kitchen. Seen by dietician.  I agree with the assessment and plan as outlined below: Nutrition Status: Nutrition Problem: Inadequate oral intake Etiology: inability to eat Signs/Symptoms: NPO status Interventions: Tube feeding, Prostat  .     Unresulted Labs (From admission, onward)     Start     Ordered   07/30/21 0500  Basic metabolic panel  Tomorrow morning,   R       Question:  Specimen collection method  Answer:  Lab=Lab collect   07/29/21 0459   07/30/21 0500  Magnesium  Tomorrow morning,   R       Question:  Specimen collection method  Answer:  Lab=Lab collect  07/29/21 0459   07/30/21 0500  Hemoglobin A1c  Tomorrow morning,   R       Question:  Specimen collection method  Answer:  Lab=Lab collect   07/29/21 1714   07/30/21 0500  Ammonia  Tomorrow morning,   R       Question:  Specimen collection method  Answer:  Lab=Lab collect   07/29/21 1714   07/30/21 0500  TSH  Tomorrow morning,   R       Question:  Specimen collection method  Answer:   Lab=Lab collect   07/29/21 1714   07/28/21 1425  Blood gas, arterial  Once,   R        07/28/21 1424              DVT prophylaxis: enoxaparin (LOVENOX) injection 40 mg Start: 07/24/21 1000   Code Status:full Family Communication: Daughter over the phone Disposition:   Status is: Inpatient   Dispo: The patient is from: SNF              Anticipated d/c is to: SNF, will benefit from palliative care               Anticipated d/c date is: TBD                Consultants:  Critical care ENT  Procedures:  Intubation/extubation   Antimicrobials:    Anti-infectives (From admission, onward)    Start     Dose/Rate Route Frequency Ordered Stop   07/28/21 2200  linezolid (ZYVOX) IVPB 600 mg  Status:  Discontinued        600 mg 300 mL/hr over 60 Minutes Intravenous Every 12 hours 07/28/21 1118 07/28/21 1127   07/28/21 1127  linezolid (ZYVOX) IVPB 600 mg        600 mg 300 mL/hr over 60 Minutes Intravenous Every 12 hours 07/28/21 1127 07/30/21 0959   07/26/21 1000  linezolid (ZYVOX) IVPB 600 mg  Status:  Discontinued        600 mg 300 mL/hr over 60 Minutes Intravenous Every 12 hours 07/26/21 0829 07/28/21 1118   07/24/21 2200  vancomycin (VANCOREADY) IVPB 1250 mg/250 mL  Status:  Discontinued        1,250 mg 166.7 mL/hr over 90 Minutes Intravenous Every 24 hours 07/23/21 2201 07/26/21 0829   07/24/21 0600  ceFEPIme (MAXIPIME) 2 g in sodium chloride 0.9 % 100 mL IVPB  Status:  Discontinued        2 g 200 mL/hr over 30 Minutes Intravenous Every 12 hours 07/23/21 2115 07/24/21 0205   07/24/21 0230  Ampicillin-Sulbactam (UNASYN) 3 g in sodium chloride 0.9 % 100 mL IVPB        3 g 200 mL/hr over 30 Minutes Intravenous Every 8 hours 07/24/21 0211 07/29/21 2359   07/23/21 2130  vancomycin (VANCOREADY) IVPB 1250 mg/250 mL  Status:  Discontinued        1,250 mg 166.7 mL/hr over 90 Minutes Intravenous Every 24 hours 07/23/21 2115 07/23/21 2201   07/23/21 2125  vancomycin  (VANCOREADY) IVPB 2000 mg/400 mL        2,000 mg 200 mL/hr over 120 Minutes Intravenous  Once 07/23/21 1933 07/23/21 2338   07/23/21 1945  ceFEPIme (MAXIPIME) 2 g in sodium chloride 0.9 % 100 mL IVPB        2 g 200 mL/hr over 30 Minutes Intravenous  Once 07/23/21 1933 07/23/21 2032   07/23/21 1945  metroNIDAZOLE (FLAGYL) IVPB 500 mg  500 mg 100 mL/hr over 60 Minutes Intravenous  Once 07/23/21 1933 07/23/21 2102          Objective: Vitals:   07/29/21 0600 07/29/21 0747 07/29/21 0900 07/29/21 1215  BP:  (!) 182/85 (!) 185/93 (!) 163/96  Pulse:  79  91  Resp:  (!) 21  20  Temp: 99.4 F (37.4 C) 99.4 F (37.4 C)  99.5 F (37.5 C)  TempSrc: Axillary Axillary  Axillary  SpO2:  98%  98%  Weight:      Height:        Intake/Output Summary (Last 24 hours) at 07/29/2021 1714 Last data filed at 07/29/2021 1638 Gross per 24 hour  Intake 2148.46 ml  Output 3450 ml  Net -1301.54 ml   Filed Weights   07/23/21 2059 07/24/21 1906  Weight: 96.6 kg 91.1 kg    Examination:  General exam: drowsy, open eyes briefly to his name, does not follow commands, + peg tube, + rectal tube, + condom catheter  Respiratory system: diminished at basis, no wheezing, no rales, no rhonchi. Respiratory effort normal. Cardiovascular system: S1 & S2 heard, RRR.  Gastrointestinal system: + peg tube, Abdomen is nondistended, soft and nontender.  Normal bowel sounds heard. Central nervous system: Drowsy, does not follow command Extremities: No edema Skin: No rashes, lesions or ulcers Psychiatry: Drowsy, does not follow command, currently no agitation    Data Reviewed: I have personally reviewed following labs and imaging studies  CBC: Recent Labs  Lab 07/23/21 1933 07/23/21 2026 07/24/21 0500 07/24/21 2254 07/25/21 0548 07/26/21 0111 07/27/21 0625 07/28/21 0013 07/28/21 1443 07/29/21 0201  WBC 16.0*  --  17.1*  --  13.3* 10.2 9.7 11.6*  --  8.7  NEUTROABS 13.4*  --  14.3*  --   --   --    --   --   --  5.9  HGB 13.9   < > 12.4*   < > 10.0* 9.0* 9.7* 10.2* 10.5* 10.6*  HCT 44.8   < > 40.6   < > 31.4* 29.0* 31.0* 33.1* 31.0* 32.3*  MCV 93.9  --  94.6  --  91.5 92.1 92.3 93.0  --  88.5  PLT 269  --  214  --  165 146* 163 153  --  148*   < > = values in this interval not displayed.    Basic Metabolic Panel: Recent Labs  Lab 07/24/21 0500 07/24/21 2254 07/25/21 0548 07/26/21 0111 07/27/21 0625 07/28/21 0013 07/28/21 1443 07/29/21 0201  NA 142   < > 139 139 139 138 141 136  K 3.3*   < > 3.8 3.8 3.8 3.1* 3.3* 3.2*  CL 103  --  105 107 107 109  --  104  CO2 27  --  23 21* 26 20*  --  23  GLUCOSE 123*  --  165* 156* 101* 105*  --  104*  BUN 24*  --  31* 44* 44* 37*  --  25*  CREATININE 1.33*  --  1.45* 1.57* 1.31* 1.10  --  1.00  CALCIUM 11.0*  --  10.2 9.6 9.2 8.9  --  8.8*  MG 1.7  --  1.8  --   --   --   --  1.6*  PHOS  --   --  2.7 2.8 3.0 1.1*  --   --    < > = values in this interval not displayed.    GFR: Estimated Creatinine Clearance: 75.7 mL/min (by  C-G formula based on SCr of 1 mg/dL).  Liver Function Tests: Recent Labs  Lab 07/23/21 1933 07/24/21 0500 07/25/21 0548 07/26/21 0111 07/27/21 0625 07/28/21 0013  AST 26 21  --   --   --   --   ALT 17 17  --   --   --   --   ALKPHOS 70 66  --   --   --   --   BILITOT 1.0 1.1  --   --   --   --   PROT 8.5* 7.4  --   --   --   --   ALBUMIN 3.5 2.9* 2.2* 2.1* 2.4* 2.5*    CBG: Recent Labs  Lab 07/29/21 0015 07/29/21 0438 07/29/21 0744 07/29/21 1133 07/29/21 1633  GLUCAP 130* 111* 117* 143* 114*     Recent Results (from the past 240 hour(s))  SARS CORONAVIRUS 2 (TAT 6-24 HRS) Nasopharyngeal Nasopharyngeal Swab     Status: None   Collection Time: 07/22/21  1:53 PM   Specimen: Nasopharyngeal Swab  Result Value Ref Range Status   SARS Coronavirus 2 NEGATIVE NEGATIVE Final    Comment: (NOTE) SARS-CoV-2 target nucleic acids are NOT DETECTED.  The SARS-CoV-2 RNA is generally detectable in  upper and lower respiratory specimens during the acute phase of infection. Negative results do not preclude SARS-CoV-2 infection, do not rule out co-infections with other pathogens, and should not be used as the sole basis for treatment or other patient management decisions. Negative results must be combined with clinical observations, patient history, and epidemiological information. The expected result is Negative.  Fact Sheet for Patients: HairSlick.no  Fact Sheet for Healthcare Providers: quierodirigir.com  This test is not yet approved or cleared by the [place] FDA and  has been authorized for detection and/or diagnosis of SARS-CoV-2 by FDA under an Emergency Use Authorization (EUA). This EUA will remain  in effect (meaning this test can be used) for the duration of the COVID-19 declaration under Se ction 564(b)(1) of the Act, 21 U.S.C. section 360bbb-3(b)(1), unless the authorization is terminated or revoked sooner.  Performed at [facility], [address] [place], [place] [address]   Resp Panel by RT-PCR (Flu A&B, Covid) Nasopharyngeal Swab     Status: None   Collection Time: 07/23/21  7:33 PM   Specimen: Nasopharyngeal Swab; Nasopharyngeal(NP) swabs in vial transport medium  Result Value Ref Range Status   SARS Coronavirus 2 by RT PCR NEGATIVE NEGATIVE Final    Comment: (NOTE) SARS-CoV-2 target nucleic acids are NOT DETECTED.  The SARS-CoV-2 RNA is generally detectable in upper respiratory specimens during the acute phase of infection. The lowest concentration of SARS-CoV-2 viral copies this assay can detect is 138 copies/mL. A negative result does not preclude SARS-Cov-2 infection and should not be used as the sole basis for treatment or other patient management decisions. A negative result may occur with  improper specimen collection/handling, submission of specimen other than nasopharyngeal swab,  presence of viral mutation(s) within the areas targeted by this assay, and inadequate number of viral copies(<138 copies/mL). A negative result must be combined with clinical observations, patient history, and epidemiological information. The expected result is Negative.  Fact Sheet for Patients:  BloggerCourse.com  Fact Sheet for Healthcare Providers:  SeriousBroker.it  This test is no t yet approved or cleared by the [place] FDA and  has been authorized for detection and/or diagnosis of SARS-CoV-2 by FDA under an Emergency Use Authorization (EUA). This  EUA will remain  in effect (meaning this test can be used) for the duration of the COVID-19 declaration under Section 564(b)(1) of the Act, 21 U.S.C.section 360bbb-3(b)(1), unless the authorization is terminated  or revoked sooner.       Influenza A by PCR NEGATIVE NEGATIVE Final   Influenza B by PCR NEGATIVE NEGATIVE Final    Comment: (NOTE) The Xpert Xpress SARS-CoV-2/FLU/RSV plus assay is intended as an aid in the diagnosis of influenza from Nasopharyngeal swab specimens and should not be used as a sole basis for treatment. Nasal washings and aspirates are unacceptable for Xpert Xpress SARS-CoV-2/FLU/RSV testing.  Fact Sheet for Patients: BloggerCourse.com  Fact Sheet for Healthcare Providers: SeriousBroker.it  This test is not yet approved or cleared by the [place] FDA and has been authorized for detection and/or diagnosis of SARS-CoV-2 by FDA under an Emergency Use Authorization (EUA). This EUA will remain in effect (meaning this test can be used) for the duration of the COVID-19 declaration under Section 564(b)(1) of the Act, 21 U.S.C. section 360bbb-3(b)(1), unless the authorization is terminated or revoked.  Performed at [facility], [address] [place], [place] [address]   Blood Culture  (routine x 2)     Status: None   Collection Time: 07/23/21  7:45 PM   Specimen: BLOOD  Result Value Ref Range Status   Specimen Description BLOOD RIGHT ARM  Final   Special Requests   Final    BOTTLES DRAWN AEROBIC AND ANAEROBIC Blood Culture results may not be optimal due to an inadequate volume of blood received in culture bottles   Culture   Final    NO GROWTH 5 DAYS Performed at [facility], [address], [place], [place] [address]    Report Status 07/28/2021 FINAL  Final  Blood Culture (routine x 2)     Status: None   Collection Time: 07/23/21  7:50 PM   Specimen: BLOOD  Result Value Ref Range Status   Specimen Description BLOOD RIGHT FOREARM  Final   Special Requests   Final    BOTTLES DRAWN AEROBIC AND ANAEROBIC Blood Culture results may not be optimal due to an inadequate volume of blood received in culture bottles   Culture   Final    NO GROWTH 5 DAYS Performed at [facility], [address] [place], [place] [address]    Report Status 07/28/2021 FINAL  Final  Urine Culture     Status: None   Collection Time: 07/23/21 10:34 PM   Specimen: In/Out Cath Urine  Result Value Ref Range Status   Specimen Description IN/OUT CATH URINE  Final   Special Requests NONE  Final   Culture   Final    NO GROWTH Performed at [facility], [address] [place], [place] [address]    Report Status 07/25/2021 FINAL  Final  MRSA Next Gen by PCR, Nasal     Status: Abnormal   Collection Time: 07/24/21  7:04 PM   Specimen: Nasal Mucosa; Nasal Swab  Result Value Ref Range Status   MRSA by PCR Next Gen DETECTED (A) NOT DETECTED Final    Comment: RESULT CALLED TO, READ BACK BY AND VERIFIED WITH: A,PUCKETT  07/24/21 EB (NOTE) The GeneXpert MRSA Assay (FDA approved for NASAL specimens only), is one component of a comprehensive MRSA colonization surveillance program. It is not intended to diagnose MRSA infection nor to guide or monitor treatment for MRSA  infections. Test performance is not  FDA approved in patients less than 40 years old. Performed at [facility], [address] [place], [place] [address]          Radiology Studies: No results found.      Scheduled Meds:  amantadine  100 mg Per Tube BID   [START ON 07/30/2021] amLODipine  10 mg Per Tube Daily   chlorhexidine  15 mL Mouth Rinse BID   Chlorhexidine Gluconate Cloth  6 each Topical Q0600   ciprofloxacin  1 drop Both Eyes TID   enoxaparin (LOVENOX) injection  40 mg Subcutaneous Daily   feeding supplement (PROSource TF)  90 mL Per Tube BID   free water  200 mL Per Tube Q8H   insulin aspart  0-9 Units Subcutaneous Q4H   mouth rinse  15 mL Mouth Rinse q12n4p   metoCLOPramide  10 mg Per Tube TID AC & HS   mirtazapine  7.5 mg Per Tube QHS   mupirocin ointment  1 application Nasal BID   pantoprazole sodium  40 mg Per Tube Daily   polyethylene glycol  17 g Per Tube Daily   potassium chloride  20 mEq Oral Q2H   QUEtiapine  50 mg Per Tube QHS   sertraline  50 mg Per Tube Daily   Zinc Oxide   Topical BID   Continuous Infusions:  sodium chloride Stopped (07/28/21 2105)   ampicillin-sulbactam (UNASYN) IV 3 g (07/29/21 0956)   feeding supplement (NEPRO CARB STEADY) 50 mL/hr at 07/29/21 0600   linezolid (ZYVOX) IV 600 mg (07/29/21 1002)   magnesium sulfate bolus IVPB 2 g (07/29/21 1625)     LOS: 6 days   Time spent: Greater than 50% of this time was spent in counseling, explanation of diagnosis, planning of further management, and coordination of care.   Voice Recognition Reubin Milan dictation system was used to create this note, attempts have been made to correct errors. Please contact the author with questions and/or clarifications.   Albertine Grates, MD PhD FACP [facility]  Available via Epic secure chat 7am-7pm for nonurgent issues Please page for urgent issues To page the attending provider between 7A-7P or the covering provider during after  hours 7P-7A, please log into the web site [email or web address] and access using universal [place] password for that web site. If you do not have the password, please call the hospital operator.    07/29/2021, 5:14 PM

## 2021-07-30 DIAGNOSIS — R4182 Altered mental status, unspecified: Secondary | ICD-10-CM

## 2021-07-30 LAB — MAGNESIUM: Magnesium: 2 mg/dL (ref 1.7–2.4)

## 2021-07-30 LAB — GLUCOSE, CAPILLARY
Glucose-Capillary: 102 mg/dL — ABNORMAL HIGH (ref 70–99)
Glucose-Capillary: 103 mg/dL — ABNORMAL HIGH (ref 70–99)
Glucose-Capillary: 122 mg/dL — ABNORMAL HIGH (ref 70–99)
Glucose-Capillary: 110 mg/dL — ABNORMAL HIGH (ref 70–99)
Glucose-Capillary: 106 mg/dL — ABNORMAL HIGH (ref 70–99)

## 2021-07-30 LAB — BASIC METABOLIC PANEL
Anion gap: 9 (ref 5–15)
BUN: 19 mg/dL (ref 8–23)
CO2: 24 mmol/L (ref 22–32)
Calcium: 9.4 mg/dL (ref 8.9–10.3)
Chloride: 99 mmol/L (ref 98–111)
Creatinine, Ser: 0.96 mg/dL (ref 0.61–1.24)
GFR, Estimated: >60 mL/min (ref >60)
Glucose, Bld: 99 mg/dL (ref 70–99)
Potassium: 3.9 mmol/L (ref 3.5–5.1)
Sodium: 132 mmol/L — ABNORMAL LOW (ref 135–145)

## 2021-07-30 LAB — TSH: TSH: 3.738 u[IU]/mL (ref 0.350–4.500)

## 2021-07-30 LAB — AMMONIA: Ammonia: 22 umol/L (ref 9–35)

## 2021-07-30 LAB — HEMOGLOBIN A1C
Hgb A1c MFr Bld: 4.7 % — ABNORMAL LOW (ref 4.8–5.6)
Mean Plasma Glucose: 88.19 mg/dL

## 2021-07-30 MED ORDER — METOPROLOL TARTRATE 12.5 MG HALF TABLET
12.5000 mg | ORAL_TABLET | Freq: Two times a day (BID) | ORAL | Status: DC
  Administered 2021-07-30 – 2021-08-02 (×6): 12.5 mg
  Filled 2021-07-30 (×7): qty 1

## 2021-07-30 NOTE — Progress Notes (Signed)
PROGRESS NOTE    Benjamin CarlsWayne Paul Lague  ZOX:[id number] DOB: 04-11-1946 DOA: 07/23/2021 PCP: Eloisa NorthernAmin, Saad, MD    No chief complaint on file.   Brief Narrative:  H/o PEA arrest in 05/2021 resulting in prolonged hospitalization including prolonged stay at an LTAC, he was treated for MRSA PNA and had retroperitoneal hematoma ,he also underwent  peg placement during in 06/2021 during LTAC stay, he was [facility] on 8/11 but brought back to ED on the same day due to being extremely lethargic and minimally responsive, he was found to have sepsis from acute parotitis and facial cellulitis required intubation, he was extubated on 8/15and transferred to hospitalist service on 8/16  Subjective:  No significant interval changes, blood pressure elevated He is drowsy, answers to his name, then went back to sleep, he is tolerating tube feeds, he is on room air, o2 sats 99% + rectal tube with liquid stool + condom cath with clear urine He does not follow commands  Assessment & Plan:   Principal Problem:   Sepsis (HCC) Active Problems:   Acute suppurative parotitis   Acute metabolic encephalopathy   Protein calorie malnutrition (HCC)   GERD without esophagitis   Lactic acidosis   Hypokalemia   Essential hypertension   Pressure injury of skin   Acute hypoxic respiratory failure/sepsis due to right facial cellulitis and acute parotitis -He was seen by ENT, there is no abscess, recommend IV antibiotic treatment -He was intubated and transferred to ICU, successfully extubated on 8/15, transferred to hospitalist service on 8/16 -Is treated with Zyvox and Unasyn since admission, last dose scheduled on 8/17, blood culture no growth -Leukocytosis has normalized , currently he is on room air, maintaining airway  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 -mri brain in 7/14 no acute findings - EEG no seizure spikes -Daughter states patient was independent and 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, he then was dealing with bradycardia and cellulitis in his arm, and had PEA arrest in June 2022 -goals of care discussion, I have discussed with daughter if there is no reversible cause found for his encephalopathy, then patient's encephalopathy likely from 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 which he is  less likely to recover from, daughter is advised to think about code status , palliative care  consult placed for continued goals of care discussion and family support  Dysphagia S/p peg tube placement, with loose stool, d/c miralax   HTN: Blood pressure elevated,  Continue Norvasc, restart lopressor Continue titrate blood pressure meds  Hypokalemia/hypomagnesemia Replace K and mag Repeat in the morning  AKI on CKD 2 Creatinine peaked at 1.57 Creatinine improved , today cr is 0.96 on 8/18 Monitor   MRSA colonization He was treated with MRSA pneumonia in 05/2021 Treated with zyvox this hospitalization as above Also finished decolonization treatment     Body mass index is 28.82 kg/m.Marland Kitchen. Seen by dietician.  I agree with the assessment and plan as outlined below: Nutrition Status: Nutrition Problem: Inadequate oral intake Etiology: inability to eat Signs/Symptoms: NPO status Interventions: Tube feeding, Prostat  .     Unresulted Labs (From admission, onward)     Start     Ordered   07/28/21 1425  Blood gas, arterial  Once,   R        07/28/21 1424              DVT prophylaxis: enoxaparin (LOVENOX) injection 40 mg Start: 07/24/21 1000  Code Status:full Family Communication: Daughter over the phone on 8/17 Disposition:   Status is: Inpatient   Dispo: The patient is from: SNF              Anticipated d/c is to: SNF, will benefit from palliative care               Anticipated d/c date is: TBD, needs palliative care consult , titrate blood pressure meds                Consultants:  Critical care ENT  Procedures:   Intubation/extubation   Antimicrobials:    Anti-infectives (From admission, onward)    Start     Dose/Rate Route Frequency Ordered Stop   07/28/21 2200  linezolid (ZYVOX) IVPB 600 mg  Status:  Discontinued        600 mg 300 mL/hr over 60 Minutes Intravenous Every 12 hours 07/28/21 1118 07/28/21 1127   07/28/21 1127  linezolid (ZYVOX) IVPB 600 mg        600 mg 300 mL/hr over 60 Minutes Intravenous Every 12 hours 07/28/21 1127 07/30/21 0729   07/26/21 1000  linezolid (ZYVOX) IVPB 600 mg  Status:  Discontinued        600 mg 300 mL/hr over 60 Minutes Intravenous Every 12 hours 07/26/21 0829 07/28/21 1118   07/24/21 2200  vancomycin (VANCOREADY) IVPB 1250 mg/250 mL  Status:  Discontinued        1,250 mg 166.7 mL/hr over 90 Minutes Intravenous Every 24 hours 07/23/21 2201 07/26/21 0829   07/24/21 0600  ceFEPIme (MAXIPIME) 2 g in sodium chloride 0.9 % 100 mL IVPB  Status:  Discontinued        2 g 200 mL/hr over 30 Minutes Intravenous Every 12 hours 07/23/21 2115 07/24/21 0205   07/24/21 0230  Ampicillin-Sulbactam (UNASYN) 3 g in sodium chloride 0.9 % 100 mL IVPB        3 g 200 mL/hr over 30 Minutes Intravenous Every 8 hours 07/24/21 0211 07/30/21 0729   07/23/21 2130  vancomycin (VANCOREADY) IVPB 1250 mg/250 mL  Status:  Discontinued        1,250 mg 166.7 mL/hr over 90 Minutes Intravenous Every 24 hours 07/23/21 2115 07/23/21 2201   07/23/21 2125  vancomycin (VANCOREADY) IVPB 2000 mg/400 mL        2,000 mg 200 mL/hr over 120 Minutes Intravenous  Once 07/23/21 1933 07/23/21 2338   07/23/21 1945  ceFEPIme (MAXIPIME) 2 g in sodium chloride 0.9 % 100 mL IVPB        2 g 200 mL/hr over 30 Minutes Intravenous  Once 07/23/21 1933 07/23/21 2032   07/23/21 1945  metroNIDAZOLE (FLAGYL) IVPB 500 mg        500 mg 100 mL/hr over 60 Minutes Intravenous  Once 07/23/21 1933 07/23/21 2102          Objective: Vitals:   07/29/21 2335 07/30/21 0332 07/30/21 0700 07/30/21 0900  BP: (!) 141/83 (!)  155/90 (!) 168/83 (!) 169/91  Pulse: 91 83 82 88  Resp: (!) 23 (!) 24 (!) 21 (!) 22  Temp: 98.9 F (37.2 C) 98.7 F (37.1 C) 98.5 F (36.9 C) 98.7 F (37.1 C)  TempSrc: Axillary Axillary Axillary Axillary  SpO2: 96% 96%  96%  Weight:      Height:        Intake/Output Summary (Last 24 hours) at 07/30/2021 1609 Last data filed at 07/30/2021 1300 Gross per 24 hour  Intake 2360 ml  Output 3325 ml  Net -965 ml   Filed Weights   07/23/21 2059 07/24/21 1906  Weight: 96.6 kg 91.1 kg    Examination:  General exam: drowsy, open eyes briefly to his name, does not follow commands, + peg tube, + rectal tube, + condom catheter  Respiratory system: diminished at basis, no wheezing, no rales, no rhonchi. Respiratory effort normal. Cardiovascular system: S1 & S2 heard, RRR.  Gastrointestinal system: + peg tube, Abdomen is nondistended, soft and nontender.  Normal bowel sounds heard. Central nervous system: Drowsy, does not follow command Extremities: No edema Skin: No rashes, lesions or ulcers Psychiatry: Drowsy, does not follow command, currently no agitation    Data Reviewed: I have personally reviewed following labs and imaging studies  CBC: Recent Labs  Lab 07/23/21 1933 07/23/21 2026 07/24/21 0500 07/24/21 2254 07/25/21 0548 07/26/21 0111 07/27/21 0625 07/28/21 0013 07/28/21 1443 07/29/21 0201  WBC 16.0*  --  17.1*  --  13.3* 10.2 9.7 11.6*  --  8.7  NEUTROABS 13.4*  --  14.3*  --   --   --   --   --   --  5.9  HGB 13.9   < > 12.4*   < > 10.0* 9.0* 9.7* 10.2* 10.5* 10.6*  HCT 44.8   < > 40.6   < > 31.4* 29.0* 31.0* 33.1* 31.0* 32.3*  MCV 93.9  --  94.6  --  91.5 92.1 92.3 93.0  --  88.5  PLT 269  --  214  --  165 146* 163 153  --  148*   < > = values in this interval not displayed.    Basic Metabolic Panel: Recent Labs  Lab 07/24/21 0500 07/24/21 2254 07/25/21 0548 07/26/21 0111 07/27/21 0625 07/28/21 0013 07/28/21 1443 07/29/21 0201 07/30/21 0337  NA 142    < > 139 139 139 138 141 136 132*  K 3.3*   < > 3.8 3.8 3.8 3.1* 3.3* 3.2* 3.9  CL 103  --  105 107 107 109  --  104 99  CO2 27  --  23 21* 26 20*  --  23 24  GLUCOSE 123*  --  165* 156* 101* 105*  --  104* 99  BUN 24*  --  31* 44* 44* 37*  --  25* 19  CREATININE 1.33*  --  1.45* 1.57* 1.31* 1.10  --  1.00 0.96  CALCIUM 11.0*  --  10.2 9.6 9.2 8.9  --  8.8* 9.4  MG 1.7  --  1.8  --   --   --   --  1.6* 2.0  PHOS  --   --  2.7 2.8 3.0 1.1*  --   --   --    < > = values in this interval not displayed.    GFR: Estimated Creatinine Clearance: 78.9 mL/min (by C-G formula based on SCr of 0.96 mg/dL).  Liver Function Tests: Recent Labs  Lab 07/23/21 1933 07/24/21 0500 07/25/21 0548 07/26/21 0111 07/27/21 0625 07/28/21 0013  AST 26 21  --   --   --   --   ALT 17 17  --   --   --   --   ALKPHOS 70 66  --   --   --   --   BILITOT 1.0 1.1  --   --   --   --   PROT 8.5* 7.4  --   --   --   --  ALBUMIN 3.5 2.9* 2.2* 2.1* 2.4* 2.5*    CBG: Recent Labs  Lab 07/29/21 2007 07/29/21 2345 07/30/21 0331 07/30/21 0745 07/30/21 1139  GLUCAP 114* 112* 102* 103* 122*     Recent Results (from the past 240 hour(s))  SARS CORONAVIRUS 2 (TAT 6-24 HRS) Nasopharyngeal Nasopharyngeal Swab     Status: None   Collection Time: 07/22/21  1:53 PM   Specimen: Nasopharyngeal Swab  Result Value Ref Range Status   SARS Coronavirus 2 NEGATIVE NEGATIVE Final    Comment: (NOTE) SARS-CoV-2 target nucleic acids are NOT DETECTED.  The SARS-CoV-2 RNA is generally detectable in upper and lower respiratory specimens during the acute phase of infection. Negative results do not preclude SARS-CoV-2 infection, do not rule out co-infections with other pathogens, and should not be used as the sole basis for treatment or other patient management decisions. Negative results must be combined with clinical observations, patient history, and epidemiological information. The expected result is Negative.  Fact  Sheet for Patients: HairSlick.no  Fact Sheet for Healthcare Providers: quierodirigir.com  This test is not yet approved or cleared by the [place] FDA and  has been authorized for detection and/or diagnosis of SARS-CoV-2 by FDA under an Emergency Use Authorization (EUA). This EUA will remain  in effect (meaning this test can be used) for the duration of the COVID-19 declaration under Se ction 564(b)(1) of the Act, 21 U.S.C. section 360bbb-3(b)(1), unless the authorization is terminated or revoked sooner.  Performed at [facility], [address], [place], [place] [address]   Resp Panel by RT-PCR (Flu A&B, Covid) Nasopharyngeal Swab     Status: None   Collection Time: 07/23/21  7:33 PM   Specimen: Nasopharyngeal Swab; Nasopharyngeal(NP) swabs in vial transport medium  Result Value Ref Range Status   SARS Coronavirus 2 by RT PCR NEGATIVE NEGATIVE Final    Comment: (NOTE) SARS-CoV-2 target nucleic acids are NOT DETECTED.  The SARS-CoV-2 RNA is generally detectable in upper respiratory specimens during the acute phase of infection. The lowest concentration of SARS-CoV-2 viral copies this assay can detect is 138 copies/mL. A negative result does not preclude SARS-Cov-2 infection and should not be used as the sole basis for treatment or other patient management decisions. A negative result may occur with  improper specimen collection/handling, submission of specimen other than nasopharyngeal swab, presence of viral mutation(s) within the areas targeted by this assay, and inadequate number of viral copies(<138 copies/mL). A negative result must be combined with clinical observations, patient history, and epidemiological information. The expected result is Negative.  Fact Sheet for Patients:  BloggerCourse.com  Fact Sheet for Healthcare Providers:   SeriousBroker.it  This test is no t yet approved or cleared by the [place] FDA and  has been authorized for detection and/or diagnosis of SARS-CoV-2 by FDA under an Emergency Use Authorization (EUA). This EUA will remain  in effect (meaning this test can be used) for the duration of the COVID-19 declaration under Section 564(b)(1) of the Act, 21 U.S.C.section 360bbb-3(b)(1), unless the authorization is terminated  or revoked sooner.       Influenza A by PCR NEGATIVE NEGATIVE Final   Influenza B by PCR NEGATIVE NEGATIVE Final    Comment: (NOTE) The Xpert Xpress SARS-CoV-2/FLU/RSV plus assay is intended as an aid in the diagnosis of influenza from Nasopharyngeal swab specimens and should not be used as a sole basis for treatment. Nasal washings and aspirates are unacceptable for Xpert Xpress SARS-CoV-2/FLU/RSV testing.  Fact Sheet  for Patients: BloggerCourse.com  Fact Sheet for Healthcare Providers: SeriousBroker.it  This test is not yet approved or cleared by the [place] FDA and has been authorized for detection and/or diagnosis of SARS-CoV-2 by FDA under an Emergency Use Authorization (EUA). This EUA will remain in effect (meaning this test can be used) for the duration of the COVID-19 declaration under Section 564(b)(1) of the Act, 21 U.S.C. section 360bbb-3(b)(1), unless the authorization is terminated or revoked.  Performed at [facility], [address], [place], [place] [address]   Blood Culture (routine x 2)     Status: None   Collection Time: 07/23/21  7:45 PM   Specimen: BLOOD  Result Value Ref Range Status   Specimen Description BLOOD RIGHT ARM  Final   Special Requests   Final    BOTTLES DRAWN AEROBIC AND ANAEROBIC Blood Culture results may not be optimal due to an inadequate volume of blood received in culture bottles   Culture   Final    NO GROWTH 5 DAYS Performed  at [facility], [address] [place], [place] [address]    Report Status 07/28/2021 FINAL  Final  Blood Culture (routine x 2)     Status: None   Collection Time: 07/23/21  7:50 PM   Specimen: BLOOD  Result Value Ref Range Status   Specimen Description BLOOD RIGHT FOREARM  Final   Special Requests   Final    BOTTLES DRAWN AEROBIC AND ANAEROBIC Blood Culture results may not be optimal due to an inadequate volume of blood received in culture bottles   Culture   Final    NO GROWTH 5 DAYS Performed at [facility], [address] [place], [place] [address]    Report Status 07/28/2021 FINAL  Final  Urine Culture     Status: None   Collection Time: 07/23/21 10:34 PM   Specimen: In/Out Cath Urine  Result Value Ref Range Status   Specimen Description IN/OUT CATH URINE  Final   Special Requests NONE  Final   Culture   Final    NO GROWTH Performed at [facility], [address], [place], [place] [address]    Report Status 07/25/2021 FINAL  Final  MRSA Next Gen by PCR, Nasal     Status: Abnormal   Collection Time: 07/24/21  7:04 PM   Specimen: Nasal Mucosa; Nasal Swab  Result Value Ref Range Status   MRSA by PCR Next Gen DETECTED (A) NOT DETECTED Final    Comment: RESULT CALLED TO, READ BACK BY AND VERIFIED WITH: A,PUCKETT @2254  07/24/21 EB (NOTE) The GeneXpert MRSA Assay (FDA approved for NASAL specimens only), is one component of a comprehensive MRSA colonization surveillance program. It is not intended to diagnose MRSA infection nor to guide or monitor treatment for MRSA infections. Test performance is not FDA approved in patients less than 52 years old. Performed at [facility], [address] [place], [place]          Radiology Studies: EEG adult  Result Date: 08/25/2021 08/01/2021, MD     08/25/2021  8:52 AM Patient Name: Kadarious Dikes Boissonneault MRN: Benjamin Barnett Epilepsy Attending: [id number] Referring Physician/Provider: Dr  Charlsie Quest Date: 07/29/2021 Duration: 23.56 mins Patient history: 75 year old male with altered mental status.  EEG to evaluate for seizures. Level of alertness: Awake AEDs during EEG study: None Technical aspects: This EEG study was done with scalp electrodes positioned according to  the 10-20 International system of electrode placement. Electrical activity was acquired at a sampling rate of  and reviewed with a high frequency filter of  and a low frequency filter of . EEG data were recorded continuously and digitally stored. Description: EEG showed continuous generalized 5 to 6 Hz theta as well as intermittent generalized 2-3Hz  delta slowing. Hyperventilation and photic stimulation were not performed.   ABNORMALITY - Continuous slow, generalized IMPRESSION: This study is suggestive of moderate diffuse encephalopathy, nonspecific etiology. No seizures or epileptiform discharges were seen throughout the recording. Priyanka Annabelle Harman        Scheduled Meds:  amantadine  100 mg Per Tube BID   amLODipine  10 mg Per Tube Daily   chlorhexidine  15 mL Mouth Rinse BID   ciprofloxacin  1 drop Both Eyes TID   enoxaparin (LOVENOX) injection  40 mg Subcutaneous Daily   feeding supplement (PROSource TF)  90 mL Per Tube BID   free water  200 mL Per Tube Q8H   insulin aspart  0-9 Units Subcutaneous Q4H   mouth rinse  15 mL Mouth Rinse q12n4p   metoCLOPramide  10 mg Per Tube TID AC & HS   metoprolol tartrate  12.5 mg Per Tube BID   mirtazapine  7.5 mg Per Tube QHS   pantoprazole sodium  40 mg Per Tube Daily   QUEtiapine  50 mg Per Tube QHS   sertraline  50 mg Per Tube Daily   Zinc Oxide   Topical BID   Continuous Infusions:  sodium chloride Stopped (07/28/21 2105)   feeding supplement (NEPRO CARB STEADY) 50 mL/hr at 07/30/21 0600     LOS: 7 days   Time spent: Greater than 50% of this time was spent in counseling, explanation of diagnosis, planning of further management, and coordination  of care.   Voice Recognition Reubin Milan dictation system was used to create this note, attempts have been made to correct errors. Please contact the author with questions and/or clarifications.   Albertine Grates, MD PhD FACP [facility]  Available via Epic secure chat 7am-7pm for nonurgent issues Please page for urgent issues To page the attending provider between 7A-7P or the covering provider during after hours 7P-7A, please log into the web site [email or web address] and access using universal [place] password for that web site. If you do not have the password, please call the hospital operator.    07/30/2021, 4:09 PM

## 2021-07-30 NOTE — Procedures (Addendum)
Patient Name: Benjamin Barnett  MRN: [id number]  Epilepsy Attending: Charlsie Quest  Referring Physician/Provider: Dr Albertine Grates Date: 07/29/2021 Duration: 23.56 mins  Patient history: 75 year old male with altered mental status.  EEG to evaluate for seizures.  Level of alertness: Awake  AEDs during EEG study: None  Technical aspects: This EEG study was done with scalp electrodes positioned according to the 10-20 International system of electrode placement. Electrical activity was acquired at a sampling rate of 500Hz  and reviewed with a high frequency filter of 70Hz  and a low frequency filter of 1Hz . EEG data were recorded continuously and digitally stored.   Description: EEG showed continuous generalized 5 to 6 Hz theta as well as intermittent generalized 2-3Hz  delta slowing. Hyperventilation and photic stimulation were not performed.     ABNORMALITY - Continuous slow, generalized  IMPRESSION: This study is suggestive of moderate diffuse encephalopathy, nonspecific etiology. No seizures or epileptiform discharges were seen throughout the recording.  Benjamin Barnett 

## 2021-07-30 NOTE — Care Management Important Message (Signed)
Important Message  Patient Details  Name: Benjamin Barnett MRN: [id number] Date of Birth: 02/22/1946   Medicare Important Message Given:  Yes     Renie Ora 07/30/2021, 12:04 PM

## 2021-07-30 NOTE — TOC Progression Note (Signed)
Transition of Care [facility]) - Progression Note    Patient Details  Name: Benjamin Barnett MRN: [id number] Date of Birth: May 05, 1946  Transition of Care [facility]) CM/SW Contact  Terrial Rhodes, LCSWA Phone Number: 07/30/2021, 12:58 PM  Clinical Narrative:     Patient has SN[facility] bed at [facility] when medically ready CSW will continue to follow and assist with dc planning needs.   Expected Discharge Plan: Skilled Nursing Facility Barriers to Discharge: Continued Medical Work up  Expected Discharge Plan and Services Expected Discharge Plan: Skilled Nursing Facility In-house Referral: Clinical Social Work     Living arrangements for the past 2 months: Skilled Nursing Facility                                       Social Determinants of Health (SDOH) Interventions    Readmission Risk Interventions No flowsheet data found.

## 2021-07-31 DIAGNOSIS — Z7189 Other specified counseling: Secondary | ICD-10-CM

## 2021-07-31 DIAGNOSIS — Z66 Do not resuscitate: Secondary | ICD-10-CM

## 2021-07-31 DIAGNOSIS — Z515 Encounter for palliative care: Secondary | ICD-10-CM

## 2021-07-31 LAB — GLUCOSE, CAPILLARY
Glucose-Capillary: 106 mg/dL — ABNORMAL HIGH (ref 70–99)
Glucose-Capillary: 108 mg/dL — ABNORMAL HIGH (ref 70–99)
Glucose-Capillary: 109 mg/dL — ABNORMAL HIGH (ref 70–99)
Glucose-Capillary: 109 mg/dL — ABNORMAL HIGH (ref 70–99)
Glucose-Capillary: 112 mg/dL — ABNORMAL HIGH (ref 70–99)
Glucose-Capillary: 122 mg/dL — ABNORMAL HIGH (ref 70–99)

## 2021-07-31 NOTE — Progress Notes (Addendum)
PROGRESS NOTE    Benjamin Barnett  ZOX:[id number] DOB: [birth year] DOA: 07/23/2021 PCP: Eloisa Northern, MD    No chief complaint on file.   Brief Narrative:  H/o PEA arrest in 05/2021 resulting in prolonged hospitalization including prolonged stay at an LTAC, he was treated for MRSA PNA and had retroperitoneal hematoma ,he also underwent  peg placement during in 06/2021 during LTAC stay, [facility]11 but brought back to ED on the same day due to being extremely lethargic and minimally responsive, he was found to have sepsis from acute parotitis and facial cellulitis required intubation, he was extubated on 8/15and transferred to hospitalist service on 8/16  Subjective:  No significant interval changes, blood pressure has improved He open his eyes spontaneously, he answers to his name , able to talk a few word indistinctly , appears to know his birthday , not oriented to time or place ,does not follow command No agitation currently He is maintaining his airway , no hypoxia on room air Tolerating tube feeds + rectal tube with liquid stool + condom cath with clear urine   Assessment & Plan:   Principal Problem:   Sepsis (HCC) Active Problems:   Acute suppurative parotitis   Acute metabolic encephalopathy   Protein calorie malnutrition (HCC)   GERD without esophagitis   Lactic acidosis   Hypokalemia   Essential hypertension   Pressure injury of skin   Acute hypoxic respiratory failure/sepsis due to right facial cellulitis and acute parotitis -He was seen by ENT, there is no abscess, recommend IV antibiotic treatment -He was intubated and transferred to ICU, successfully extubated on 8/15, transferred to hospitalist service on 8/16 -Is treated with Zyvox and Unasyn since admission, last dose scheduled on 8/17, blood culture no growth -Leukocytosis has normalized , currently he is on room air, maintaining airway  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 -mri brain in 7/14 no acute findings - EEG no seizure spikes -Daughter states 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, he then was dealing with bradycardia and cellulitis in his arm, and had PEA arrest in June 2022 -goals of care discussion, I have discussed with daughter if there is no reversible cause found for his encephalopathy, then patient's encephalopathy likely from 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 which he is  less likely to recover from, daughter is advised to think about code status , palliative care  consult placed for continued goals of care discussion and family support  Dysphagia S/p peg tube placement, with loose stool, d/c miralax   HTN: Blood pressure improved on  Norvasc and lopressor Continue titrate blood pressure meds as needed  Hypokalemia/hypomagnesemia Replace K and mag Repeat in the morning  AKI on CKD 2 Creatinine peaked at 1.57 Creatinine improved , today cr is 0.96 on 8/18 Monitor   MRSA colonization He was treated with MRSA pneumonia in 05/2021 Treated with zyvox this hospitalization as above Also finished decolonization treatment     Body mass index is 28.82 kg/m.Marland Kitchen Seen by dietician.  I agree with the assessment and plan as outlined below: Nutrition Status: Nutrition Problem: Inadequate oral intake Etiology: inability to eat Signs/Symptoms: NPO status Interventions: Tube feeding, Prostat  .     Unresulted Labs (From admission, onward)    None         DVT prophylaxis: enoxaparin (LOVENOX) injection 40 mg Start: 07/24/21 1000   Code Status:full Family Communication: Daughter over the  phone on 8/17 Disposition:   Status is: Inpatient   Dispo: The patient is from: SNF              Anticipated d/c is to: SNF, will benefit from palliative care               Anticipated d/c date is: TBD, needs goals of care discussion, palliative care consult              Consultants:   Critical care ENT Palliative care  Procedures:  Intubation/extubation   Antimicrobials:    Anti-infectives (From admission, onward)    Start     Dose/Rate Route Frequency Ordered Stop   07/28/21 2200  linezolid (ZYVOX) IVPB 600 mg  Status:  Discontinued        600 mg 300 mL/hr over 60 Minutes Intravenous Every 12 hours 07/28/21 1118 07/28/21 1127   07/28/21 1127  linezolid (ZYVOX) IVPB 600 mg        600 mg 300 mL/hr over 60 Minutes Intravenous Every 12 hours 07/28/21 1127 07/30/21 0729   07/26/21 1000  linezolid (ZYVOX) IVPB 600 mg  Status:  Discontinued        600 mg 300 mL/hr over 60 Minutes Intravenous Every 12 hours 07/26/21 0829 07/28/21 1118   07/24/21 2200  vancomycin (VANCOREADY) IVPB 1250 mg/250 mL  Status:  Discontinued        1,250 mg 166.7 mL/hr over 90 Minutes Intravenous Every 24 hours 07/23/21 2201 07/26/21 0829   07/24/21 0600  ceFEPIme (MAXIPIME) 2 g in sodium chloride 0.9 % 100 mL IVPB  Status:  Discontinued        2 g 200 mL/hr over 30 Minutes Intravenous Every 12 hours 07/23/21 2115 07/24/21 0205   07/24/21 0230  Ampicillin-Sulbactam (UNASYN) 3 g in sodium chloride 0.9 % 100 mL IVPB        3 g 200 mL/hr over 30 Minutes Intravenous Every 8 hours 07/24/21 0211 07/30/21 0729   07/23/21 2130  vancomycin (VANCOREADY) IVPB 1250 mg/250 mL  Status:  Discontinued        1,250 mg 166.7 mL/hr over 90 Minutes Intravenous Every 24 hours 07/23/21 2115 07/23/21 2201   07/23/21 2125  vancomycin (VANCOREADY) IVPB 2000 mg/400 mL        2,000 mg 200 mL/hr over 120 Minutes Intravenous  Once 07/23/21 1933 07/23/21 2338   07/23/21 1945  ceFEPIme (MAXIPIME) 2 g in sodium chloride 0.9 % 100 mL IVPB        2 g 200 mL/hr over 30 Minutes Intravenous  Once 07/23/21 1933 07/23/21 2032   07/23/21 1945  metroNIDAZOLE (FLAGYL) IVPB 500 mg        500 mg 100 mL/hr over 60 Minutes Intravenous  Once 07/23/21 1933 07/23/21 2102          Objective: Vitals:   07/30/21 2358 07/31/21  0441 07/31/21 0721 07/31/21 1200  BP: 140/77 (!) 142/80 (!) 147/89 (!) 150/95  Pulse: 86 82 79 79  Resp: 20 16 14 18   Temp: 99 F (37.2 C) 98.6 F (37 C) 98.5 F (36.9 C) 98.3 F (36.8 C)  TempSrc: Axillary Axillary Axillary Axillary  SpO2: 96% 95% 97% 96%  Weight:      Height:        Intake/Output Summary (Last 24 hours) at 07/31/2021 1519 Last data filed at 07/31/2021 1316 Gross per 24 hour  Intake 890 ml  Output 2500 ml  Net -1610 ml   [employer]ights  07/23/21 2059 07/24/21 1906  Weight: 96.6 kg 91.1 kg    Examination:  General exam: Open eyes spontaneously, does not follow commands, + peg tube, + rectal tube, + condom catheter  Respiratory system: diminished at basis, no wheezing, no rales, no rhonchi. Respiratory effort normal. Cardiovascular system: S1 & S2 heard, RRR, + murmur Gastrointestinal system: + peg tube, Abdomen is nondistended, soft and nontender.  Normal bowel sounds heard. Central nervous system: Open eyes spontaneously, does not follow command Extremities: No edema Skin: No rashes, lesions or ulcers Psychiatry: does not follow command, currently no agitation    Data Reviewed: I have personally reviewed following labs and imaging studies  CBC: Recent Labs  Lab 07/25/21 0548 07/26/21 0111 07/27/21 0625 07/28/21 0013 07/28/21 1443 07/29/21 0201  WBC 13.3* 10.2 9.7 11.6*  --  8.7  NEUTROABS  --   --   --   --   --  5.9  HGB 10.0* 9.0* 9.7* 10.2* 10.5* 10.6*  HCT 31.4* 29.0* 31.0* 33.1* 31.0* 32.3*  MCV 91.5 92.1 92.3 93.0  --  88.5  PLT 165 146* 163 153  --  148*    Basic Metabolic Panel: Recent Labs  Lab 07/25/21 0548 07/26/21 0111 07/27/21 0625 07/28/21 0013 07/28/21 1443 07/29/21 0201 07/30/21 0337  NA 139 139 139 138 141 136 132*  K 3.8 3.8 3.8 3.1* 3.3* 3.2* 3.9  CL 105 107 107 109  --  104 99  CO2 23 21* 26 20*  --  23 24  GLUCOSE 165* 156* 101* 105*  --  104* 99  BUN 31* 44* 44* 37*  --  25* 19  CREATININE 1.45* 1.57*  1.31* 1.10  --  1.00 0.96  CALCIUM 10.2 9.6 9.2 8.9  --  8.8* 9.4  MG 1.8  --   --   --   --  1.6* 2.0  PHOS 2.7 2.8 3.0 1.1*  --   --   --     GFR: Estimated Creatinine Clearance: 78.9 mL/min (by C-G formula based on SCr of 0.96 mg/dL).  Liver Function Tests: Recent Labs  Lab 07/25/21 0548 07/26/21 0111 07/27/21 0625 07/28/21 0013  ALBUMIN 2.2* 2.1* 2.4* 2.5*    CBG: Recent Labs  Lab 07/30/21 2026 07/30/21 2356 07/31/21 0449 07/31/21 0719 07/31/21 1159  GLUCAP 106* 109* 108* 106* 112*     Recent Results (from the past 240 hour(s))  SARS CORONAVIRUS 2 (TAT 6-24 HRS) Nasopharyngeal Nasopharyngeal Swab     Status: None   Collection Time: 07/22/21  1:53 PM   Specimen: Nasopharyngeal Swab  Result Value Ref Range Status   SARS Coronavirus 2 NEGATIVE NEGATIVE Final    Comment: (NOTE) SARS-CoV-2 target nucleic acids are NOT DETECTED.  The SARS-CoV-2 RNA is generally detectable in upper and lower respiratory specimens during the acute phase of infection. Negative results do not preclude SARS-CoV-2 infection, do not rule out co-infections with other pathogens, and should not be used as the sole basis for treatment or other patient management decisions. Negative results must be combined with clinical observations, patient history, and epidemiological information. The expected result is Negative.  Fact Sheet for Patients: HairSlick.no  Fact Sheet for Healthcare Providers: quierodirigir.com  This test is not yet approved or cleared by the [place] FDA and  has been authorized for detection and/or diagnosis of SARS-CoV-2 by FDA under an Emergency Use Authorization (EUA). This EUA will remain  in effect (meaning this test can be used) for the duration of the  COVID-19 declaration under Se ction 564(b)(1) of the Act, 21 U.S.C. section 360bbb-3(b)(1), unless the authorization is terminated or revoked  sooner.  Performed at [facility], [address] [place], [place] [address]   Resp Panel by RT-PCR (Flu A&B, Covid) Nasopharyngeal Swab     Status: None   Collection Time: 07/23/21  7:33 PM   Specimen: Nasopharyngeal Swab; Nasopharyngeal(NP) swabs in vial transport medium  Result Value Ref Range Status   SARS Coronavirus 2 by RT PCR NEGATIVE NEGATIVE Final    Comment: (NOTE) SARS-CoV-2 target nucleic acids are NOT DETECTED.  The SARS-CoV-2 RNA is generally detectable in upper respiratory specimens during the acute phase of infection. The lowest concentration of SARS-CoV-2 viral copies this assay can detect is 138 copies/mL. A negative result does not preclude SARS-Cov-2 infection and should not be used as the sole basis for treatment or other patient management decisions. A negative result may occur with  improper specimen collection/handling, submission of specimen other than nasopharyngeal swab, presence of viral mutation(s) within the areas targeted by this assay, and inadequate number of viral copies(<138 copies/mL). A negative result must be combined with clinical observations, patient history, and epidemiological information. The expected result is Negative.  Fact Sheet for Patients:  BloggerCourse.com  Fact Sheet for Healthcare Providers:  SeriousBroker.it  This test is no t yet approved or cleared by the [place] FDA and  has been authorized for detection and/or diagnosis of SARS-CoV-2 by FDA under an Emergency Use Authorization (EUA). This EUA will remain  in effect (meaning this test can be used) for the duration of the COVID-19 declaration under Section 564(b)(1) of the Act, 21 U.S.C.section 360bbb-3(b)(1), unless the authorization is terminated  or revoked sooner.       Influenza A by PCR NEGATIVE NEGATIVE Final   Influenza B by PCR NEGATIVE NEGATIVE Final    Comment: (NOTE) The Xpert Xpress  SARS-CoV-2/FLU/RSV plus assay is intended as an aid in the diagnosis of influenza from Nasopharyngeal swab specimens and should not be used as a sole basis for treatment. Nasal washings and aspirates are unacceptable for Xpert Xpress SARS-CoV-2/FLU/RSV testing.  Fact Sheet for Patients: BloggerCourse.com  Fact Sheet for Healthcare Providers: SeriousBroker.it  This test is not yet approved or cleared by the [place] FDA and has been authorized for detection and/or diagnosis of SARS-CoV-2 by FDA under an Emergency Use Authorization (EUA). This EUA will remain in effect (meaning this test can be used) for the duration of the COVID-19 declaration under Section 564(b)(1) of the Act, 21 U.S.C. section 360bbb-3(b)(1), unless the authorization is terminated or revoked.  Performed at [facility], [address], [place], [place] [address]   Blood Culture (routine x 2)     Status: None   Collection Time: 07/23/21  7:45 PM   Specimen: BLOOD  Result Value Ref Range Status   Specimen Description BLOOD RIGHT ARM  Final   Special Requests   Final    BOTTLES DRAWN AEROBIC AND ANAEROBIC Blood Culture results may not be optimal due to an inadequate volume of blood received in culture bottles   Culture   Final    NO GROWTH 5 DAYS Performed at [facility], [address] [place], [place] [address]    Report Status 07/28/2021 FINAL  Final  Blood Culture (routine x 2)     Status: None   Collection Time: 07/23/21  7:50 PM   Specimen: BLOOD  Result Value Ref Range Status  Specimen Description BLOOD RIGHT FOREARM  Final   Special Requests   Final    BOTTLES DRAWN AEROBIC AND ANAEROBIC Blood Culture results may not be optimal due to an inadequate volume of blood received in culture bottles   Culture   Final    NO GROWTH 5 DAYS Performed at [facility], [address] [place], [place] [address]    Report Status  07/28/2021 FINAL  Final  Urine Culture     Status: None   Collection Time: 07/23/21 10:34 PM   Specimen: In/Out Cath Urine  Result Value Ref Range Status   Specimen Description IN/OUT CATH URINE  Final   Special Requests NONE  Final   Culture   Final    NO GROWTH Performed at [facility], [address], [place], [place] [address]    Report Status 07/25/2021 FINAL  Final  MRSA Next Gen by PCR, Nasal     Status: Abnormal   Collection Time: 07/24/21  7:04 PM   Specimen: Nasal Mucosa; Nasal Swab  Result Value Ref Range Status   MRSA by PCR Next Gen DETECTED (A) NOT DETECTED Final    Comment: RESULT CALLED TO, READ BACK BY AND VERIFIED WITH: A,PUCKETT @2254  07/24/21 EB (NOTE) The GeneXpert MRSA Assay (FDA approved for NASAL specimens only), is one component of a comprehensive MRSA colonization surveillance program. It is not intended to diagnose MRSA infection nor to guide or monitor treatment for MRSA infections. Test performance is not FDA approved in patients less than 80 years old. Performed at [facility], [address], [place], [place]          Radiology Studies: EEG adult  Result Date: 08/09/21 08/01/2021, MD     08-09-21  8:52 AM Patient Name: Latrell Potempa Pavlicek MRN: Casilda Carls Epilepsy Attending: [id number] Referring Physician/Provider: Dr Charlsie Quest Date: 07/29/2021 Duration: 23.56 mins Patient history: 75 year old male with altered mental status.  EEG to evaluate for seizures. Level of alertness: Awake AEDs during EEG study: None Technical aspects: This EEG study was done with scalp electrodes positioned according to the 10-20 International system of electrode placement. Electrical activity was acquired at a sampling rate of 500Hz  and reviewed with a high frequency filter of 70Hz  and a low frequency filter of 1Hz . EEG data were recorded continuously and digitally stored. Description: EEG showed continuous generalized 5 to 6 Hz theta as  well as intermittent generalized 2-3Hz  delta slowing. Hyperventilation and photic stimulation were not performed.   ABNORMALITY - Continuous slow, generalized IMPRESSION: This study is suggestive of moderate diffuse encephalopathy, nonspecific etiology. No seizures or epileptiform discharges were seen throughout the recording. Priyanka 63        Scheduled Meds:  amantadine  100 mg Per Tube BID   amLODipine  10 mg Per Tube Daily   chlorhexidine  15 mL Mouth Rinse BID   ciprofloxacin  1 drop Both Eyes TID   enoxaparin (LOVENOX) injection  40 mg Subcutaneous Daily   feeding supplement (PROSource TF)  90 mL Per Tube BID   free water  200 mL Per Tube Q8H   insulin aspart  0-9 Units Subcutaneous Q4H   mouth rinse  15 mL Mouth Rinse q12n4p   metoCLOPramide  10 mg Per Tube TID AC & HS   metoprolol tartrate  12.5 mg Per Tube BID   mirtazapine  7.5 mg Per Tube QHS   pantoprazole sodium  40 mg Per Tube  Daily   QUEtiapine  50 mg Per Tube QHS   sertraline  50 mg Per Tube Daily   Zinc Oxide   Topical BID   Continuous Infusions:  sodium chloride Stopped (07/28/21 2105)   feeding supplement (NEPRO CARB STEADY) 50 mL/hr at 07/30/21 0600     LOS: 8 days   Time spent: , case discussed with palliative care at bedside Greater than 50% of this time was spent in counseling, explanation of diagnosis, planning of further management, and coordination of care.   Voice Recognition Reubin Milan dictation system was used to create this note, attempts have been made to correct errors. Please contact the author with questions and/or clarifications.   Albertine Grates, MD PhD FACP [facility]  Available via Epic secure chat 7am-7pm for nonurgent issues Please page for urgent issues To page the attending provider between 7A-7P or the covering provider during after hours 7P-7A, please log into the web site [email or web address] and access using universal [place] password for that web site. If you do not have  the password, please call the hospital operator.    07/31/2021, 3:19 PM

## 2021-07-31 NOTE — Progress Notes (Signed)
Physical Therapy Treatment Patient Details Name: Benjamin Barnett MRN: [id number] DOB: 01-11-1946 Today's Date: 07/31/2021    History of Present Illness Pt is a 75 y.o. from [facility] ([facility]'d [facility] earlier that day after several week admission) admitted 07/23/21 with AMS. Workup for sepsis due to right facial cellulitis and acute parotitis. Course complicated by acute respiratory failure with hypoxia; ETT 8/12-8/15. S/p EEG 8/18 suggestive of moderate diffuse encephalopathy; no seizure activity recorded. PMH includes PEA arrest with suspected hypoxic brain injury (with extended hospital stay 6/2-6/16/22), HTN, HLD, retroperitoneal hematoma, G-tube placement, MRSA pneumonia.   PT Comments    Pt initially with improved alertness at beginning of session, answering some questions although noted confusion and disorientation; follows simple commands <25% of time. Pt requires totalA+2 for bed-level mobility; requires maxA+2 for anterior weight translation from elevated HOB. Pt quick to fatigue, with less verbalizations, unintelligible speech, requiring max cues to keep eyes open and converse. Repositioned in modified chair position to encourage midline posture and pressure relief. Will continue to follow acutely to address established goals. Continue to recommend SNF-level therapies pending activity progression and pt's ability to participate.    Follow Up Recommendations  SNF;Supervision/Assistance - 24 hour     Equipment Recommendations  None recommended by PT    Recommendations for Other Services       Precautions / Restrictions Precautions Precautions: Fall;Other (comment) Precaution Comments: peg tube, flexiseal (leaking), bilateral soft mitts; nystagmus with all position changes/head turns Restrictions Weight Bearing Restrictions: No    Mobility  Bed Mobility Overal bed mobility: Needs Assistance Bed Mobility: Rolling Rolling: Total assist;+2 for physical  assistance;+2 for safety/equipment         General bed mobility comments: TotalA+2 for rolling R/L for pericare and linen change, pt appears to attempt to assist with UE support on bed rail, but ultimately unable to maintain grip; totalA for scooting up and repositioning to encourage midline posture; bed left in modified chair position    Transfers                 General transfer comment: Deferred secondary to fatigue/lethargy after bed mobility; will require maximove lift  Ambulation/Gait                 Stairs             Wheelchair Mobility    Modified Rankin (Stroke Patients Only)       Balance                                            Cognition Arousal/Alertness: Awake/alert;Lethargic Behavior During Therapy: Flat affect Overall Cognitive Status: Difficult to assess Area of Impairment: Orientation;Attention;Memory;Following commands;Awareness;Problem solving;Safety/judgement                   Current Attention Level: Focused   Following Commands: Follows one step commands inconsistently Safety/Judgement: Decreased awareness of deficits Awareness: Intellectual Problem Solving: Slow processing;Decreased initiation;Difficulty sequencing;Requires verbal cues;Requires tactile cues General Comments: Initially conversant, attempting to answer questions and follow commands <25%; speech initially intelligible, although pt confused; pt quick to fatigue with unintelligible speech, eyes closed requiring max cues to stay awake/converse      Exercises Other Exercises Other Exercises: Attempted anterior weight translations from elevated HOB, pt requiring BUE support and maxA+2 to lean forward Other Exercises: Cervical stretches - R-side rotation and  lateral flexion - significant tightness    General Comments General comments (skin integrity, edema, etc.): Postioned to encourage neutral cervical/trunk posture (pt with L lateral  lean, L cervical rotation), bilateral heels floated for pressure relief      Pertinent Vitals/Pain Pain Assessment: Faces Faces Pain Scale: Hurts a little bit Pain Location: grimacing/guarding with perineal care Pain Descriptors / Indicators: Grimacing;Guarding Pain Intervention(s): Monitored during session;Limited activity within patient's tolerance    Home Living                      Prior Function            PT Goals (current goals can now be found in the care plan section) Progress towards PT goals: Progressing toward goals (slowly, some improved alertness at beginning of session)    Frequency    Min 2X/week      PT Plan Current plan remains appropriate    Co-evaluation PT/OT/SLP Co-Evaluation/Treatment: Yes Reason for Co-Treatment: Complexity of the patient's impairments (multi-system involvement);Necessary to address cognition/behavior during functional activity;For patient/therapist safety PT goals addressed during session: Mobility/safety with mobility        AM-PAC PT "6 Clicks" Mobility   Outcome Measure  Help needed turning from your back to your side while in a flat bed without using bedrails?: Total Help needed moving from lying on your back to sitting on the side of a flat bed without using bedrails?: Total Help needed moving to and from a bed to a chair (including a wheelchair)?: Total Help needed standing up from a chair using your arms (e.g., wheelchair or bedside chair)?: Total Help needed to walk in hospital room?: Total Help needed climbing 3-5 steps with a railing? : Total 6 Click Score: 6    End of Session   Activity Tolerance: Patient limited by fatigue;Patient limited by lethargy Patient left: in bed;with call bell/phone within reach;with bed alarm set Nurse Communication: Mobility status;Need for lift equipment PT Visit Diagnosis: Muscle weakness (generalized) (M62.81);Difficulty in walking, not elsewhere classified (R26.2)      Time: 6811-5726 PT Time Calculation (min) (ACUTE ONLY): 24 min  Charges:  $Therapeutic Activity: 8-22 mins                     Benjamin Barnett, PT, DPT Acute Rehabilitation Services  Pager [phone] Office [phone]  Malachy Chamber 07/31/2021, 5:31 PM

## 2021-07-31 NOTE — Progress Notes (Signed)
  Speech Language Pathology Treatment: Dysphagia  Patient Details Name: Benjamin Barnett MRN: [id number] DOB: November 06, 1946 Today's Date: 07/31/2021 Time: 0045-9977 SLP Time Calculation (min) (ACUTE ONLY): 15 min  Assessment / Plan / Recommendation Clinical Impression  Pt was alert for PO trials but with fluctuating awareness of boluses, felt to be related to attention level. At times he would have more swift acceptance with bites of ice cream, and other times he would not open his mouth. The only coughing he exhibited was after a straw sip of water, of which he only took two prior to declining anything further. He had no overt signs of aspiration with ice cream, which would be in line with FEES results. Recommend that he remain NPO, although staff could offer a few bites of pudding or ice cream at a time to offer him additional practice to try to increase awareness to task. SLP will also continue to follow.    HPI HPI: Pt is a 75 y.o. male who presented to the ED from [facility] with AMS. ETT 8/12-8/15 (0930). 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 CT soft tissue neck 8/12: Multispatial inflammation and edema in the right face and neck, moderately involving the right lateral pharyngeal wall and the supraglottic larynx. ENT on 8/12 reports "There is no airway compromise by imaging or by clinical examination." PMH: prior PEA arrest with anoxic brain injury, retroperitoneal hematoma, PEG, HTN, GERD, and prior MRSA pneumonia. Patient was recently discharged to SNF after prolonged hospital stay after suffering a PEA arrest and retroperitoneal hematoma.  Patient was only at SNF for approx 1 hr before EMS was called. Per daughter pts swallowing has never been assessed since his arrest. No [facility] SLP notes seen in paper chart.      SLP Plan  Continue with current plan of care       Recommendations  Diet recommendations: NPO Medication  Administration: Via alternative means                Oral Care Recommendations: Oral care QID;Staff/trained caregiver to provide oral care Follow up Recommendations: Skilled Nursing facility SLP Visit Diagnosis: Dysphagia, unspecified (R13.10) Plan: Continue with current plan of care       GO                Benjamin Barnett., M.A. CCC-SLP Acute Rehabilitation Services Pager [phone] Office [phone]  07/31/2021, 3:09 PM

## 2021-07-31 NOTE — Consult Note (Signed)
Palliative Care Consult Note                                  Date: 07/31/2021   Patient Name: Benjamin Barnett  DOB: [birth year]  MRN: [id number]  Age / Sex: 75 y.o., male  PCP: Garwin Brothers, MD Referring Physician: Florencia Reasons, MD  Reason for Consultation: Establishing goals of care  HPI/Patient Profile: Palliative Care consult requested for goals of care discussion in this 75 y.o. male  with past medical history of DVT, hypertension, GERD, MRSA, hyperlipidemia, and cardiac arrest (June 2022). He was admitted on 07/23/2021 from  [facility] with complaints of lethargy. Patient discharged on day of admission from [place] and had only been at [facility] facility for an hour.   Past Medical History:  Diagnosis Date   Cardiac arrest ([place]) 07/24/2021   DVT of axillary vein, acute right ([place]) 05/2021   Essential hypertension 07/24/2021   GERD without esophagitis 07/24/2021   Gout 07/24/2021   History of MRSA infection of lungs 07/24/2021   Mixed hyperlipidemia 07/24/2021   Retroperitoneal hematoma 2022     Subjective:   This NP Osborne Oman reviewed medical records, received report from team, assessed the patient and then met at the patient's bedside with patient's daughter Estill Bamberg to discuss diagnosis, prognosis, GOC, EOL wishes disposition and options.  [name] is much more awake and alert than previous days per report. He answers some questions appropriately (name and location). Will follow simple commands, denies pain or shortness of breath.    Concept of Palliative Care was introduced as specialized medical care for people and their families living with serious illness.  It focuses on providing relief from the symptoms and stress of a serious illness.  The goal is to improve quality of life for both the patient and the family. Values and goals of care important to patient and family were attempted to be elicited.  I  created space and opportunity for family to explore state of health prior to admission, thoughts, and feelings. Estill Bamberg shares her father's significant decline in health over the past 6 months s/p cardiac arrest. He has a PEG tube for nutrition and now requires assistance with all ADLs. She reports patient was able to engage in discussions appropriate despite health decline.   We discussed His current illness and what it means in the larger context of His on-going co-morbidities. Natural disease trajectory and expectations were discussed.  Daughter verbalized understanding of current illness and co-morbidities. She is tearful expressing she initially was hopeful her father would show some improvement and have the ability to return home at some point. She is realistic in her expressed understanding of poor prognosis long-term.   I discussed the importance of continued conversation with family and their medical providers regarding overall plan of care and treatment options, ensuring decisions are within the context of the patients values and GOCs.  Questions and concerns were addressed.   The family was encouraged to call with questions or concerns.  PMT will continue to support holistically as needed.  Objective:   Primary Diagnoses: Present on Admission:  Sepsis ([place])  Acute suppurative parotitis  Acute metabolic encephalopathy  Protein calorie malnutrition (HCC)  GERD without esophagitis  Lactic acidosis  Hypokalemia  Essential hypertension   Scheduled Meds:  amantadine  100 mg Per Tube BID   amLODipine  10 mg Per Tube Daily   chlorhexidine  15 mL Mouth Rinse BID   ciprofloxacin  1 drop Both Eyes TID   enoxaparin (LOVENOX) injection  40 mg Subcutaneous Daily   feeding supplement (PROSource TF)  90 mL Per Tube BID   free water  200 mL Per Tube Q8H   insulin aspart  0-9 Units Subcutaneous Q4H   mouth rinse  15 mL Mouth Rinse q12n4p   metoCLOPramide  10 mg Per Tube TID AC & HS    metoprolol tartrate  12.5 mg Per Tube BID   mirtazapine  7.5 mg Per Tube QHS   pantoprazole sodium  40 mg Per Tube Daily   QUEtiapine  50 mg Per Tube QHS   sertraline  50 mg Per Tube Daily   Zinc Oxide   Topical BID    Continuous Infusions:  sodium chloride Stopped (07/28/21 2105)   feeding supplement (NEPRO CARB STEADY) 50 mL/hr at 07/30/21 0600    PRN Meds: sodium chloride, acetaminophen **OR** acetaminophen, polyvinyl alcohol  Allergies  Allergen Reactions   Codeine Rash   Furosemide Rash   Ibuprofen Rash   Meperidine Rash   Tape Other (See Comments)    Other reaction(s): Redness    Review of Systems  Unable to perform ROS: Acuity of condition  Unless otherwise noted, a complete review of systems is negative.  Physical Exam General: NAD, chronically-ill appearing Cardiovascular: regular rate and rhythm Pulmonary: diminished bilaterally  Abdomen: soft, nontender, + bowel sounds Extremities: no edema, no joint deformities Skin: no rashes, warm and dry Neurological: alert to self, will follow simple commands   Vital Signs:  BP (!) 155/81 (BP Location: Right Arm)   Pulse 85   Temp 98.7 F (37.1 C) (Axillary)   Resp 18   Ht 5' 10"  (1.778 m)   Wt 91.1 kg   SpO2 96%   BMI 28.82 kg/m  Pain Scale: PAINAD      SpO2: SpO2: 96 % O2 Device:SpO2: 96 % O2 Flow Rate: .O2 Flow Rate (L/min): 4 L/min  IO: Intake/output summary:  Intake/Output Summary (Last 24 hours) at 07/31/2021 1658 Last data filed at 07/31/2021 1316 Gross per 24 hour  Intake 890 ml  Output 2500 ml  Net -1610 ml    LBM: Last BM Date: 07/30/21 (per rectal tube) Baseline Weight: Weight: 96.6 kg Most recent weight: Weight: 91.1 kg      Palliative Assessment/Data: PEG, bedbound    Advanced Care Planning:   Primary Decision Maker: HCPOA  Code Status/Advance Care Planning: DNR  A discussion was had today regarding advanced directives. Concepts specific to code status, artifical feeding and  hydration, continued IV antibiotics and rehospitalization was had.    I discussed at length patient's full code status with consideration of his current illness and co-morbidities. Education provided on what a cardiopulmonary event would look like for patient with no meaningful recovery and poor chances of survival. Estill Bamberg verbalized understanding and expressed wishes to change patient to DNR/DNI.   Estill Bamberg is clear in expressed wishes to continue to treat the treatable and allow patient every opportunity to stabilize. She is hopeful patient will discharge to SNF and continue with ongoing care and support.   Hospice and Palliative Care services outpatient were explained and offered. Patient and family verbalized their understanding and awareness of both palliative and hospice's goals and philosophy of care. Outpatient Palliative recommended at discharge for ongoing discussions and support. Estill Bamberg is aware patient may transition to hospice support at anytime by expressing wishes to medical team.  Assessment & Plan:   SUMMARY OF RECOMMENDATIONS   DNR/DNI-as confirmed by daughter/HCPOA Continue with current plan of care  Estill Bamberg is clear in expressed wishes to continue to treat the treatable, no escalation of care. She is remaining hopeful for some stability with awareness of new baseline and goal of discharging to SNF. Outpatient Palliative support for ongoing discussions and decision making.  PMT will continue to support and follow as needed. Please call team line with urgent needs.  Symptom Management:  Per Attending  Palliative Prophylaxis:  Aspiration, Bowel Regimen, Frequent Pain Assessment, Oral Care, and Turn Reposition  Additional Recommendations (Limitations, Scope, Preferences): Treat the treatable, DNR  Psycho-social/Spiritual:  Desire for further Chaplaincy support: no Additional Recommendations:  ongoing goals of care discussions  Prognosis:  Guarded-Poor  Discharge  Planning:  [place] for rehab with Palliative care service follow-up   Discussed with: Dr. Annamaria Boots and RN.    Daughter expressed understanding and was in agreement with this plan.   Time In: 1600 Time Out: 1655 Time Total: 55 min.   Visit consisted of counseling and education dealing with the complex and emotionally intense issues of symptom management and palliative care in the setting of serious and potentially life-threatening illness.Greater than 50%  of this time was spent counseling and coordinating care related to the above assessment and plan.  Signed by:  Alda Lea, AGPCNP-BC Palliative Medicine Team  Phone: [phone] Pager: [phone] Amion: Bjorn Pippin   Thank you for allowing the Palliative Medicine Team to assist in the care of this patient. Please utilize secure chat with additional questions, if there is no response within 30 minutes please call the above phone number. Palliative Medicine Team providers are available by phone from 7am to 5pm daily and can be reached through the team cell phone.  Should this patient require assistance outside of these hours, please call the patient's attending physician.

## 2021-07-31 NOTE — TOC Progression Note (Signed)
Transition of Care [facility]) - Progression Note    Patient Details  Name: Benjamin Barnett MRN: [id number] Date of Birth: 1946/11/23  Transition of [facility] CM/SW Contact  Terrial Rhodes, LCSWA Phone Number: 07/31/2021, 10:00 AM  Clinical Narrative:     Patient has SNF bed at [facility] when medically ready. CSW will continue to follow and assist with dc planning needs.  Expected Discharge Plan: Skilled Nursing Facility Barriers to Discharge: Continued Medical Work up  Expected Discharge Plan and Services Expected Discharge Plan: Skilled Nursing Facility In-house Referral: Clinical Social Work     Living arrangements for the past 2 months: Skilled Nursing Facility                                       Social Determinants of Health (SDOH) Interventions    Readmission Risk Interventions No flowsheet data found.

## 2021-07-31 NOTE — Progress Notes (Signed)
Occupational Therapy Treatment Patient Details Name: Benjamin Barnett MRN: [id number] DOB: January 05, 1946 Today's Date: 07/31/2021    History of present illness Pt is a 75 y.o. from [facility] (d/c'd from [facility] earlier that day after several week admission) admitted 07/23/21 with AMS. Workup for sepsis due to right facial cellulitis and acute parotitis. Course complicated by acute respiratory failure with hypoxia; ETT 8/12-8/15. S/p EEG 8/18 suggestive of moderate diffuse encephalopathy; no seizure activity recorded. PMH includes PEA arrest with suspected hypoxic brain injury (with extended hospital stay 6/2-6/16/22), HTN, HLD, retroperitoneal hematoma, G-tube placement, MRSA pneumonia.   OT comments  Pt with improved alertness in the beginning of the session, as well as ability to follow simple commands inconsistently. At this time, pt continues to demonstrate confusion/disorientation. Pt worked with therapies on bed level mobility this session and anterior weight translation for elevated HOB. Pt requiring max-total A +2 for bed mobility and max A +2 for anterior weight translation. Pt fatigued quickly with session, falling asleep at the end with less verbalizations an unable to follow commands again. OT will continue to follow acutely, with continued goal for SNF level therapies.    Follow Up Recommendations  SNF;Supervision/Assistance - 24 hour    Equipment Recommendations  None recommended by OT    Recommendations for Other Services      Precautions / Restrictions Precautions Precautions: Fall;Other (comment) Precaution Comments: peg tube, flexiseal (leaking), bilateral soft mitts; nystagmus with all position changes/head turns Restrictions Weight Bearing Restrictions: No       Mobility Bed Mobility Overal bed mobility: Needs Assistance Bed Mobility: Rolling Rolling: Total assist;+2 for physical assistance;+2 for safety/equipment         General bed mobility  comments: TotalA+2 for rolling R/L for pericare and linen change, pt appears to attempt to assist with UE support on bed rail, but ultimately unable to maintain grip; totalA for scooting up and repositioning to encourage midline posture; bed left in modified chair position    Transfers                 General transfer comment: Deferred secondary to fatigue/lethargy after bed mobility; will require maximove lift    Balance                                           ADL either performed or assessed with clinical judgement   ADL Overall ADL's : Needs assistance/impaired             Lower Body Bathing: Total assistance;+2 for safety/equipment;+2 for physical assistance;Bed level Lower Body Bathing Details (indicate cue type and reason): Rectal tube leaked, requiring clean up in bed                       General ADL Comments: Bed level activities this session     Vision       Perception     Praxis      Cognition Arousal/Alertness: Awake/alert;Lethargic Behavior During Therapy: Flat affect Overall Cognitive Status: Difficult to assess Area of Impairment: Orientation;Attention;Memory;Following commands;Awareness;Problem solving;Safety/judgement                   Current Attention Level: Focused   Following Commands: Follows one step commands inconsistently Safety/Judgement: Decreased awareness of deficits Awareness: Intellectual Problem Solving: Slow processing;Decreased initiation;Difficulty sequencing;Requires verbal cues;Requires tactile cues General Comments: Initially conversant,  attempting to answer questions and follow commands <25%; speech initially intelligible, although pt confused; pt quick to fatigue with unintelligible speech, eyes closed requiring max cues to stay awake/converse        Exercises Other Exercises Other Exercises: Attempted anterior weight translations from elevated HOB, pt requiring BUE support and  maxA+2 to lean forward Other Exercises: Cervical stretches - R-side rotation and lateral flexion - significant tightness   Shoulder Instructions       General Comments Postioned to encourage neutral cervical/trunk posture (pt with L lateral lean, L cervical rotation), bilateral heels floated for pressure relief    Pertinent Vitals/ Pain       Pain Assessment: Faces Faces Pain Scale: Hurts a little bit Pain Location: grimacing/guarding with perineal care Pain Descriptors / Indicators: Grimacing;Guarding Pain Intervention(s): Limited activity within patient's tolerance;Monitored during session;Repositioned  Home Living                                          Prior Functioning/Environment              Frequency  Min 2X/week        Progress Toward Goals  OT Goals(current goals can now be found in the care plan section)  Progress towards OT goals: OT to reassess next treatment  Acute Rehab OT Goals Patient Stated Goal: None stated OT Goal Formulation: Patient unable to participate in goal setting Time For Goal Achievement: 08/11/21 Potential to Achieve Goals: Good ADL Goals Pt Will Perform Eating: Independently;sitting Pt Will Perform Grooming: with min assist;sitting Pt Will Perform Upper Body Dressing: with min assist;sitting Pt Will Perform Lower Body Dressing: with min assist;sit to/from stand;sitting/lateral leans Pt Will Transfer to Toilet: with min assist;ambulating;bedside commode Pt Will Perform Toileting - Clothing Manipulation and hygiene: with min assist;sit to/from stand;sitting/lateral leans Pt/caregiver will Perform Home Exercise Program: Increased ROM;Increased strength;Both right and left upper extremity;With Supervision Additional ADL Goal #1: Patient will demonstrate bed mobility with Min A in prep for ADLs and functional transfers. Additional ADL Goal #2: Patient will complete sit to stand transfers with Min A +2 in prep for ADLs  and functional transfers.  Plan Discharge plan remains appropriate;Frequency remains appropriate    Co-evaluation    PT/OT/SLP Co-Evaluation/Treatment: Yes Reason for Co-Treatment: Complexity of the patient's impairments (multi-system involvement);Necessary to address cognition/behavior during functional activity;For patient/therapist safety PT goals addressed during session: Mobility/safety with mobility OT goals addressed during session: ADL's and self-care      AM-PAC OT "6 Clicks" Daily Activity     Outcome Measure   Help from another person eating meals?: Total Help from another person taking care of personal grooming?: Total Help from another person toileting, which includes using toliet, bedpan, or urinal?: Total Help from another person bathing (including washing, rinsing, drying)?: Total Help from another person to put on and taking off regular upper body clothing?: Total Help from another person to put on and taking off regular lower body clothing?: Total 6 Click Score: 6    End of Session    OT Visit Diagnosis: Unsteadiness on feet (R26.81);Other abnormalities of gait and mobility (R26.89);Muscle weakness (generalized) (M62.81);Other symptoms and signs involving cognitive function   Activity Tolerance Patient limited by fatigue;Patient limited by lethargy   Patient Left in bed;with call bell/phone within reach;with bed alarm set   Nurse Communication Mobility status  Time: 1027-2536 OT Time Calculation (min): 24 min  Charges: OT General Charges $OT Visit: 1 Visit OT Treatments $Self Care/Home Management : 8-22 mins  Bridey Brookover H., OTR/L Acute Rehabilitation  Tashiba Timoney Elane Terriann Difonzo 07/31/2021, 6:04 PM

## 2021-08-01 ENCOUNTER — Inpatient Hospital Stay (HOSPITAL_COMMUNITY): Payer: Medicare Other

## 2021-08-01 LAB — CBC WITH DIFFERENTIAL/PLATELET
Abs Immature Granulocytes: 0.22 10*3/uL — ABNORMAL HIGH (ref 0.00–0.07)
Basophils Absolute: 0.1 10*3/uL (ref 0.0–0.1)
Basophils Relative: 1 %
Eosinophils Absolute: 0.6 10*3/uL — ABNORMAL HIGH (ref 0.0–0.5)
Eosinophils Relative: 6 %
HCT: 34.9 % — ABNORMAL LOW (ref 39.0–52.0)
Hemoglobin: 11.6 g/dL — ABNORMAL LOW (ref 13.0–17.0)
Immature Granulocytes: 2 %
Lymphocytes Relative: 13 %
Lymphs Abs: 1.3 10*3/uL (ref 0.7–4.0)
MCH: 29.3 pg (ref 26.0–34.0)
MCHC: 33.2 g/dL (ref 30.0–36.0)
MCV: 88.1 fL (ref 80.0–100.0)
Monocytes Absolute: 0.9 10*3/uL (ref 0.1–1.0)
Monocytes Relative: 9 %
Neutro Abs: 6.8 10*3/uL (ref 1.7–7.7)
Neutrophils Relative %: 69 %
Platelets: 189 10*3/uL (ref 150–400)
RBC: 3.96 MIL/uL — ABNORMAL LOW (ref 4.22–5.81)
RDW: 16.2 % — ABNORMAL HIGH (ref 11.5–15.5)
WBC: 9.9 10*3/uL (ref 4.0–10.5)
nRBC: 0 % (ref 0.0–0.2)

## 2021-08-01 LAB — GLUCOSE, CAPILLARY
Glucose-Capillary: 109 mg/dL — ABNORMAL HIGH (ref 70–99)
Glucose-Capillary: 111 mg/dL — ABNORMAL HIGH (ref 70–99)
Glucose-Capillary: 113 mg/dL — ABNORMAL HIGH (ref 70–99)
Glucose-Capillary: 120 mg/dL — ABNORMAL HIGH (ref 70–99)
Glucose-Capillary: 129 mg/dL — ABNORMAL HIGH (ref 70–99)
Glucose-Capillary: 130 mg/dL — ABNORMAL HIGH (ref 70–99)

## 2021-08-01 LAB — PROCALCITONIN: Procalcitonin: 0.2 ng/mL

## 2021-08-01 LAB — LACTIC ACID, PLASMA: Lactic Acid, Venous: <0.3 mmol/L — ABNORMAL LOW (ref 0.5–1.9)

## 2021-08-01 MED ORDER — IPRATROPIUM-ALBUTEROL 0.5-2.5 (3) MG/3ML IN SOLN
3.0000 mL | Freq: Three times a day (TID) | RESPIRATORY_TRACT | Status: DC
  Administered 2021-08-01 – 2021-08-02 (×3): 3 mL via RESPIRATORY_TRACT
  Filled 2021-08-01 (×4): qty 3

## 2021-08-01 MED ORDER — IPRATROPIUM-ALBUTEROL 0.5-2.5 (3) MG/3ML IN SOLN: 3.0000 mL | Freq: Three times a day (TID) | RESPIRATORY_TRACT | Status: DC

## 2021-08-01 NOTE — Progress Notes (Addendum)
PROGRESS NOTE    Benjamin Barnett  ZOX:[id number] DOB: 27-Jun-1946 DOA: 07/23/2021 PCP: Eloisa Northern, MD    No chief complaint on file.   Brief Narrative:  H/o PEA arrest in 05/2021 resulting in prolonged hospitalization including prolonged stay at an LTAC, he was treated for MRSA PNA and had retroperitoneal hematoma ,he also underwent  peg placement during in 06/2021 during LTAC stay, he was [facility] on 8/11 but brought back to ED on the same day due to being extremely lethargic and minimally responsive, he was found to have sepsis from acute parotitis and facial cellulitis required intubation, he was extubated on 8/15and transferred to hospitalist service on 8/16  Subjective:   Had tachypenia overnight, no hypoxia, cxr obtained overnight "Elevated left hemidiaphragm with left lung base atelectasis.Developing infiltrate is not excluded He is maintaining his airway , no hypoxia on room air He open his eyes spontaneously, he did not respond to questions this am No agitation currently No fever  Tolerating tube feeds + rectal tube with liquid stool + condom cath with clear urine   Assessment & Plan:   Principal Problem:   Sepsis (HCC) Active Problems:   Acute suppurative parotitis   Acute metabolic encephalopathy   Protein calorie malnutrition (HCC)   GERD without esophagitis   Lactic acidosis   Hypokalemia   Essential hypertension   Pressure injury of skin   Acute hypoxic respiratory failure/sepsis due to right facial cellulitis and acute parotitis -He was seen by ENT, there is no abscess, recommend IV antibiotic treatment -He was intubated and transferred to ICU, successfully extubated on 8/15, transferred to hospitalist service on 8/16 -Is treated with Zyvox and Unasyn since admission, last dose scheduled on 8/17, blood culture no growth -on room air, maintaining airway -Plan to discharge on 8/20 canceled due to overnight patient developed  tachypnea, chest x-ray obtained overnight concerning of developing pneumonia in the right lower lobe Will get lactic acid level ,procalcitonin  level and CBC, schedule nebs to help aeration, he can not use incentive spirometer,   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 -mri brain in 7/14 no acute findings - EEG no seizure spikes -Daughter states 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, he then was dealing with bradycardia and cellulitis in his arm, and had PEA arrest in June 2022 -goals of care discussion, I have discussed with daughter if there is no reversible cause found for his encephalopathy, then patient's encephalopathy likely from 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 which he is  less likely to recover from, daughter is advised to think about code status , palliative care  consult placed for continued goals of care discussion and family support  Dysphagia S/p peg tube placement, with loose stool, d/c miralax   HTN: Blood pressure improved on  Norvasc and lopressor Continue titrate blood pressure meds as needed  Hypokalemia/hypomagnesemia Replace K and mag Repeat in the morning  AKI on CKD 2 Creatinine peaked at 1.57 Creatinine improved , today cr is 0.96 on 8/18 Monitor   MRSA colonization He was treated with MRSA pneumonia in 05/2021 Treated with zyvox this hospitalization as above Also finished decolonization treatment     Body mass index is 28.82 kg/m.Marland Kitchen Seen by dietician.  I agree with the assessment and plan as outlined below: Nutrition Status: Nutrition Problem: Inadequate oral intake Etiology: inability to eat Signs/Symptoms: NPO status Interventions: Tube feeding, Prostat  .     Unresulted Labs (  From admission, onward)     Start     Ordered   08/02/21 0500  CBC with Differential/Platelet  Tomorrow morning,   R       Question:  Specimen collection method  Answer:  Lab=Lab collect   08/01/21 1034   08/02/21  0500  Basic metabolic panel  Tomorrow morning,   R       Question:  Specimen collection method  Answer:  Lab=Lab collect   08/01/21 1034   08/02/21 0500  Lactic acid, plasma  Tomorrow morning,   R       Question:  Specimen collection method  Answer:  Lab=Lab collect   08/01/21 1034   08/02/21 0500  Procalcitonin  Daily,   R     Question:  Specimen collection method  Answer:  Lab=Lab collect   08/01/21 1034   08/01/21 1035  Lactic acid, plasma  ONCE - STAT,   STAT       Question:  Specimen collection method  Answer:  Lab=Lab collect   08/01/21 1034   08/01/21 1035  CBC with Differential/Platelet  ONCE - STAT,   STAT       Question:  Specimen collection method  Answer:  Lab=Lab collect   08/01/21 1034   08/01/21 1035  Procalcitonin - Baseline  ONCE - STAT,   STAT       Question:  Specimen collection method  Answer:  Lab=Lab collect   08/01/21 1034   07/31/21 1743  SARS CORONAVIRUS 2 (TAT 6-24 HRS) Nasopharyngeal Nasopharyngeal Swab  Once,   R       Question Answer Comment  Is this test for diagnosis or screening Screening   Symptomatic for COVID-19 as defined by CDC No   Hospitalized for COVID-19 No   Admitted to ICU for COVID-19 No   Previously tested for COVID-19 Yes   Resident in a congregate (group) care setting Yes   Employed in healthcare setting No   Has patient completed COVID vaccination(s) (2 doses of Pfizer/Moderna 1 dose of [employer]) Unknown      07/31/21 1742              DVT prophylaxis: enoxaparin (LOVENOX) injection 40 mg Start: 07/24/21 1000   Code Status:DNR Family Communication: Daughter over the phone on 8/17 and 8/20 Disposition:   Status is: Inpatient   Dispo: The patient is from: SNF              Anticipated d/c is to: SNF with palliative care               Anticipated d/c date is: possible d/c to snf on 8/21 if respiratory status stable               Consultants:  Critical care ENT Palliative care  Procedures:   Intubation/extubation   Antimicrobials:    Anti-infectives (From admission, onward)    Start     Dose/Rate Route Frequency Ordered Stop   07/28/21 2200  linezolid (ZYVOX) IVPB 600 mg  Status:  Discontinued        600 mg 300 mL/hr over 60 Minutes Intravenous Every 12 hours 07/28/21 1118 07/28/21 1127   07/28/21 1127  linezolid (ZYVOX) IVPB 600 mg        600 mg 300 mL/hr over 60 Minutes Intravenous Every 12 hours 07/28/21 1127 07/30/21 0729   07/26/21 1000  linezolid (ZYVOX) IVPB 600 mg  Status:  Discontinued        600 mg  300 mL/hr over 60 Minutes Intravenous Every 12 hours 07/26/21 0829 07/28/21 1118   07/24/21 2200  vancomycin (VANCOREADY) IVPB 1250 mg/250 mL  Status:  Discontinued        1,250 mg 166.7 mL/hr over 90 Minutes Intravenous Every 24 hours 07/23/21 2201 07/26/21 0829   07/24/21 0600  ceFEPIme (MAXIPIME) 2 g in sodium chloride 0.9 % 100 mL IVPB  Status:  Discontinued        2 g 200 mL/hr over 30 Minutes Intravenous Every 12 hours 07/23/21 2115 07/24/21 0205   07/24/21 0230  Ampicillin-Sulbactam (UNASYN) 3 g in sodium chloride 0.9 % 100 mL IVPB        3 g 200 mL/hr over 30 Minutes Intravenous Every 8 hours 07/24/21 0211 07/30/21 0729   07/23/21 2130  vancomycin (VANCOREADY) IVPB 1250 mg/250 mL  Status:  Discontinued        1,250 mg 166.7 mL/hr over 90 Minutes Intravenous Every 24 hours 07/23/21 2115 07/23/21 2201   07/23/21 2125  vancomycin (VANCOREADY) IVPB 2000 mg/400 mL        2,000 mg 200 mL/hr over 120 Minutes Intravenous  Once 07/23/21 1933 07/23/21 2338   07/23/21 1945  ceFEPIme (MAXIPIME) 2 g in sodium chloride 0.9 % 100 mL IVPB        2 g 200 mL/hr over 30 Minutes Intravenous  Once 07/23/21 1933 07/23/21 2032   07/23/21 1945  metroNIDAZOLE (FLAGYL) IVPB 500 mg        500 mg 100 mL/hr over 60 Minutes Intravenous  Once 07/23/21 1933 07/23/21 2102          Objective: Vitals:   08/01/21 0058 08/01/21 0450 08/01/21 0826 08/01/21 0914  BP: 138/69 124/80  (!) (P) 161/72   Pulse: 81 89 (P) 90 87  Resp: (!) 26 (!) 22 (!) (P) 24   Temp: 99.3 F (37.4 C) 98.9 F (37.2 C)    TempSrc: Oral Oral    SpO2: 96% 96% (P) 99%   Weight:      Height:        Intake/Output Summary (Last 24 hours) at 08/01/2021 1035 Last data filed at 08/01/2021 0450 Gross per 24 hour  Intake --  Output 1850 ml  Net -1850 ml   Filed Weights   07/23/21 2059 07/24/21 1906  Weight: 96.6 kg 91.1 kg    Examination:  General exam: Open eyes spontaneously, does not follow commands, + peg tube, + rectal tube, + condom catheter  Respiratory system: diminished at basis, no wheezing, no rales, no rhonchi.  Tachypneic respiration rate in high 20s  Cardiovascular system: S1 & S2 heard, RRR, + murmur Gastrointestinal system: + peg tube, Abdomen is nondistended, soft and nontender.  Normal bowel sounds heard. Central nervous system: Open eyes spontaneously, does not follow command Extremities: No edema Skin: No rashes, lesions or ulcers Psychiatry: does not follow command, currently no agitation    Data Reviewed: I have personally reviewed following labs and imaging studies  CBC: Recent Labs  Lab 07/26/21 0111 07/27/21 0625 07/28/21 0013 07/28/21 1443 07/29/21 0201  WBC 10.2 9.7 11.6*  --  8.7  NEUTROABS  --   --   --   --  5.9  HGB 9.0* 9.7* 10.2* 10.5* 10.6*  HCT 29.0* 31.0* 33.1* 31.0* 32.3*  MCV 92.1 92.3 93.0  --  88.5  PLT 146* 163 153  --  148*    Basic Metabolic Panel: Recent Labs  Lab 07/26/21 0111 07/27/21 0625 07/28/21 0013 07/28/21  1443 07/29/21 0201 07/30/21 0337  NA 139 139 138 141 136 132*  K 3.8 3.8 3.1* 3.3* 3.2* 3.9  CL 107 107 109  --  104 99  CO2 21* 26 20*  --  23 24  GLUCOSE 156* 101* 105*  --  104* 99  BUN 44* 44* 37*  --  25* 19  CREATININE 1.57* 1.31* 1.10  --  1.00 0.96  CALCIUM 9.6 9.2 8.9  --  8.8* 9.4  MG  --   --   --   --  1.6* 2.0  PHOS 2.8 3.0 1.1*  --   --   --     GFR: Estimated Creatinine Clearance: 78.9  mL/min (by C-G formula based on SCr of 0.96 mg/dL).  Liver Function Tests: Recent Labs  Lab 07/26/21 0111 07/27/21 0625 07/28/21 0013  ALBUMIN 2.1* 2.4* 2.5*    CBG: Recent Labs  Lab 07/31/21 1605 07/31/21 2022 08/01/21 0022 08/01/21 0430 08/01/21 0752  GLUCAP 109* 122* 120* 113* 130*     Recent Results (from the past 240 hour(s))  SARS CORONAVIRUS 2 (TAT 6-24 HRS) Nasopharyngeal Nasopharyngeal Swab     Status: None   Collection Time: 07/22/21  1:53 PM   Specimen: Nasopharyngeal Swab  Result Value Ref Range Status   SARS Coronavirus 2 NEGATIVE NEGATIVE Final    Comment: (NOTE) SARS-CoV-2 target nucleic acids are NOT DETECTED.  The SARS-CoV-2 RNA is generally detectable in upper and lower respiratory specimens during the acute phase of infection. Negative results do not preclude SARS-CoV-2 infection, do not rule out co-infections with other pathogens, and should not be used as the sole basis for treatment or other patient management decisions. Negative results must be combined with clinical observations, patient history, and epidemiological information. The expected result is Negative.  Fact Sheet for Patients: HairSlick.no  Fact Sheet for Healthcare Providers: quierodirigir.com  This test is not yet approved or cleared by the [place] FDA and  has been authorized for detection and/or diagnosis of SARS-CoV-2 by FDA under an Emergency Use Authorization (EUA). This EUA will remain  in effect (meaning this test can be used) for the duration of the COVID-19 declaration under Se ction 564(b)(1) of the Act, 21 U.S.C. section 360bbb-3(b)(1), unless the authorization is terminated or revoked sooner.  Performed at [facility], [address], [place], [place] [address]   Resp Panel by RT-PCR (Flu A&B, Covid) Nasopharyngeal Swab     Status: None   Collection Time: 07/23/21  7:33 PM   Specimen:  Nasopharyngeal Swab; Nasopharyngeal(NP) swabs in vial transport medium  Result Value Ref Range Status   SARS Coronavirus 2 by RT PCR NEGATIVE NEGATIVE Final    Comment: (NOTE) SARS-CoV-2 target nucleic acids are NOT DETECTED.  The SARS-CoV-2 RNA is generally detectable in upper respiratory specimens during the acute phase of infection. The lowest concentration of SARS-CoV-2 viral copies this assay can detect is 138 copies/mL. A negative result does not preclude SARS-Cov-2 infection and should not be used as the sole basis for treatment or other patient management decisions. A negative result may occur with  improper specimen collection/handling, submission of specimen other than nasopharyngeal swab, presence of viral mutation(s) within the areas targeted by this assay, and inadequate number of viral copies(<138 copies/mL). A negative result must be combined with clinical observations, patient history, and epidemiological information. The expected result is Negative.  Fact Sheet for Patients:  BloggerCourse.com  Fact Sheet for Healthcare Providers:  SeriousBroker.it  This test  is no t yet approved or cleared by the [place] and  has been authorized for detection and/or diagnosis of SARS-CoV-2 by FDA under an Emergency Use Authorization (EUA). This EUA will remain  in effect (meaning this test can be used) for the duration of the COVID-19 declaration under Section 564(b)(1) of the Act, 21 U.S.C.section 360bbb-3(b)(1), unless the authorization is terminated  or revoked sooner.       Influenza A by PCR NEGATIVE NEGATIVE Final   Influenza B by PCR NEGATIVE NEGATIVE Final    Comment: (NOTE) The Xpert Xpress SARS-CoV-2/FLU/RSV plus assay is intended as an aid in the diagnosis of influenza from Nasopharyngeal swab specimens and should not be used as a sole basis for treatment. Nasal washings and aspirates are unacceptable for  Xpert Xpress SARS-CoV-2/FLU/RSV testing.  Fact Sheet for Patients: BloggerCourse.com  Fact Sheet for Healthcare Providers: SeriousBroker.it  This test is not yet approved or cleared by the [place] FDA and has been authorized for detection and/or diagnosis of SARS-CoV-2 by FDA under an Emergency Use Authorization (EUA). This EUA will remain in effect (meaning this test can be used) for the duration of the COVID-19 declaration under Section 564(b)(1) of the Act, 21 U.S.C. section 360bbb-3(b)(1), unless the authorization is terminated or revoked.  Performed at [facility], [address], [place], [place] [address]   Blood Culture (routine x 2)     Status: None   Collection Time: 07/23/21  7:45 PM   Specimen: BLOOD  Result Value Ref Range Status   Specimen Description BLOOD RIGHT ARM  Final   Special Requests   Final    BOTTLES DRAWN AEROBIC AND ANAEROBIC Blood Culture results may not be optimal due to an inadequate volume of blood received in culture bottles   Culture   Final    NO GROWTH 5 DAYS Performed at [facility], [address], [place], [place] [address]    Report Status 07/28/2021 FINAL  Final  Blood Culture (routine x 2)     Status: None   Collection Time: 07/23/21  7:50 PM   Specimen: BLOOD  Result Value Ref Range Status   Specimen Description BLOOD RIGHT FOREARM  Final   Special Requests   Final    BOTTLES DRAWN AEROBIC AND ANAEROBIC Blood Culture results may not be optimal due to an inadequate volume of blood received in culture bottles   Culture   Final    NO GROWTH 5 DAYS Performed at [facility], [address], [place], [place] [address]    Report Status 07/28/2021 FINAL  Final  Urine Culture     Status: None   Collection Time: 07/23/21 10:34 PM   Specimen: In/Out Cath Urine  Result Value Ref Range Status   Specimen Description IN/OUT CATH URINE  Final   Special Requests NONE   Final   Culture   Final    NO GROWTH Performed at [facility], [address], [place], [place] [address]    Report Status 07/25/2021 FINAL  Final  MRSA Next Gen by PCR, Nasal     Status: Abnormal   Collection Time: 07/24/21  7:04 PM   Specimen: Nasal Mucosa; Nasal Swab  Result Value Ref Range Status   MRSA by PCR Next Gen DETECTED (A) NOT DETECTED Final    Comment: RESULT CALLED TO, READ BACK BY AND VERIFIED WITH: A,PUCKETT @2254  07/24/21 EB (NOTE) The GeneXpert MRSA Assay (FDA approved for NASAL specimens  only), is one component of a comprehensive MRSA colonization surveillance program. It is not intended to diagnose MRSA infection nor to guide or monitor treatment for MRSA infections. Test performance is not FDA approved in patients less than 56 years old. Performed at [facility], [address], [place], [place] [address]          Radiology Studies: DG Chest Port 1 View  Result Date: 08/01/2021 CLINICAL DATA:  Dyspnea. EXAM: PORTABLE CHEST 1 VIEW COMPARISON:  Chest radiograph dated 07/25/2021. FINDINGS: Evaluation is limited due to patient's positioning. There is elevation of the left hemidiaphragm. Left lung base hazy densities may represent atelectasis. Developing infiltrate is not excluded clinical correlation is recommended. There is no pleural effusion pneumothorax. Stable cardiac silhouette. Atherosclerotic calcification of the aorta. Degenerative changes of the spine and shoulders. No acute osseous pathology. IMPRESSION: Elevated left hemidiaphragm with left lung base atelectasis. Developing infiltrate is not excluded. Electronically Signed   By: Elgie Collard M.D.   On: 08/01/2021 02:37        Scheduled Meds:  amantadine  100 mg Per Tube BID   amLODipine  10 mg Per Tube Daily   chlorhexidine  15 mL Mouth Rinse BID   ciprofloxacin  1 drop Both Eyes TID   enoxaparin (LOVENOX) injection  40 mg Subcutaneous Daily   feeding supplement (PROSource TF)   90 mL Per Tube BID   free water  200 mL Per Tube Q8H   insulin aspart  0-9 Units Subcutaneous Q4H   mouth rinse  15 mL Mouth Rinse q12n4p   metoCLOPramide  10 mg Per Tube TID AC & HS   metoprolol tartrate  12.5 mg Per Tube BID   mirtazapine  7.5 mg Per Tube QHS   pantoprazole sodium  40 mg Per Tube Daily   QUEtiapine  50 mg Per Tube QHS   sertraline  50 mg Per Tube Daily   Zinc Oxide   Topical BID   Continuous Infusions:  sodium chloride Stopped (07/28/21 2105)   feeding supplement (NEPRO CARB STEADY) 1,000 mL (07/31/21 2100)     LOS: 9 days   Time spent: , Greater than 50% of this time was spent in counseling, explanation of diagnosis, planning of further management, and coordination of care.   Voice Recognition Reubin Milan dictation system was used to create this note, attempts have been made to correct errors. Please contact the author with questions and/or clarifications.   Albertine Grates, MD PhD FACP [facility]  Available via Epic secure chat 7am-7pm for nonurgent issues Please page for urgent issues To page the attending provider between 7A-7P or the covering provider during after hours 7P-7A, please log into the web site [email or web address] and access using universal [place] password for that web site. If you do not have the password, please call the hospital operator.    08/01/2021, 10:35 AM

## 2021-08-01 NOTE — Progress Notes (Signed)
Pt has shallow breathing & increase resp. The charge nurse advise he looks different than the night before. Notified the provider.

## 2021-08-01 NOTE — Progress Notes (Signed)
[facility] night shift PCU coverage note.  The nursing staff reported that the patient has been tachypneic in the mid to high 20s. He is not tachycardic and has not been febrile.  He has a history of anoxic encephalopathy and has to required PEG tube placement.  He does not follow commands.  He is nonverbal and sometimes open his eye.  He has required a rectal tube and condom catheter.  Cardiovascular his S1-S2, RRR, no pitting edema on extremities.  Respiratory he is at times tachypneic in the low to mid 20s without any significant change on his O2 saturation.  He has good air movement bilaterally, except for LLL f field.  No wheezing, no rhonchi's and no crackles.  His chest radiograph shows left hemidiaphragm elevation when compared to previous.  No further action will be taken and we will continue to monitor for the moment.  Given his clinical picture his overall prognosis is very poor.  Sanda Klein, MD.

## 2021-08-02 LAB — CBC WITH DIFFERENTIAL/PLATELET
Abs Immature Granulocytes: 0.13 10*3/uL — ABNORMAL HIGH (ref 0.00–0.07)
Basophils Absolute: 0.1 10*3/uL (ref 0.0–0.1)
Basophils Relative: 1 %
Eosinophils Absolute: 0.5 10*3/uL (ref 0.0–0.5)
Eosinophils Relative: 5 %
HCT: 35.8 % — ABNORMAL LOW (ref 39.0–52.0)
Hemoglobin: 11.8 g/dL — ABNORMAL LOW (ref 13.0–17.0)
Immature Granulocytes: 1 %
Lymphocytes Relative: 12 %
Lymphs Abs: 1.1 10*3/uL (ref 0.7–4.0)
MCH: 28.8 pg (ref 26.0–34.0)
MCHC: 33 g/dL (ref 30.0–36.0)
MCV: 87.3 fL (ref 80.0–100.0)
Monocytes Absolute: 0.9 10*3/uL (ref 0.1–1.0)
Monocytes Relative: 10 %
Neutro Abs: 6.8 10*3/uL (ref 1.7–7.7)
Neutrophils Relative %: 71 %
Platelets: 177 10*3/uL (ref 150–400)
RBC: 4.1 MIL/uL — ABNORMAL LOW (ref 4.22–5.81)
RDW: 16 % — ABNORMAL HIGH (ref 11.5–15.5)
WBC: 9.5 10*3/uL (ref 4.0–10.5)
nRBC: 0 % (ref 0.0–0.2)

## 2021-08-02 LAB — PROCALCITONIN: Procalcitonin: 0.2 ng/mL

## 2021-08-02 LAB — BASIC METABOLIC PANEL
Anion gap: 9 (ref 5–15)
BUN: 27 mg/dL — ABNORMAL HIGH (ref 8–23)
CO2: 21 mmol/L — ABNORMAL LOW (ref 22–32)
Calcium: 9.5 mg/dL (ref 8.9–10.3)
Chloride: 102 mmol/L (ref 98–111)
Creatinine, Ser: 1.02 mg/dL (ref 0.61–1.24)
GFR, Estimated: >60 mL/min (ref >60)
Glucose, Bld: 118 mg/dL — ABNORMAL HIGH (ref 70–99)
Potassium: 3.8 mmol/L (ref 3.5–5.1)
Sodium: 132 mmol/L — ABNORMAL LOW (ref 135–145)

## 2021-08-02 LAB — RESP PANEL BY RT-PCR (FLU A&B, COVID) ARPGX2
Influenza A by PCR: NEGATIVE
Influenza B by PCR: NEGATIVE
SARS Coronavirus 2 by RT PCR: NEGATIVE

## 2021-08-02 LAB — GLUCOSE, CAPILLARY
Glucose-Capillary: 114 mg/dL — ABNORMAL HIGH (ref 70–99)
Glucose-Capillary: 124 mg/dL — ABNORMAL HIGH (ref 70–99)
Glucose-Capillary: 135 mg/dL — ABNORMAL HIGH (ref 70–99)
Glucose-Capillary: 158 mg/dL — ABNORMAL HIGH (ref 70–99)
Glucose-Capillary: 113 mg/dL — ABNORMAL HIGH (ref 70–99)

## 2021-08-02 LAB — LACTIC ACID, PLASMA: Lactic Acid, Venous: 1.3 mmol/L (ref 0.5–1.9)

## 2021-08-02 MED ORDER — AMANTADINE HCL 50 MG/5ML PO SOLN: 100.0000 mg | Freq: Two times a day (BID) | ORAL | Status: AC

## 2021-08-02 MED ORDER — FREE WATER: 200.0000 mL | Freq: Three times a day (TID) | Status: DC

## 2021-08-02 MED ORDER — AMLODIPINE BESYLATE 10 MG PO TABS: 10.0000 mg | ORAL_TABLET | Freq: Every day | ORAL | Status: DC

## 2021-08-02 MED ORDER — CHLORHEXIDINE GLUCONATE 0.12 % MT SOLN: 15.0000 mL | Freq: Two times a day (BID) | OROMUCOSAL | 0 refills | Status: AC

## 2021-08-02 MED ORDER — METOCLOPRAMIDE HCL 10 MG PO TABS: 10.0000 mg | ORAL_TABLET | Freq: Three times a day (TID) | ORAL | Status: AC

## 2021-08-02 MED ORDER — INSULIN ASPART 100 UNIT/ML IJ SOLN: 0.0000 [IU] | INTRAMUSCULAR | 0 refills | Status: DC

## 2021-08-02 MED ORDER — MIRTAZAPINE 7.5 MG PO TABS: 7.5000 mg | ORAL_TABLET | Freq: Every day | ORAL | Status: AC

## 2021-08-02 MED ORDER — PANTOPRAZOLE SODIUM 40 MG PO PACK: 40.0000 mg | PACK | Freq: Every day | ORAL | Status: AC

## 2021-08-02 MED ORDER — NEPRO/CARBSTEADY PO LIQD: 1000.0000 mL | ORAL | 0 refills | Status: AC

## 2021-08-02 MED ORDER — QUETIAPINE FUMARATE 50 MG PO TABS: 50.0000 mg | ORAL_TABLET | Freq: Every day | ORAL | Status: AC

## 2021-08-02 MED ORDER — IPRATROPIUM-ALBUTEROL 0.5-2.5 (3) MG/3ML IN SOLN: 3.0000 mL | Freq: Three times a day (TID) | RESPIRATORY_TRACT | Status: AC

## 2021-08-02 MED ORDER — ZINC OXIDE 12.8 % EX OINT: TOPICAL_OINTMENT | Freq: Two times a day (BID) | CUTANEOUS | 0 refills | Status: DC

## 2021-08-02 MED ORDER — METOPROLOL TARTRATE 25 MG PO TABS: 25.0000 mg | ORAL_TABLET | Freq: Two times a day (BID) | ORAL | Status: DC

## 2021-08-02 MED ORDER — PROSOURCE TF PO LIQD: 90.0000 mL | Freq: Two times a day (BID) | ORAL | Status: DC

## 2021-08-02 NOTE — TOC Transition Note (Signed)
Transition of Care [facility]) - CM/SW Discharge Note   Patient Details  Name: Benjamin Barnett MRN: [id number] Date of Birth: 1946-03-29  [facility] CM/SW Contact:  Patrice Paradise, LCSW Phone Number: [phone] 08/02/2021, 4:36 PM   Clinical Narrative:     Patient will DC to:?[facility] Anticipated DC date:?08/02/2021 Family notified:?Amanda Transport by: Sharin Mons   Per MD patient ready for DC to [facility]. RN, patient, patient's family, and facility notified of DC. Discharge Summary sent to facility. RN given number for report  [phone] room 111. DC packet on chart. Ambulance transport requested for patient.   CSW signing off.   Judd Lien, [place] [phone]   Final next level of care: Skilled Nursing Facility Barriers to Discharge: Barriers Resolved   Patient Goals and CMS Choice   CMS Medicare.gov Compare Post Acute Care list provided to:: Patient Represenative (must comment) (Patients daughter Marchelle Folks) Choice offered to / list presented to : Adult Children Marchelle Folks)  Discharge Placement              Patient [facility] bed at: [facility] Patient to be transferred to facility by: [facility] Name of family member notified: Marchelle Folks Patient and family notified of of transfer: 08/02/21  Discharge Plan and Services In-house Referral: Clinical Social Work                                   Social Determinants of Health (SDOH) Interventions     Readmission Risk Interventions No flowsheet data found.

## 2021-08-02 NOTE — Discharge Summary (Addendum)
Discharge Summary  Benjamin Barnett ZOX:[id number] DOB: Nov 10, 1946  PCP: Eloisa Northern, MD  Admit date: 07/23/2021 Discharge date: 08/02/2021  Time spent: , more than 50% time spent on coordination of care.  Recommendations for Outpatient Follow-up:  F/u with SNF MD for hospital discharge follow up, repeat cbc/bmp at follow up  Palliative care to continue follow patient at SNF  Discharge Diagnoses:  Active Hospital Problems   Diagnosis Date Noted   Sepsis (HCC) 07/23/2021   Pressure injury of skin 07/27/2021   Acute suppurative parotitis 07/24/2021   Acute metabolic encephalopathy 07/24/2021   Protein calorie malnutrition (HCC) 07/24/2021   GERD without esophagitis 07/24/2021   Lactic acidosis 07/24/2021   Hypokalemia 07/24/2021   Essential hypertension 07/24/2021    Resolved Hospital Problems   Diagnosis Date Noted Date Resolved   History of MRSA infection of lungs 07/24/2021 07/24/2021    Discharge Condition: stable  Diet recommendation: NPO, nutrition/meds per tube  [facility] Weights   07/23/21 2059 07/24/21 1906  Weight: 96.6 kg 91.1 kg    History of present illness: ( per admitting MD Dr Leafy Half) 75 year old male with past medical history of hypertension, hyperlipidemia, gout and gastroesophageal reflux disease with PEA arrest in 05/2021 resulting in recent prolonged hospitalization including prolonged stay at an LTAC who is 2 [facility] emergency department today after being transferred from [facility] to [facility] health care skilled nurse facility and found to be extremely lethargic and minimally responsive.  Hospital Course:  Principal Problem:   Sepsis [facility]) Active Problems:   Acute suppurative parotitis   Acute metabolic encephalopathy   Protein calorie malnutrition (HCC)   GERD without esophagitis   Lactic acidosis   Hypokalemia   Essential hypertension   Pressure injury of skin  Acute hypoxic respiratory failure/sepsis due to  right facial cellulitis and acute parotitis -He was seen by ENT, there is no abscess, recommend IV antibiotic treatment -He was intubated and transferred to ICU, successfully extubated on 8/15, transferred to hospitalist service on 8/16 -Is treated with Zyvox and Unasyn since admission, last dose scheduled on 8/17, blood culture no growth -P8/19-8/20night patient noted  to have  tachypnea, chest x-ray obtained overnight concerning of developing pneumonia in the right lower lobe  lactic acid level ,procalcitonin  level and CBC, reassuring -on room air, maintaining airway - schedule nebs to help aeration, he can not use incentive spirometer, respiratory pattern could be due to anoxic brain injury although he does has continued risk of developing pneumonia    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 -mri brain in 7/14 no acute findings - EEG no seizure spikes    Dysphagia S/p peg tube placement, with loose stool, d/c miralax     HTN: Blood pressure improved on  Norvasc and lopressor Continue titrate blood pressure meds as needed   Hypokalemia/hypomagnesemia Replaced K and mag, improved   AKI on CKD 2 Creatinine peaked at 1.57 Creatinine improved , today cr is 1  on 8/21 Monitor Renal dosing meds     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  I agree with the assessment and plan as outlined below: Nutrition Status: Nutrition Problem: Inadequate oral intake Etiology: inability to eat Signs/Symptoms: NPO status Interventions: Tube feeding, Prostat   Abrasion on the RLE just above the malleolus  Continue  mupirocin ointment for another 28 days. May top with silicone foam dressing or dry gauze secured with a few turns of Kerlix roll gauze/paper tape depending on supply available in the next  care setting.    MASD/IAD to the intergluteal cleft and groin area around the scrotum Clean the sacral area with no rinse cleanser, pat dry and apply triple paste to the areas of irritation twice daily or PRN soiling.  Pressure Injury Prevention Bundle Support surfaces (air mattress) chair cushion  Heel offloading boots  Turning and Positioning  Measures to reduce shear (draw sheet, knees up) Skin protection Products (Foam dressing) Moisture management products (Critic-Aid Barrier Cream (Purple top) Nutrition Management Protection for Medical Devices Routine Skin Assessment     Code Status:DNR Family Communication: Daughter over the phone on 8/17 , 8/20 and 8/21    Consultants:  Critical care ENT Palliative care   Procedures:  Intubation/extubation    Antimicrobials:    Anti-infectives (From admission, onward)    Start     Dose/Rate Route Frequency Ordered Stop   07/28/21 2200  linezolid (ZYVOX) IVPB 600 mg  Status:  Discontinued        600 mg 300 mL/hr over 60 Minutes Intravenous Every 12 hours 07/28/21 1118 07/28/21 1127   07/28/21 1127  linezolid (ZYVOX) IVPB 600 mg        600 mg 300 mL/hr over 60 Minutes Intravenous Every 12 hours 07/28/21 1127 07/30/21 0729   07/26/21 1000  linezolid (ZYVOX) IVPB 600 mg  Status:  Discontinued        600 mg 300 mL/hr over 60 Minutes Intravenous Every 12 hours 07/26/21 0829 07/28/21 1118   07/24/21 2200  vancomycin (VANCOREADY) IVPB 1250 mg/250 mL  Status:  Discontinued        1,250 mg 166.7 mL/hr over 90 Minutes Intravenous Every 24 hours 07/23/21 2201 07/26/21 0829   07/24/21 0600  ceFEPIme (MAXIPIME) 2 g in sodium chloride 0.9 % 100 mL IVPB  Status:  Discontinued        2 g 200 mL/hr over 30 Minutes Intravenous Every 12 hours 07/23/21 2115 07/24/21 0205   07/24/21 0230  Ampicillin-Sulbactam (UNASYN) 3 g in sodium chloride 0.9 % 100 mL IVPB        3 g 200 mL/hr over 30 Minutes Intravenous Every 8 hours 07/24/21 0211 07/30/21  0729   07/23/21 2130  vancomycin (VANCOREADY) IVPB 1250 mg/250 mL  Status:  Discontinued        1,250 mg 166.7 mL/hr over 90 Minutes Intravenous Every 24 hours 07/23/21 2115 07/23/21 2201   07/23/21 2125  vancomycin (VANCOREADY) IVPB 2000 mg/400 mL        2,000 mg 200 mL/hr over 120 Minutes Intravenous  Once 07/23/21 1933 07/23/21 2338   07/23/21 1945  ceFEPIme (MAXIPIME) 2 g in sodium chloride 0.9 % 100 mL IVPB        2 g 200 mL/hr over 30 Minutes Intravenous  Once 07/23/21 1933 07/23/21 2032   07/23/21 1945  metroNIDAZOLE (FLAGYL) IVPB 500 mg        500 mg 100 mL/hr over 60 Minutes Intravenous  Once 07/23/21 1933 07/23/21 2102       Discharge Exam: BP (!) 170/80 (BP Location: Left Arm)   Pulse 94   Temp 97.7 F (36.5 C) (Axillary)   Resp (!) 24   Ht  (1.778 m)   Wt 91.1 kg   SpO2 100%   BMI 28.82 kg/m    General exam:  Open eyes spontaneously, does not follow commands, + peg tube, + rectal tube, + condom catheter  Respiratory system: diminished at basis, no wheezing, no rales, no rhonchi.  Tachypneic respiration rate in high 20s  Cardiovascular system: S1 & S2 heard, RRR, + murmur Gastrointestinal system: + peg tube, Abdomen is nondistended, soft and nontender.  Normal bowel sounds heard. Central nervous system: Open eyes spontaneously, does not follow command Extremities: No edema Skin: right ankle wound does not appear infected  Psychiatry: does not follow command, currently no agitation   Discharge Instructions You were cared for by a hospitalist during your hospital stay. If you have any questions about your discharge medications or the care you received while you were in the hospital after you are discharged, you can call the unit and asked to speak with the hospitalist on call if the hospitalist that took care of you is not available. Once you are discharged, your primary care physician will handle any further medical issues. Please note that NO REFILLS for any  discharge medications will be authorized once you are discharged, as it is imperative that you return to your primary care physician (or establish a relationship with a primary care physician if you do not have one) for your aftercare needs so that they can reassess your need for medications and monitor your lab values.  Discharge Instructions     Discharge wound care:   Complete by: As directed    Pressure offloading measures   Increase activity slowly   Complete by: As directed       Allergies as of 08/02/2021       Reactions   Codeine Rash   Furosemide Rash   Ibuprofen Rash   Meperidine Rash   Tape Other (See Comments)   Other reaction(s): Redness        Medication List     STOP taking these medications    amantadine 100 MG capsule Commonly known as: SYMMETREL Replaced by: amantadine 50 MG/5ML solution   Cetirizine HCl 10 MG Caps   ciprofloxacin 0.3 % ophthalmic solution Commonly known as: CILOXAN   modafinil 100 MG tablet Commonly known as: PROVIGIL   multivitamin tablet   polyethylene glycol 17 g packet Commonly known as: MIRALAX / GLYCOLAX   Pro-Stat Liqd Replaced by: feeding supplement (PROSource TF) liquid   sertraline 50 MG tablet Commonly known as: ZOLOFT       TAKE these medications    amantadine 50 MG/5ML solution Commonly known as: SYMMETREL Place 10 mLs (100 mg total) into feeding tube 2 (two) times daily. Replaces: amantadine 100 MG capsule   amLODipine 10 MG tablet Commonly known as: NORVASC Place 1 tablet (10 mg total) into feeding tube daily. Start taking on: August 03, 2021 What changed:  medication strength how much to take how to take this   carboxymethylcellulose 0.5 % Soln Commonly known as: REFRESH PLUS Apply 1 drop to eye 3 (three) times daily as needed (dry eyes).   chlorhexidine 0.12 % solution Commonly known as: PERIDEX 15 mLs by Mouth Rinse route 2 (two) times daily.   feeding supplement (PROSource TF)  liquid Place 90 mLs into feeding tube 2 (two) times daily. Replaces: Pro-Stat Liqd   feeding supplement (NEPRO CARB STEADY) Liqd Place 1,000 mLs into feeding tube continuous.   free water Soln Place 200 mLs into feeding tube every 8 (eight) hours.   insulin aspart 100 UNIT/ML injection Commonly known as: novoLOG Inject 0-9 Units into the skin every 4 (four)  hours. Before each meal 3 times a day, 140-199 - 2 units, 200-250 - 4 units, 251-299 - 6 units,  300-349 - 8 units,  350 or above 10 units. Insulin PEN if approved, provide syringes and needles if needed.   ipratropium-albuterol 0.5-2.5 (3) MG/3ML Soln Commonly known as: DUONEB Take 3 mLs by nebulization 3 (three) times daily.   metoCLOPramide 10 MG tablet Commonly known as: REGLAN Place 1 tablet (10 mg total) into feeding tube 4 (four) times daily -  before meals and at bedtime. What changed:  how to take this when to take this   metoprolol tartrate 25 MG tablet Commonly known as: LOPRESSOR Place 1 tablet (25 mg total) into feeding tube 2 (two) times daily. What changed: how to take this   mirtazapine 7.5 MG tablet Commonly known as: REMERON Place 1 tablet (7.5 mg total) into feeding tube at bedtime. What changed:  medication strength how to take this   pantoprazole sodium 40 mg Pack Commonly known as: PROTONIX Place 20 mLs (40 mg total) into feeding tube daily. Start taking on: August 03, 2021 What changed: how to take this   QUEtiapine 50 MG tablet Commonly known as: SEROQUEL Place 1 tablet (50 mg total) into feeding tube at bedtime. What changed: how to take this   Zinc Oxide 12.8 % ointment Commonly known as: TRIPLE PASTE Apply topically 2 (two) times daily.               Discharge Care Instructions  (From admission, onward)           Start     Ordered   08/02/21 0000  Discharge wound care:       Comments: Pressure offloading measures   08/02/21 1003           Allergies  Allergen  Reactions   Codeine Rash   Furosemide Rash   Ibuprofen Rash   Meperidine Rash   Tape Other (See Comments)    Other reaction(s): Redness      The results of significant diagnostics from this hospitalization (including imaging, microbiology, ancillary and laboratory) are listed below for reference.    Significant Diagnostic Studies: CT HEAD WO CONTRAST ( )  Result Date: 07/23/2021 CLINICAL DATA:  Mental status change EXAM: CT HEAD WITHOUT CONTRAST TECHNIQUE: Contiguous axial images were obtained from the base of the skull through the vertex without intravenous contrast. COMPARISON:  CT brain 06/23/2021, MRI 06/25/2021 FINDINGS: Brain: No acute territorial infarction, hemorrhage or intracranial mass. Mild atrophy. Stable ventricular enlargement. Mild chronic small vessel ischemic changes of the white matter. Vascular: No hyperdense vessels.  Carotid vascular calcification. Skull: Normal. Negative for fracture or focal lesion. Sinuses/Orbits: No acute finding. Other: None IMPRESSION: 1. No CT evidence for acute intracranial abnormality. 2. Similar atrophy. Similar degree of ventricular enlargement probably due to atrophy though with normal pressure hydrocephalus also considered. This finding is not significantly changed Electronically Signed   By: Jasmine Pang M.D.   On: 07/23/2021 22:14   CT SOFT TISSUE NECK W CONTRAST  Result Date: 07/24/2021 CLINICAL DATA:  75 year old male with  Septic Parotitis. EXAM: CT NECK WITH CONTRAST TECHNIQUE: Multidetector CT imaging of the neck was performed using the standard protocol following the bolus administration of intravenous contrast. CONTRAST:  5mL OMNIPAQUE IOHEXOL 350 MG/ML SOLN COMPARISON:  Brain MRI 06/25/2021. FINDINGS: Pharynx and larynx: There is bulky mucosal space edema extending along the right lateral wall of the pharynx to the supraglottic larynx. There is leftward midline shift of  the airway at the level of the hyoid bone (series 4, image  89). Effaced right para form sinus. The nasopharynx is relatively spared. Despite this mucosal edema the right parapharyngeal space appears only mildly inflamed. No masslike enhancement of the larynx or pharynx. Left parapharyngeal and retropharyngeal spaces remain within normal limits. Salivary glands: Bulky, enlarged, and inflamed appearing right parotid gland (series 4, image 66). This resembles the appearance of the abnormal left parotid gland from the MRI last month, although the left parotid has normalized. No discrete right parotid mass. Regional soft tissue swelling and stranding tracking from the right parotid up the temporalis muscle and into the right submandibular space. The right submandibular gland is asymmetrically enlarged and hyperenhancing. No enlargement of the right parotid duct is evident. No enlargement of the right submandibular duct. No sialolithiasis. The sublingual space appears relatively spared at this time. There is mild inflammation of the right masticator space. The left submandibular gland and parotid appear normal. Thyroid: Negative. Lymph nodes: Small but hyperenhancing right level 1 through level 3 lymph nodes appear reactive. No enlarged, cystic or necrotic lymph nodes in the neck. Vascular: The major vascular structures in the neck and at the skull base remain patent, although the right IJ is non dominant and diminutive. Bulky cervical carotid calcified plaque at the bifurcations with hemodynamically significant stenosis of the proximal left ICA on series 4, image 76. Tortuous ICAs just below the skull base. The left vertebral artery appears slightly dominant. Limited intracranial: Calcified atherosclerosis at the skull base. Stable appearance of the visible brain parenchyma. Visualized orbits: Disconjugate gaze and chronic postoperative changes to the globes, otherwise negative orbits. Mastoids and visualized paranasal sinuses: Visualized paranasal sinuses and mastoids are  clear. Skeleton: No acute dental finding. Ordinary cervical spine degeneration. Bulky upper cervical facet disease and lower cervical endplate spurring. Advanced degenerative osseous changes about the left shoulder. No acute or suspicious osseous lesion. Upper chest: Lungs appear clear with mild respiratory motion. Calcified aortic atherosclerosis. No superior mediastinal lymphadenopathy. IMPRESSION: 1. Multispatial inflammation and edema in the right face and neck, moderately involving the right lateral pharyngeal wall and the supraglottic larynx. However, no airway compromise at this time.  No associated abscess. 2. Multifocal right side sallow adenitis, with bulky and inflamed right parotid gland appearing similar to the abnormal left parotid on the brain MRI last month - although the left parotid has since normalized. Inflamed right submandibular gland. No sialolithiasis or obstructing etiology identified. 3. Small but reactive appearing right cervical lymph nodes. 4. Bulky calcified carotid plaque with hemodynamically significant proximal Left ICA stenosis. 5. Aortic Atherosclerosis (ICD10-I70.0). Electronically Signed   By: Odessa Fleming M.D.   On: 07/24/2021 07:07   DG Chest Port 1 View  Result Date: 08/01/2021 CLINICAL DATA:  Dyspnea. EXAM: PORTABLE CHEST 1 VIEW COMPARISON:  Chest radiograph dated 07/25/2021. FINDINGS: Evaluation is limited due to patient's positioning. There is elevation of the left hemidiaphragm. Left lung base hazy densities may represent atelectasis. Developing infiltrate is not excluded clinical correlation is recommended. There is no pleural effusion pneumothorax. Stable cardiac silhouette. Atherosclerotic calcification of the aorta. Degenerative changes of the spine and shoulders. No acute osseous pathology. IMPRESSION: Elevated left hemidiaphragm with left lung base atelectasis. Developing infiltrate is not excluded. Electronically Signed   By: Elgie Collard M.D.   On: 08/01/2021  02:37   DG CHEST PORT 1 VIEW  Result Date: 07/25/2021 CLINICAL DATA:  Shortness of breath EXAM: PORTABLE CHEST 1 VIEW COMPARISON:  July 24, 2021 FINDINGS: The ETT is in good position. The cardiomediastinal silhouette is stable. No pneumothorax. No pulmonary edema. Mild bibasilar opacities persist. No other interval changes. IMPRESSION: 1. Support apparatus as above. 2. Mild bibasilar opacities may represent subtle infiltrate such as pneumonia or aspiration. Recommend attention on follow-up. Electronically Signed   By: Gerome Samavid  Williams III M.D.   On: 07/25/2021 13:01   DG Chest Port 1 View  Result Date: 07/24/2021 CLINICAL DATA:  Acute respiratory failure with hypoxia. EXAM: PORTABLE CHEST 1 VIEW COMPARISON:  One-view chest x-ray 07/23/2021 and 07/06/2021. FINDINGS: The patient is now intubated. Heart size is. Atherosclerotic calcifications are present the aortic arch. Increased pulmonary vascular congestion is present. Increasing bibasilar airspace disease is present, left greater than right. Advanced degenerative changes are noted in the left shoulder. IMPRESSION: 1. Interval intubation. 2. Increased pulmonary vascular congestion. 3. Increasing bibasilar airspace disease, left greater than right. This is concerning for infection or aspiration. Electronically Signed   By: Marin Robertshristopher  Mattern M.D.   On: 07/24/2021 19:09   DG Chest Port 1 View  Result Date: 07/23/2021 CLINICAL DATA:  Altered mental status EXAM: PORTABLE CHEST 1 VIEW COMPARISON:  07/06/2021 FINDINGS: The heart size and mediastinal contours are within normal limits. Aortic atherosclerosis. Both lungs are clear. The visualized skeletal structures are unremarkable. IMPRESSION: No active disease. Electronically Signed   By: Jasmine PangKim  Fujinaga M.D.   On: 07/23/2021 20:46   DG CHEST PORT 1 VIEW  Result Date: 07/06/2021 CLINICAL DATA:  Fever. EXAM: PORTABLE CHEST 1 VIEW COMPARISON:  Single-view of the chest 06/14/2021 and 05/31/2021. FINDINGS:  The lungs are clear. Heart size is normal. Aortic atherosclerosis. No pneumothorax or pleural fluid. No acute or focal bony abnormality. IMPRESSION: No acute disease. Aortic Atherosclerosis (ICD10-I70.0). Electronically Signed   By: Drusilla Kannerhomas  Dalessio M.D.   On: 07/06/2021 14:08   EEG adult  Result Date: 07/30/2021 Charlsie QuestYadav, Priyanka O, MD     07/30/2021  8:52 AM Patient Name: Casilda CarlsWayne Paul Barnett MRN: [id number] Epilepsy Attending: Charlsie QuestPriyanka O Yadav Referring Physician/Provider: Dr Albertine GratesFang Eli Adami Date: 07/29/2021 Duration: 23.56 mins Patient history: 75 year old male with altered mental status.  EEG to evaluate for seizures. Level of alertness: Awake AEDs during EEG study: None Technical aspects: This EEG study was done with scalp electrodes positioned according to the 10-20 International system of electrode placement. Electrical activity was acquired at a sampling rate of 500Hz  and reviewed with a high frequency filter of 70Hz  and a low frequency filter of 1Hz . EEG data were recorded continuously and digitally stored. Description: EEG showed continuous generalized 5 to 6 Hz theta as well as intermittent generalized 2-3Hz  delta slowing. Hyperventilation and photic stimulation were not performed.   ABNORMALITY - Continuous slow, generalized IMPRESSION: This study is suggestive of moderate diffuse encephalopathy, nonspecific etiology. No seizures or epileptiform discharges were seen throughout the recording. Charlsie QuestPriyanka O Yadav   CT Renal Stone Study  Result Date: 07/23/2021 CLINICAL DATA:  History of retroperitoneal bleed EXAM: CT ABDOMEN AND PELVIS WITHOUT CONTRAST TECHNIQUE: Multidetector CT imaging of the abdomen and pelvis was performed following the standard protocol without IV contrast. COMPARISON:  CT 06/21/1999 IV FINDINGS: Lower chest: Lung bases demonstrate no acute consolidation or effusion. Coronary vascular calcification. Hepatobiliary: No focal liver abnormality is seen. Status post cholecystectomy. No biliary  dilatation. Pancreas: Unremarkable. No pancreatic ductal dilatation or surrounding inflammatory changes. Spleen: Normal in size without focal abnormality. Adrenals/Urinary Tract: Adrenal glands are normal. 9 cm right renal cyst. Small nonobstructing stone in the right kidney.  No hydronephrosis. 10 mm stone in the bladder. Bladder slightly thick walled Stomach/Bowel: Stomach contains percutaneous gastrostomy tube. No dilated small bowel. No acute bowel wall thickening. Negative appendix. Diverticular disease of the left colon. Vascular/Lymphatic: Mild aortic atherosclerosis. No aneurysm. No suspicious nodes Reproductive: Prostate is unremarkable. Other: Negative for free air or free fluid. Fat containing left inguinal hernia Musculoskeletal: Degenerative changes of the spine. Residual asymmetric enlargement of the right greater than left psoas consistent with resolving retroperitoneal hematoma. This has decreased compared to the prior exam. IMPRESSION: 1. No significant hydronephrosis.  10 mm stone in the bladder. 2. Residual asymmetric enlargement of right greater than left psoas muscle though decreased compared to prior CT from July, consistent with resolving retroperitoneal hematoma. 3. 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 4. Nonobstructing right kidney stone Electronically Signed   By: Jasmine Pang M.D.   On: 07/23/2021 22:01    Microbiology: Recent Results (from the past 240 hour(s))  Resp Panel by RT-PCR (Flu A&B, Covid) Nasopharyngeal Swab     Status: None   Collection Time: 07/23/21  7:33 PM   Specimen: Nasopharyngeal Swab; Nasopharyngeal(NP) swabs in vial transport medium  Result Value Ref Range Status   SARS Coronavirus 2 by RT PCR NEGATIVE NEGATIVE Final    Comment: (NOTE) SARS-CoV-2 target nucleic acids are NOT DETECTED.  The SARS-CoV-2 RNA is generally detectable in upper respiratory specimens during the acute phase of infection. The lowest concentration of  SARS-CoV-2 viral copies this assay can detect is 138 copies/mL. A negative result does not preclude SARS-Cov-2 infection and should not be used as the sole basis for treatment or other patient management decisions. A negative result may occur with  improper specimen collection/handling, submission of specimen other than nasopharyngeal swab, presence of viral mutation(s) within the areas targeted by this assay, and inadequate number of viral copies(<138 copies/mL). A negative result must be combined with clinical observations, patient history, and epidemiological information. The expected result is Negative.  Fact Sheet for Patients:  BloggerCourse.com  Fact Sheet for Healthcare Providers:  SeriousBroker.it  This test is no t yet approved or cleared by the [place] FDA and  has been authorized for detection and/or diagnosis of SARS-CoV-2 by FDA under an Emergency Use Authorization (EUA). This EUA will remain  in effect (meaning this test can be used) for the duration of the COVID-19 declaration under Section 564(b)(1) of the Act, 21 U.S.C.section 360bbb-3(b)(1), unless the authorization is terminated  or revoked sooner.       Influenza A by PCR NEGATIVE NEGATIVE Final   Influenza B by PCR NEGATIVE NEGATIVE Final    Comment: (NOTE) The Xpert Xpress SARS-CoV-2/FLU/RSV plus assay is intended as an aid in the diagnosis of influenza from Nasopharyngeal swab specimens and should not be used as a sole basis for treatment. Nasal washings and aspirates are unacceptable for Xpert Xpress SARS-CoV-2/FLU/RSV testing.  Fact Sheet for Patients: BloggerCourse.com  Fact Sheet for Healthcare Providers: SeriousBroker.it  This test is not yet approved or cleared by the [place] FDA and has been authorized for detection and/or diagnosis of SARS-CoV-2 by FDA under an Emergency Use  Authorization (EUA). This EUA will remain in effect (meaning this test can be used) for the duration of the COVID-19 declaration under Section 564(b)(1) of the Act, 21 U.S.C. section 360bbb-3(b)(1), unless the authorization is terminated or revoked.  Performed at [facility], [address] [place], [place] [address]   Blood Culture (routine x 2)  Status: None   Collection Time: 07/23/21  7:45 PM   Specimen: BLOOD  Result Value Ref Range Status   Specimen Description BLOOD RIGHT ARM  Final   Special Requests   Final    BOTTLES DRAWN AEROBIC AND ANAEROBIC Blood Culture results may not be optimal due to an inadequate volume of blood received in culture bottles   Culture   Final    NO GROWTH 5 DAYS Performed at [facility], [address] [place], [place] [address]    Report Status 07/28/2021 FINAL  Final  Blood Culture (routine x 2)     Status: None   Collection Time: 07/23/21  7:50 PM   Specimen: BLOOD  Result Value Ref Range Status   Specimen Description BLOOD RIGHT FOREARM  Final   Special Requests   Final    BOTTLES DRAWN AEROBIC AND ANAEROBIC Blood Culture results may not be optimal due to an inadequate volume of blood received in culture bottles   Culture   Final    NO GROWTH 5 DAYS Performed at [facility], [address], [place], [place] [address]    Report Status 07/28/2021 FINAL  Final  Urine Culture     Status: None   Collection Time: 07/23/21 10:34 PM   Specimen: In/Out Cath Urine  Result Value Ref Range Status   Specimen Description IN/OUT CATH URINE  Final   Special Requests NONE  Final   Culture   Final    NO GROWTH Performed at [facility], [address], [place], [place] [address]    Report Status 07/25/2021 FINAL  Final  MRSA Next Gen by PCR, Nasal     Status: Abnormal   Collection Time: 07/24/21  7:04 PM   Specimen: Nasal Mucosa; Nasal Swab  Result Value Ref Range Status   MRSA by PCR Next Gen DETECTED (A) NOT  DETECTED Final    Comment: RESULT CALLED TO, READ BACK BY AND VERIFIED WITH: A,PUCKETT  07/24/21 EB (NOTE) The GeneXpert MRSA Assay (FDA approved for NASAL specimens only), is one component of a comprehensive MRSA colonization surveillance program. It is not intended to diagnose MRSA infection nor to guide or monitor treatment for MRSA infections. Test performance is not FDA approved in patients less than 2 years old. Performed at [facility], [address] [place], [place] [address]      Labs: Basic Metabolic Panel: Recent Labs  Lab 07/27/21 0625 07/28/21 0013 07/28/21 1443 07/29/21 0201 07/30/21 0337 08/02/21 0319  NA 139 138 141 136 132* 132*  K 3.8 3.1* 3.3* 3.2* 3.9 3.8  CL 107 109  --  104 99 102  CO2 26 20*  --  23 24 21*  GLUCOSE 101* 105*  --  104* 99 118*  BUN 44* 37*  --  25* 19 27*  CREATININE 1.31* 1.10  --  1.00 0.96 1.02  CALCIUM 9.2 8.9  --  8.8* 9.4 9.5  MG  --   --   --  1.6* 2.0  --   PHOS 3.0 1.1*  --   --   --   --    Liver Function Tests: Recent Labs  Lab 07/27/21 0625 07/28/21 0013  ALBUMIN 2.4* 2.5*   No results for input(s): LIPASE, AMYLASE in the last 168 hours. Recent Labs  Lab 07/30/21 0337  AMMONIA 22   CBC: Recent Labs  Lab 07/27/21 0625 07/28/21 0013 07/28/21 1443 07/29/21 0201 08/01/21 1054 08/02/21 0319  WBC 9.7 11.6*  --  8.7 9.9 9.5  NEUTROABS  --   --   --  5.9 6.8 6.8  HGB 9.7* 10.2* 10.5* 10.6* 11.6* 11.8*  HCT 31.0* 33.1* 31.0* 32.3* 34.9* 35.8*  MCV 92.3 93.0  --  88.5 88.1 87.3  PLT 163 153  --  148* 189 177   Cardiac Enzymes: No results for input(s): CKTOTAL, CKMB, CKMBINDEX, TROPONINI in the last 168 hours. BNP: BNP (last 3 results) No results for input(s): BNP in the last 8760 hours.  ProBNP (last 3 results) No results for input(s): PROBNP in the last 8760 hours.  CBG: Recent Labs  Lab 08/01/21 2117 08/02/21 0139 08/02/21 0405 08/02/21 0801 08/02/21 1132  GLUCAP 109* 114* 124*  135* 158*       Signed:  Albertine Grates MD, PhD, FACP  [facility] 08/02/2021, 12:00 PM

## 2021-08-02 NOTE — Consult Note (Signed)
WOC Nurse Consult Note: Reason for Consult: Reconsulted for wound on right LE that was perceived to be worsening by staff RN Wound type: Full thickness abrasion seen by my associate Ivonne Andrew on 8/15.  At that time, mupirocin ointment was ordered and topped with foam dressing.  Pressure Injury POA: N/A Measurement: 3cm x 2.2cm with depth unable to measure due to the presence of nonviable tissue Wound bed: The nonviable tissue is pulling away from the wound edge and dissolving via autolysis. This is an expected and predictable finding in the wound healing trajectory. Drainage (amount, consistency, odor) consistent with autolytic debridement, light yellow to brown Periwound: intact, mild erythema Dressing procedure/placement/frequency: No change in the POC is required.  Continue mupirocin ointment for another 28 days. May top with silicone foam dressing or dry gauze secured with a few turns of Kerlix roll gauze/paper tape depending on supply available in the next care setting.    The above was communicated to Dr. Roda Shutters via Secure Chat, who placed a photo in the patient's EMR today at my request.  Her assistance is appreciated.  Patient is to discharge today to a local SNF where treatment will continue.  WOC nursing team will not follow, but will remain available to this patient, the nursing and medical teams.  Please re-consult if needed. Thanks, Ladona Mow, MSN, RN, GNP, [facility]  Pager# [phone]

## 2021-08-02 NOTE — Progress Notes (Signed)
Report given to [employer] at [facility]. Patient is being admitted to room # 111, phone number called for report [phone]. Report given, questions answered, notified that patient is a DNR and paperwork will be included. Left phone number with Debbe Odea [phone] if there are any further question. Debbe Odea aware that Sharin Mons has been called already.

## 2021-08-11 ENCOUNTER — Encounter (HOSPITAL_COMMUNITY): Payer: Self-pay

## 2021-08-11 ENCOUNTER — Inpatient Hospital Stay (HOSPITAL_COMMUNITY)
Admission: EM | Admit: 2021-08-11 | Discharge: 2021-08-14 | DRG: 871 | Disposition: A | Discharge status: Hospice | Payer: Medicare Other | Source: Other Acute Inpatient Hospital | Attending: Internal Medicine | Admitting: Internal Medicine

## 2021-08-11 ENCOUNTER — Emergency Department (HOSPITAL_COMMUNITY): Payer: Medicare Other

## 2021-08-11 DIAGNOSIS — E782 Mixed hyperlipidemia: Secondary | ICD-10-CM | POA: Diagnosis present

## 2021-08-11 DIAGNOSIS — K219 Gastro-esophageal reflux disease without esophagitis: Secondary | ICD-10-CM | POA: Diagnosis present

## 2021-08-11 DIAGNOSIS — Z91048 Other nonmedicinal substance allergy status: Secondary | ICD-10-CM | POA: Diagnosis not present

## 2021-08-11 DIAGNOSIS — L8942 Pressure ulcer of contiguous site of back, buttock and hip, stage 2: Secondary | ICD-10-CM | POA: Diagnosis not present

## 2021-08-11 DIAGNOSIS — Z8674 Personal history of sudden cardiac arrest: Secondary | ICD-10-CM

## 2021-08-11 DIAGNOSIS — Z79899 Other long term (current) drug therapy: Secondary | ICD-10-CM

## 2021-08-11 DIAGNOSIS — Z886 Allergy status to analgesic agent status: Secondary | ICD-10-CM

## 2021-08-11 DIAGNOSIS — E876 Hypokalemia: Secondary | ICD-10-CM | POA: Diagnosis present

## 2021-08-11 DIAGNOSIS — E46 Unspecified protein-calorie malnutrition: Secondary | ICD-10-CM | POA: Diagnosis present

## 2021-08-11 DIAGNOSIS — R627 Adult failure to thrive: Secondary | ICD-10-CM | POA: Diagnosis present

## 2021-08-11 DIAGNOSIS — G931 Anoxic brain damage, not elsewhere classified: Secondary | ICD-10-CM | POA: Diagnosis present

## 2021-08-11 DIAGNOSIS — G9340 Encephalopathy, unspecified: Secondary | ICD-10-CM | POA: Diagnosis not present

## 2021-08-11 DIAGNOSIS — G9341 Metabolic encephalopathy: Secondary | ICD-10-CM | POA: Diagnosis present

## 2021-08-11 DIAGNOSIS — R6520 Severe sepsis without septic shock: Secondary | ICD-10-CM | POA: Diagnosis not present

## 2021-08-11 DIAGNOSIS — A419 Sepsis, unspecified organism: Principal | ICD-10-CM | POA: Diagnosis present

## 2021-08-11 DIAGNOSIS — Z7401 Bed confinement status: Secondary | ICD-10-CM | POA: Diagnosis not present

## 2021-08-11 DIAGNOSIS — M109 Gout, unspecified: Secondary | ICD-10-CM | POA: Diagnosis present

## 2021-08-11 DIAGNOSIS — Z885 Allergy status to narcotic agent status: Secondary | ICD-10-CM

## 2021-08-11 DIAGNOSIS — K1120 Sialoadenitis, unspecified: Secondary | ICD-10-CM | POA: Diagnosis present

## 2021-08-11 DIAGNOSIS — N179 Acute kidney failure, unspecified: Secondary | ICD-10-CM | POA: Diagnosis present

## 2021-08-11 DIAGNOSIS — R778 Other specified abnormalities of plasma proteins: Secondary | ICD-10-CM | POA: Diagnosis present

## 2021-08-11 DIAGNOSIS — I517 Cardiomegaly: Secondary | ICD-10-CM | POA: Diagnosis not present

## 2021-08-11 DIAGNOSIS — R52 Pain, unspecified: Secondary | ICD-10-CM | POA: Diagnosis not present

## 2021-08-11 DIAGNOSIS — I469 Cardiac arrest, cause unspecified: Secondary | ICD-10-CM | POA: Diagnosis present

## 2021-08-11 DIAGNOSIS — Z888 Allergy status to other drugs, medicaments and biological substances status: Secondary | ICD-10-CM | POA: Diagnosis not present

## 2021-08-11 DIAGNOSIS — L24A0 Irritant contact dermatitis due to friction or contact with body fluids, unspecified: Secondary | ICD-10-CM | POA: Diagnosis present

## 2021-08-11 DIAGNOSIS — Z86718 Personal history of other venous thrombosis and embolism: Secondary | ICD-10-CM | POA: Diagnosis not present

## 2021-08-11 DIAGNOSIS — Z515 Encounter for palliative care: Secondary | ICD-10-CM | POA: Diagnosis not present

## 2021-08-11 DIAGNOSIS — Z931 Gastrostomy status: Secondary | ICD-10-CM

## 2021-08-11 DIAGNOSIS — R509 Fever, unspecified: Secondary | ICD-10-CM | POA: Diagnosis not present

## 2021-08-11 DIAGNOSIS — I10 Essential (primary) hypertension: Secondary | ICD-10-CM | POA: Diagnosis present

## 2021-08-11 DIAGNOSIS — Z20822 Contact with and (suspected) exposure to covid-19: Secondary | ICD-10-CM | POA: Diagnosis present

## 2021-08-11 DIAGNOSIS — Z66 Do not resuscitate: Secondary | ICD-10-CM | POA: Diagnosis present

## 2021-08-11 DIAGNOSIS — Z794 Long term (current) use of insulin: Secondary | ICD-10-CM

## 2021-08-11 DIAGNOSIS — Z7189 Other specified counseling: Secondary | ICD-10-CM | POA: Diagnosis not present

## 2021-08-11 LAB — COMPREHENSIVE METABOLIC PANEL
ALT: 13 U/L (ref 0–44)
AST: 18 U/L (ref 15–41)
Albumin: 3.2 g/dL — ABNORMAL LOW (ref 3.5–5.0)
Alkaline Phosphatase: 58 U/L (ref 38–126)
Anion gap: 9 (ref 5–15)
BUN: 28 mg/dL — ABNORMAL HIGH (ref 8–23)
CO2: 24 mmol/L (ref 22–32)
Calcium: 10.3 mg/dL (ref 8.9–10.3)
Chloride: 107 mmol/L (ref 98–111)
Creatinine, Ser: 1.19 mg/dL (ref 0.61–1.24)
GFR, Estimated: >60 mL/min (ref >60)
Glucose, Bld: 153 mg/dL — ABNORMAL HIGH (ref 70–99)
Potassium: 3.3 mmol/L — ABNORMAL LOW (ref 3.5–5.1)
Sodium: 140 mmol/L (ref 135–145)
Total Bilirubin: 0.6 mg/dL (ref 0.3–1.2)
Total Protein: 7.9 g/dL (ref 6.5–8.1)

## 2021-08-11 LAB — LACTIC ACID, PLASMA
Lactic Acid, Venous: 1.4 mmol/L (ref 0.5–1.9)
Lactic Acid, Venous: 1.1 mmol/L (ref 0.5–1.9)

## 2021-08-11 LAB — URINALYSIS, ROUTINE W REFLEX MICROSCOPIC
Bilirubin Urine: NEGATIVE
Glucose, UA: NEGATIVE mg/dL
Hgb urine dipstick: NEGATIVE
Ketones, ur: NEGATIVE mg/dL
Nitrite: NEGATIVE
Protein, ur: 30 mg/dL — AB
Specific Gravity, Urine: 1.017 (ref 1.005–1.030)
pH: 5 (ref 5.0–8.0)

## 2021-08-11 LAB — CBC WITH DIFFERENTIAL/PLATELET
Abs Immature Granulocytes: 0.08 10*3/uL — ABNORMAL HIGH (ref 0.00–0.07)
Basophils Absolute: 0.1 10*3/uL (ref 0.0–0.1)
Basophils Relative: 1 %
Eosinophils Absolute: 0.1 10*3/uL (ref 0.0–0.5)
Eosinophils Relative: 1 %
HCT: 41.9 % (ref 39.0–52.0)
Hemoglobin: 13.5 g/dL (ref 13.0–17.0)
Immature Granulocytes: 1 %
Lymphocytes Relative: 9 %
Lymphs Abs: 1.1 10*3/uL (ref 0.7–4.0)
MCH: 28.2 pg (ref 26.0–34.0)
MCHC: 32.2 g/dL (ref 30.0–36.0)
MCV: 87.5 fL (ref 80.0–100.0)
Monocytes Absolute: 1.1 10*3/uL — ABNORMAL HIGH (ref 0.1–1.0)
Monocytes Relative: 9 %
Neutro Abs: 9.9 10*3/uL — ABNORMAL HIGH (ref 1.7–7.7)
Neutrophils Relative %: 79 %
Platelets: 223 10*3/uL (ref 150–400)
RBC: 4.79 MIL/uL (ref 4.22–5.81)
RDW: 15.8 % — ABNORMAL HIGH (ref 11.5–15.5)
WBC: 12.4 10*3/uL — ABNORMAL HIGH (ref 4.0–10.5)
nRBC: 0 % (ref 0.0–0.2)

## 2021-08-11 LAB — CBG MONITORING, ED: Glucose-Capillary: 134 mg/dL — ABNORMAL HIGH (ref 70–99)

## 2021-08-11 LAB — TROPONIN I (HIGH SENSITIVITY)
Troponin I (High Sensitivity): 19 ng/L — ABNORMAL HIGH (ref <18)
Troponin I (High Sensitivity): 21 ng/L — ABNORMAL HIGH (ref <18)

## 2021-08-11 LAB — MRSA NEXT GEN BY PCR, NASAL: MRSA by PCR Next Gen: DETECTED — AB

## 2021-08-11 LAB — RESP PANEL BY RT-PCR (FLU A&B, COVID) ARPGX2
Influenza A by PCR: NEGATIVE
Influenza B by PCR: NEGATIVE
SARS Coronavirus 2 by RT PCR: NEGATIVE

## 2021-08-11 LAB — PROTIME-INR
INR: 1.1 (ref 0.8–1.2)
Prothrombin Time: 14.5 seconds (ref 11.4–15.2)

## 2021-08-11 MED ORDER — SODIUM CHLORIDE 0.9 % IV BOLUS
1000.0000 mL | Freq: Once | INTRAVENOUS | Status: AC
  Administered 2021-08-11: 1000 mL via INTRAVENOUS

## 2021-08-11 MED ORDER — AMANTADINE HCL 50 MG/5ML PO SOLN
100.0000 mg | Freq: Two times a day (BID) | ORAL | Status: DC
  Administered 2021-08-11 – 2021-08-14 (×6): 100 mg
  Filled 2021-08-11 (×7): qty 10

## 2021-08-11 MED ORDER — POLYETHYLENE GLYCOL 3350 17 G PO PACK: 17.0000 g | PACK | Freq: Every day | ORAL | Status: DC | PRN

## 2021-08-11 MED ORDER — IPRATROPIUM-ALBUTEROL 0.5-2.5 (3) MG/3ML IN SOLN: 3.0000 mL | RESPIRATORY_TRACT | Status: DC | PRN

## 2021-08-11 MED ORDER — VANCOMYCIN HCL 1750 MG/350ML IV SOLN
1750.0000 mg | Freq: Once | INTRAVENOUS | Status: AC
  Administered 2021-08-11: 1750 mg via INTRAVENOUS
  Filled 2021-08-11: qty 350

## 2021-08-11 MED ORDER — PANTOPRAZOLE SODIUM 40 MG PO PACK
40.0000 mg | PACK | Freq: Every day | ORAL | Status: DC
  Administered 2021-08-12 – 2021-08-14 (×3): 40 mg
  Filled 2021-08-11 (×3): qty 20

## 2021-08-11 MED ORDER — METOPROLOL TARTRATE 25 MG PO TABS
25.0000 mg | ORAL_TABLET | Freq: Two times a day (BID) | ORAL | Status: DC
  Administered 2021-08-11 – 2021-08-13 (×4): 25 mg
  Filled 2021-08-11 (×4): qty 1

## 2021-08-11 MED ORDER — BISACODYL 5 MG PO TBEC: 5.0000 mg | DELAYED_RELEASE_TABLET | Freq: Every day | ORAL | Status: DC | PRN

## 2021-08-11 MED ORDER — ORAL CARE MOUTH RINSE
15.0000 mL | Freq: Two times a day (BID) | OROMUCOSAL | Status: DC
  Administered 2021-08-11 – 2021-08-13 (×4): 15 mL via OROMUCOSAL

## 2021-08-11 MED ORDER — ENOXAPARIN SODIUM 40 MG/0.4ML IJ SOSY
40.0000 mg | PREFILLED_SYRINGE | INTRAMUSCULAR | Status: DC
  Administered 2021-08-11 – 2021-08-12 (×2): 40 mg via SUBCUTANEOUS
  Filled 2021-08-11 (×2): qty 0.4

## 2021-08-11 MED ORDER — IOHEXOL 350 MG/ML SOLN
75.0000 mL | Freq: Once | INTRAVENOUS | Status: AC | PRN
  Administered 2021-08-11: 75 mL via INTRAVENOUS

## 2021-08-11 MED ORDER — MUPIROCIN 2 % EX OINT
1.0000 "application " | TOPICAL_OINTMENT | Freq: Two times a day (BID) | CUTANEOUS | Status: DC
  Administered 2021-08-11 – 2021-08-14 (×6): 1 via NASAL
  Filled 2021-08-11 (×2): qty 22

## 2021-08-11 MED ORDER — QUETIAPINE FUMARATE 50 MG PO TABS
50.0000 mg | ORAL_TABLET | Freq: Every day | ORAL | Status: DC
  Administered 2021-08-11 – 2021-08-13 (×3): 50 mg
  Filled 2021-08-11 (×3): qty 1

## 2021-08-11 MED ORDER — FREE WATER
200.0000 mL | Freq: Three times a day (TID) | Status: DC
  Administered 2021-08-11 – 2021-08-13 (×5): 200 mL

## 2021-08-11 MED ORDER — CHLORHEXIDINE GLUCONATE CLOTH 2 % EX PADS
6.0000 | MEDICATED_PAD | Freq: Every day | CUTANEOUS | Status: DC
  Administered 2021-08-12 – 2021-08-13 (×2): 6 via TOPICAL

## 2021-08-11 MED ORDER — NEPRO/CARBSTEADY PO LIQD
50.0000 mL | ORAL | Status: DC
  Filled 2021-08-11: qty 237

## 2021-08-11 MED ORDER — ZINC OXIDE 12.8 % EX OINT
TOPICAL_OINTMENT | Freq: Two times a day (BID) | CUTANEOUS | Status: DC
  Administered 2021-08-11 – 2021-08-13 (×3): 1 via TOPICAL
  Filled 2021-08-11: qty 56.7

## 2021-08-11 MED ORDER — HYDRALAZINE HCL 20 MG/ML IJ SOLN: 10.0000 mg | Freq: Four times a day (QID) | INTRAMUSCULAR | Status: DC | PRN

## 2021-08-11 MED ORDER — ACETAMINOPHEN 160 MG/5ML PO SOLN: 650.0000 mg | Freq: Four times a day (QID) | ORAL | Status: DC | PRN

## 2021-08-11 MED ORDER — MIRTAZAPINE 7.5 MG PO TABS
7.5000 mg | ORAL_TABLET | Freq: Every day | ORAL | Status: DC
  Administered 2021-08-11 – 2021-08-13 (×3): 7.5 mg
  Filled 2021-08-11 (×4): qty 1

## 2021-08-11 MED ORDER — NEPRO/CARBSTEADY PO LIQD
1000.0000 mL | ORAL | Status: DC
  Administered 2021-08-11 – 2021-08-12 (×2): 1000 mL
  Filled 2021-08-11 (×4): qty 1000

## 2021-08-11 MED ORDER — AMLODIPINE BESYLATE 5 MG PO TABS
10.0000 mg | ORAL_TABLET | Freq: Every day | ORAL | Status: DC
  Administered 2021-08-11 – 2021-08-13 (×3): 10 mg
  Filled 2021-08-11 (×2): qty 2

## 2021-08-11 MED ORDER — ACETAMINOPHEN 650 MG RE SUPP: 650.0000 mg | Freq: Four times a day (QID) | RECTAL | Status: DC | PRN

## 2021-08-11 MED ORDER — ACETAMINOPHEN 650 MG RE SUPP
650.0000 mg | Freq: Once | RECTAL | Status: AC
  Administered 2021-08-11: 650 mg via RECTAL
  Filled 2021-08-11: qty 1

## 2021-08-11 MED ORDER — SODIUM CHLORIDE 0.9 % IV SOLN
1.5000 g | Freq: Four times a day (QID) | INTRAVENOUS | Status: DC
  Administered 2021-08-11 – 2021-08-14 (×9): 1.5 g via INTRAVENOUS
  Filled 2021-08-11: qty 1.5
  Filled 2021-08-11 (×2): qty 4
  Filled 2021-08-11 (×3): qty 1.5
  Filled 2021-08-11: qty 4
  Filled 2021-08-11 (×2): qty 1.5
  Filled 2021-08-11 (×3): qty 4

## 2021-08-11 MED ORDER — SODIUM CHLORIDE 0.9 % IV SOLN
2.0000 g | Freq: Once | INTRAVENOUS | Status: AC
  Administered 2021-08-11: 2 g via INTRAVENOUS
  Filled 2021-08-11: qty 2

## 2021-08-11 MED ORDER — IPRATROPIUM-ALBUTEROL 0.5-2.5 (3) MG/3ML IN SOLN
3.0000 mL | Freq: Three times a day (TID) | RESPIRATORY_TRACT | Status: DC
  Administered 2021-08-11 – 2021-08-13 (×6): 3 mL via RESPIRATORY_TRACT
  Filled 2021-08-11 (×6): qty 3

## 2021-08-11 MED ORDER — PRO-STAT SUGAR FREE PO LIQD
30.0000 mL | Freq: Two times a day (BID) | ORAL | Status: DC
  Filled 2021-08-11: qty 30

## 2021-08-11 MED ORDER — PROSOURCE TF PO LIQD
45.0000 mL | Freq: Two times a day (BID) | ORAL | Status: DC
  Administered 2021-08-11 – 2021-08-13 (×4): 45 mL
  Filled 2021-08-11 (×4): qty 45

## 2021-08-11 MED ORDER — METOCLOPRAMIDE HCL 10 MG PO TABS
10.0000 mg | ORAL_TABLET | Freq: Three times a day (TID) | ORAL | Status: DC
  Administered 2021-08-11 – 2021-08-14 (×9): 10 mg
  Filled 2021-08-11 (×8): qty 1

## 2021-08-11 MED ORDER — POTASSIUM CHLORIDE 20 MEQ PO PACK
40.0000 meq | PACK | Freq: Once | ORAL | Status: AC
  Administered 2021-08-11: 40 meq
  Filled 2021-08-11: qty 2

## 2021-08-11 MED ORDER — DEXAMETHASONE SODIUM PHOSPHATE 10 MG/ML IJ SOLN
10.0000 mg | Freq: Once | INTRAMUSCULAR | Status: AC
  Administered 2021-08-11: 10 mg via INTRAVENOUS
  Filled 2021-08-11: qty 1

## 2021-08-11 MED ORDER — DEXAMETHASONE SODIUM PHOSPHATE 4 MG/ML IJ SOLN
4.0000 mg | Freq: Two times a day (BID) | INTRAMUSCULAR | Status: DC
  Administered 2021-08-11 – 2021-08-14 (×6): 4 mg via INTRAVENOUS
  Filled 2021-08-11 (×6): qty 1

## 2021-08-11 NOTE — Progress Notes (Signed)
A consult was received from an ED physician for Vancomycin and Cefepime per pharmacy dosing.  The patient's profile has been reviewed for ht/wt/allergies/indication/available labs.   A one time order has been placed for Vancomycin 1750 mg and Cefepime 2g IV.  Further antibiotics/pharmacy consults should be ordered by admitting physician if indicated.                       Thank you, Lynann Beaver PharmD, BCPS Clinical Pharmacist [facility] main pharmacy [phone] 08/11/2021 10:55 AM

## 2021-08-11 NOTE — H&P (Addendum)
History and Physical    DOA: 08/11/2021  PCP: System, Provider Not In  Patient coming from: SNF   Chief Complaint: Left facial swelling  HPI: Benjamin Barnett is a 75 y.o. male with history h/o hypertension, hyperlipidemia, gout, GERD, right upper extremity DVT, prolonged hospitalization at [facility] in [place] (5/31 to 05/29/2021) for MRSA pneumonia/metabolic encephalopathy with PEA arrest, had right-sided sialadenitis during this hospitalization->subsequently transferred to [facility] and stayed there until 07/23/2021->discharged to [facility] and on the same day transferred back to [facility] for right-sided sialoadenitis, associated sepsis/AKI, now presents with recurrent symptoms of facial swelling.  According to the daughter, patient underwent  PEG tube placement while at [facility] for malnutrition--he apparently has been pretty much nonambulatory and bedbound since discharge from [facility].  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 He has been noncommunicative and bedbound at [facility] SN[facility]-brought in today by daughter in concern for recurrent facial/neck swelling (now on left side) associated with worsening lethargy at the facility.  Patient is nonverbal and unable to obtain much of history.  Per ED physician, patient was febrile up to 102F on arrival, tachycardic with heart rate 119-125, tachypneic with respiratory rate 24-30s and hypotensive with SBP 140s to 180s.  Patient also with upper airway secretions, left facial/neck swelling.  Labs showed WBC 12.4, hemoglobin 13.5, potassium 3.3, sodium 140, BUN 28, creatinine 1.19, high-sensitivity troponin 19-21, glucose 153, INR 1.1, lactate 1.4,  imaging studies (CT) confirmed left sided sialadenitis with possible myositis.  Patient received IV Decadron and broad-spectrum antibiotics.  Daughter confirmed DNR status.  Per EDP, ENT was consulted-Dr. Jenne Pane who agreed with conservative management and no role of surgical intervention.  Hospitalist service requested to admit patient for ongoing management.   Review of Systems: As per HPI, otherwise review of systems negative.    Past Medical History:  Diagnosis Date   Cardiac arrest (HCC) 07/24/2021   DVT of axillary vein, acute right (HCC) 05/2021   Essential hypertension 07/24/2021   GERD without esophagitis 07/24/2021   Gout 07/24/2021   History of MRSA infection of lungs 07/24/2021   Mixed hyperlipidemia 07/24/2021   Retroperitoneal hematoma 2022    Past Surgical History:  Procedure Laterality Date   IR GASTROSTOMY TUBE MOD SED  06/16/2021    Social history:  reports that he has never smoked. He has never used smokeless tobacco. He reports that he does not drink alcohol and does not use drugs.   Allergies  Allergen Reactions   Darvon [Propoxyphene]    Oxycodone    Codeine Rash   Furosemide Rash   Ibuprofen Rash   Meperidine Rash   Tape Other (See Comments)    Other reaction(s): Redness    Family History  Family history unknown: Yes      Prior to Admission medications   Medication Sig Start Date End Date Taking? Authorizing Provider  amantadine (SYMMETREL) 50 MG/5ML solution Place 10 mLs (100 mg total) into feeding tube 2 (two) times daily. 08/02/21  Yes Albertine Grates, MD  Amino Acids-Protein Hydrolys (FEEDING SUPPLEMENT, PRO-STAT SUGAR FREE 64,) LIQD Place 30 mLs into feeding tube in the morning and at bedtime.   Yes [provider]  amLODipine (NORVASC) 10 MG tablet Place 1 tablet (10 mg total) into feeding tube daily. 08/03/21  Yes Albertine GratesXu, Fang, MD  chlorhexidine (PERIDEX) 0.12 % solution 15 mLs by Mouth Rinse route 2 (two) times daily. Patient taking  differently: Use as directed 15 mLs in the mouth or throat 2 (two) times daily. 08/02/21  Yes Albertine GratesXu, Fang, MD  insulin aspart (NOVOLOG) 100 UNIT/ML injection Inject 0-9 Units into the skin every 4 (four) hours. Before each meal 3 times a day, 140-199 - 2 units, 200-250 - 4 units, 251-299 - 6 units,  300-349 - 8 units,  350 or above 10 units. Insulin PEN if approved, provide syringes and needles if needed. Patient taking differently: Inject 0-10 Units into the skin 3 (three) times daily before meals. Per sliding scale: 0-139 = 0; 140-199 = 2; 200-250 = 4; 251-299 = 6; 300-349 = 8; 212-120-2704 = 10 08/02/21  Yes Albertine GratesXu, Fang, MD  ipratropium-albuterol (DUONEB) 0.5-2.5 (3) MG/3ML SOLN Take 3 mLs by nebulization 3 (three) times daily. 08/02/21  Yes Albertine GratesXu, Fang, MD  metoCLOPramide (REGLAN) 10 MG tablet Place 1 tablet (10 mg total) into feeding tube 4 (four) times daily -  before meals and at bedtime. Patient taking differently: Place 10 mg into feeding tube 3 (three) times daily before meals. 08/02/21  Yes Albertine GratesXu, Fang, MD  metoprolol tartrate (LOPRESSOR) 25 MG tablet Place 1 tablet (25 mg total) into feeding tube 2 (two) times daily. 08/02/21  Yes Albertine GratesXu, Fang, MD  mirtazapine (REMERON) 7.5 MG tablet Place 1 tablet (7.5 mg total) into feeding tube at bedtime. 08/02/21  Yes Albertine GratesXu, Fang, MD  Nutritional Supplements (FEEDING SUPPLEMENT, NEPRO CARB STEADY,) LIQD Place 1,000 mLs into feeding tube continuous. Patient taking differently: Place 50 mLs into feeding tube continuous. 1550ml/hour for 22 continuous hours. May hold for 1-2 hours a day. Provides 1100ml per 24 hours 08/02/21  Yes Albertine GratesXu, Fang, MD  pantoprazole sodium (PROTONIX) 40 mg PACK Place 20 mLs (40 mg total) into feeding tube daily. 08/03/21  Yes Albertine GratesXu, Fang, MD  QUEtiapine (SEROQUEL) 50 MG tablet Place 1 tablet (50 mg total) into feeding tube at bedtime. 08/02/21  Yes Albertine GratesXu, Fang, MD  Water For Irrigation, Sterile (FREE WATER) SOLN Place 200 mLs into feeding tube every 8 (eight)  hours. Patient taking differently: Place 150 mLs into feeding tube every 8 (eight) hours. 08/02/21  Yes Albertine GratesXu, Fang, MD  Zinc Oxide (TRIPLE PASTE) 12.8 % ointment Apply topically 2 (two) times daily. Patient not taking: No sig reported 08/02/21   Albertine GratesXu, Fang, MD    Physical Exam: Vitals:   08/11/21 1600 08/11/21 1645 08/11/21 1700 08/11/21 1701  BP: (!) 162/102 (!) 151/126 (!) 152/105 130/88  Pulse: (!) 124 (!) 125 (!) 121 (!) 126  Resp: (!) 27 (!) 22 19 (!) 26  Temp:      TempSrc:      SpO2: 97% 100% 96% 97%    Constitutional: Nonverbal, noncommunicative appears to be in mild distress due to upper airway secretions.   Eyes: Eyes open but does not track, PERRL, lids and conjunctivae normal ENMT: Left-sided mandibular swelling extended to upper part of the neck. Respiratory: Upper airway secretions that clear on suctioning, no lower airway wheezing on auscultation bilaterally,  no crackles.  Somewhat tachypneic that improves with suctioning.  Cardiovascular: Tachycardic, no murmurs / rubs / gallops. No extremity edema. 2+ pedal pulses. No carotid bruits.  Abdomen: S/p PEG tube, no tenderness, no masses palpated. No hepatosplenomegaly. Bowel sounds positive.  Musculoskeletal: no clubbing / cyanosis. No joint deformity upper and lower extremities. Good ROM, no contractures. Normal  muscle tone.  Neurologic: CN 2-12 grossly intact. Sensation intact, DTR normal. Strength 5/5 in all 4.  Psychiatric: Normal judgment and insight. Alert and oriented x 3. Normal mood.  SKIN/catheters: Significantly excoriated bottom/perineum with small ulceration/skin breakdown areas Labs on Admission: I have personally reviewed following labs and imaging studies  CBC: Recent Labs  Lab 08/11/21 1055  WBC 12.4*  NEUTROABS 9.9*  HGB 13.5  HCT 41.9  MCV 87.5  PLT 223   Basic Metabolic Panel: Recent Labs  Lab 08/11/21 1055  NA 140  K 3.3*  CL 107  CO2 24  GLUCOSE 153*  BUN 28*  CREATININE 1.19  CALCIUM  10.3   GFR: CrCl cannot be calculated (Unknown ideal weight.). Recent Labs  Lab 08/11/21 1055 08/11/21 1243  WBC 12.4*  --   LATICACIDVEN 1.4 1.1   Liver Function Tests: Recent Labs  Lab 08/11/21 1055  AST 18  ALT 13  ALKPHOS 58  BILITOT 0.6  PROT 7.9  ALBUMIN 3.2*   No results for input(s): LIPASE, AMYLASE in the last 168 hours. No results for input(s): AMMONIA in the last 168 hours. Coagulation Profile: Recent Labs  Lab 08/11/21 1055  INR 1.1   Cardiac Enzymes: No results for input(s): CKTOTAL, CKMB, CKMBINDEX, TROPONINI in the last 168 hours. BNP (last 3 results) No results for input(s): PROBNP in the last 8760 hours. HbA1C: No results for input(s): HGBA1C in the last 72 hours. CBG: Recent Labs  Lab 08/11/21 1032  GLUCAP 134*   Lipid Profile: No results for input(s): CHOL, HDL, LDLCALC, TRIG, CHOLHDL, LDLDIRECT in the last 72 hours. Thyroid Function Tests: No results for input(s): TSH, T4TOTAL, FREET4, T3FREE, THYROIDAB in the last 72 hours. Anemia Panel: No results for input(s): VITAMINB12, FOLATE, FERRITIN, TIBC, IRON, RETICCTPCT in the last 72 hours. Urine analysis:    Component Value Date/Time   COLORURINE YELLOW 08/11/2021 1124   APPEARANCEUR CLOUDY (A) 08/11/2021 1124   LABSPEC 1.017 08/11/2021 1124   PHURINE 5.0 08/11/2021 1124   GLUCOSEU NEGATIVE 08/11/2021 1124   HGBUR NEGATIVE 08/11/2021 1124   BILIRUBINUR NEGATIVE 08/11/2021 1124   KETONESUR NEGATIVE 08/11/2021 1124   PROTEINUR 30 (A) 08/11/2021 1124   NITRITE NEGATIVE 08/11/2021 1124   LEUKOCYTESUR TRACE (A) 08/11/2021 1124    Radiological Exams on Admission: Personally reviewed  CT Soft Tissue Neck W Contrast  Result Date: 08/11/2021 CLINICAL DATA:  Left neck swelling and erythema, suspected abscess EXAM: CT NECK WITH CONTRAST TECHNIQUE: Multidetector CT imaging of the neck was performed using the standard protocol following the bolus administration of intravenous contrast. CONTRAST:   79mL OMNIPAQUE IOHEXOL 350 MG/ML SOLN COMPARISON:  CT neck 07/24/2021 FINDINGS: Pharynx and larynx: There is mucosal edema involving the left oropharynx, hypopharynx, extending to the supraglottic larynx. There is asymmetric effacement of the left vallecula and piriform sinus, similar to the appearance on the contralateral side on the prior study. There is mild inflammatory change in the left parapharyngeal space. The airway remains patent. No abscess is identified. Salivary glands: There is enlargement of the left submandibular gland, significantly increased compared to the prior study. The left parotid gland is not enlarged but has mild surrounding fat stranding. The right parotid and submandibular glands have normalized since the prior study. No salivary gland stones are identified. Thyroid: Unremarkable. Lymph nodes: There is no pathologic lymphadenopathy in the neck. Vascular: There is bulky calcified atherosclerotic plaque at the bilateral carotid bulbs. Limited intracranial: The imaged portions of the intracranial compartment are unremarkable. Visualized orbits:  The imaged globes and orbits are unremarkable. Mastoids and visualized paranasal sinuses: There is mild mucosal thickening in the left sphenoid sinus. The imaged paranasal sinuses and mastoid air cells are otherwise clear. Skeleton: There is degenerative change of the cervical spine most advanced at C6-C7. There is no acute osseous abnormality or aggressive osseous lesion. Upper chest: The lung apices are clear. Other: There is marked inflammatory fat stranding throughout the left face and neck with associated skin thickening. There is asymmetric thickening of the left platysma muscle and likely inflammation of the masseter muscle. The left sternocleidomastoid also appears mildly asymmetrically enlarged. No abscess is identified. IMPRESSION: 1. Extensive inflammation throughout the left face and neck detailed above involving the oropharynx,  hypopharynx, and supraglottic larynx without evidence of airway compromise. No abscess is identified. 2. Left submandibular and likely parotid sialoadenitis with significant overlying inflammatory change suggesting associated cellulitis. No salivary gland stones identified. 3. Asymmetric enlargement of the musculature of the left face and neck as described above suggests infectious/inflammatory myositis. 4. Overall, the appearance described above is similar to the prior study from 07/24/2021, though on the contralateral side. The right-side of the face/neck has normalized in the interim. Electronically Signed   By: Lesia Hausen M.D.   On: 08/11/2021 13:02   DG Chest Port 1 View  Result Date: 08/11/2021 CLINICAL DATA:  Fever EXAM: PORTABLE CHEST 1 VIEW COMPARISON:  Chest radiograph 08/01/2021 FINDINGS: The cardiomediastinal silhouette is stable. There is unchanged calcified atherosclerotic plaque at the aortic arch. There is unchanged elevation of the left hemidiaphragm. Linear opacities in the left base likely reflects subsegmental atelectasis. Otherwise, there is no focal consolidation or pulmonary edema. There is no significant pleural effusion. There is no pneumothorax. There is no acute osseous abnormality. IMPRESSION: No radiographic evidence of acute cardiopulmonary process. Electronically Signed   By: Lesia Hausen M.D.   On: 08/11/2021 11:33    EKG:  Not done in the ED, ordered now     Assessment and Plan:   Principal Problem:   Sepsis (HCC) Active Problems:   Sialadenitis   Acute metabolic encephalopathy   Protein calorie malnutrition (HCC)   GERD without esophagitis   Hypokalemia   Essential hypertension   Mixed hyperlipidemia   Cardiac arrest (HCC)    1.  Recurrent sialadenitis with sepsis syndrome: Patient with leukocytosis, fever, tachycardia on presentation.  Will admit with IV fluids (no echo in chart-Will obtain given recent PEA arrest), IV antibiotics and Decadron.  Per  EDP, ENT-Dr. Jenne Pane recommended conservative management-not sure if they are going to follow her while here, re-consult in a.m. if no improvement.  Will frequently suction to avoid respiratory distress due to retained secretions.  Also added scheduled/as needed neb treatments.  2.  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  But he appeared more lethargic today.  Seen by palliative care team previously and patient DNR/DNI.  Has PEG tube in place, resume feeding and meds via PEG tube.  3.  Hypokalemia: Replace IV/via PEG  4.  Elevated troponin: Likely stress related troponinemia in the setting of problem #1.  Will obtain echo  5. Sacral decubiti: Patient with excoriated bottom and scattered skin breakdown areas/ulcerations.  Will consult wound care-- prev recommendation -MASD/IAD to the intergluteal cleft and groin area around the scrotum Clean the sacral area with no rinse cleanser, pat dry and apply triple paste to the areas of irritation twice  daily or PRN soiling.  6.  GERD, hypertension, hyperlipidemia: Resume prior meds.  Hydralazine as needed for uncontrolled blood pressure.  7. Goals of care: Patient currently DNR/DNI, will reconsult palliative care team for ongoing discussions, to consider hospice discussions and transition if continues to deteriorate.  I did discuss extensively with daughter, Joycie Peek, regarding overall poor prognosis.  She remains hopeful of recovery at least to some extent with nonaggressive interventions.  DVT prophylaxis: Lovenox  COVID screen: Negative  Code Status: DNR/DNI.Health care proxy would be daughter  Patient/Family Communication: Discussed with daughter and all questions answered to satisfaction.  Consults called: ENT-Dr. Jenne Pane consulted by EDP Admission status :I certify that at the point of admission it is my  clinical judgment that the patient will require inpatient hospital care spanning beyond 2 midnights from the point of admission due to high intensity of service and high frequency of surveillance required.Inpatient status is judged to be reasonable and necessary in order to provide the required intensity of service to ensure the patient's safety. The patient's presenting symptoms, physical exam findings, and initial radiographic and laboratory data in the context of their chronic comorbidities is felt to place them at high risk for further clinical deterioration. The following factors support the patient status of inpatient : Recurrent sialadenitis with sepsis-mews score 7-requiring IV therapy and goals of care discussions.     Alessandra Bevels MD [facility] Pager in [place]  If 7PM-7AM, please contact night-coverage [email or web address]   08/11/2021, 5:53 PM

## 2021-08-11 NOTE — ED Triage Notes (Signed)
Pt BIB [facility] from [facility] for Fever and Respiratory Distress. Pt temp with facility 102.0 and RR in the 40's. Pt is on 2L at baseline and remained on 2L with EMS. Per EMS the swelling and redness to the left jaw. Per EMS at baseline patient "mumbles." Pt has a feeding tube in place.

## 2021-08-11 NOTE — Progress Notes (Signed)
Elink following Code Sepsis. 

## 2021-08-11 NOTE — ED Provider Notes (Addendum)
[facility]-EMERGENCY DEPT Provider Note   CSN: [id number] Arrival date & time: 08/11/21  1026     History Chief Complaint  Patient presents with   Fever    Benjamin Barnett is a 75 y.o. male.  75 year old male with prior medical history as detailed below presents for evaluation.  Patient was transported from [place] [facility].  Patient was noted to have fever this morning.  Temperature on arrival to 102.  Patient with apparent new swelling and redness to the left side of his jaw and left neck.  Per report this occurred overnight.  Patient is essentially nonverbal at baseline.  Level 5 caveat secondary to same.  Patient is DNR.  Feeding tube is in place.  Patient has history of recurrent sialoadenitis.  The history is provided by the patient, medical records and the EMS personnel.  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   Mixed hyperlipidemia 07/24/2021   Retroperitoneal hematoma 2022    Patient Active Problem List   Diagnosis Date Noted   Pressure injury of skin 07/27/2021   Acute suppurative parotitis 07/24/2021   Acute metabolic encephalopathy 07/24/2021   Protein calorie malnutrition (HCC) 07/24/2021   GERD without esophagitis 07/24/2021   Lactic acidosis 07/24/2021   Hypokalemia 07/24/2021   Essential hypertension 07/24/2021   Mixed hyperlipidemia 07/24/2021   Gout 07/24/2021   Cardiac arrest (HCC) 07/24/2021   Sepsis (HCC) 07/23/2021    Past Surgical History:  Procedure Laterality Date   IR GASTROSTOMY TUBE MOD SED   06/16/2021       Family History  Family history unknown: Yes    Social History   Tobacco Use   Smoking status: Never   Smokeless tobacco: Never  Substance Use Topics   Alcohol use: Never   Drug use: Never    Home Medications Prior to Admission medications   Medication Sig Start Date End Date Taking? Authorizing Provider  amantadine (SYMMETREL) 50 MG/5ML solution Place 10 mLs (100 mg total) into feeding tube 2 (two) times daily. 08/02/21  Yes Albertine Grates, MD  Amino Acids-Protein Hydrolys (FEEDING SUPPLEMENT, PRO-STAT SUGAR FREE 64,) LIQD Place 30 mLs into feeding tube in the morning and at bedtime.   Yes [provider]  amLODipine (NORVASC) 10 MG tablet Place 1 tablet (10 mg total) into feeding tube daily. 08/03/21  Yes Albertine Grates, MD  chlorhexidine (PERIDEX) 0.12 % solution 15 mLs by Mouth Rinse route 2 (two) times daily. Patient taking differently: Use as directed 15 mLs in the mouth or throat 2 (two) times daily. 08/02/21  Yes Albertine Grates, MD  insulin aspart (NOVOLOG) 100 UNIT/ML injection Inject 0-9 Units into the skin every 4 (four) hours. Before each meal 3 times a day, 140-199 - 2 units, 200-250 - 4 units, 251-299 - 6 units,  300-349 - 8 units,  350 or above 10 units. Insulin PEN if approved, provide syringes and needles if needed. Patient taking differently: Inject 0-10 Units into the skin 3 (three) times daily before meals. Per sliding scale: 0-139 = 0; 140-199 =  2; 200-250 = 4; 251-299 = 6; 300-349 = 8; [phone] = 10 08/02/21  Yes Albertine Grates, MD  ipratropium-albuterol (DUONEB) 0.5-2.5 (3) MG/3ML SOLN Take 3 mLs by nebulization 3 (three) times daily. 08/02/21  Yes Albertine Grates, MD  metoCLOPramide (REGLAN) 10 MG tablet Place 1 tablet (10 mg total) into feeding tube 4 (four) times daily -  before meals and at bedtime. Patient taking differently: Place 10 mg into feeding tube 3 (three) times daily before meals. 08/02/21  Yes Albertine Grates, MD  metoprolol tartrate (LOPRESSOR) 25 MG tablet Place 1  tablet (25 mg total) into feeding tube 2 (two) times daily. 08/02/21  Yes Albertine Grates, MD  mirtazapine (REMERON) 7.5 MG tablet Place 1 tablet (7.5 mg total) into feeding tube at bedtime. 08/02/21  Yes Albertine Grates, MD  Nutritional Supplements (FEEDING SUPPLEMENT, NEPRO CARB STEADY,) LIQD Place 1,000 mLs into feeding tube continuous. Patient taking differently: Place 50 mLs into feeding tube continuous. 65ml/hour for 22 continuous hours. May hold for 1-2 hours a day. Provides per 24 hours 08/02/21  Yes Albertine Grates, MD  pantoprazole sodium (PROTONIX) 40 mg PACK Place 20 mLs (40 mg total) into feeding tube daily. 08/03/21  Yes Albertine Grates, MD  QUEtiapine (SEROQUEL) 50 MG tablet Place 1 tablet (50 mg total) into feeding tube at bedtime. 08/02/21  Yes Albertine Grates, MD  Water For Irrigation, Sterile (FREE WATER) SOLN Place 200 mLs into feeding tube every 8 (eight) hours. Patient taking differently: Place 150 mLs into feeding tube every 8 (eight) hours. 08/02/21  Yes Albertine Grates, MD  Zinc Oxide (TRIPLE PASTE) 12.8 % ointment Apply topically 2 (two) times daily. Patient not taking: No sig reported 08/02/21   Albertine Grates, MD    Allergies    Darvon [propoxyphene], Oxycodone, Codeine, Furosemide, Ibuprofen, Meperidine, and Tape  Review of Systems   Review of Systems  Unable to perform ROS: Acuity of condition  Constitutional:  Positive for fever.   Physical Exam Updated Vital Signs BP (!) 180/97   Pulse (!) 119   Temp (!) 102.3 F (39.1 C) (Rectal)   Resp (!) 26   SpO2 97%   Physical Exam Vitals and nursing note reviewed.  Constitutional:      General: He is not in acute distress.    Appearance: He is well-developed.  HENT:     Head: Normocephalic and atraumatic.     Comments: Area of erythema and swelling noted to the left lateral face and left neck.  See images below. Eyes:     Conjunctiva/sclera: Conjunctivae normal.     Pupils: Pupils are equal, round, and reactive to light.  Cardiovascular:     Rate and  Rhythm: Normal rate and regular rhythm.     Heart sounds: Normal heart sounds.  Pulmonary:     Effort: Pulmonary effort is normal. No respiratory distress.     Breath sounds: Normal breath sounds.  Abdominal:     General: There is no distension.     Palpations: Abdomen is soft.     Tenderness: There is no abdominal tenderness.  Musculoskeletal:        General: No deformity. Normal range of motion.     Cervical back: Normal range of motion and neck supple.  Skin:    General: Skin is warm and dry.  Neurological:     General: No focal deficit present.     Mental Status: He is alert. Mental status is at baseline.     Comments: Responds to  verbal stimulation.  Patient is essentially nonverbal.  This appears to be baseline.     ED Results / Procedures / Treatments   Labs (all labs ordered are listed, but only abnormal results are displayed) Labs Reviewed  COMPREHENSIVE METABOLIC PANEL - Abnormal; Notable for the following components:      Result Value   Potassium 3.3 (*)    Glucose, Bld 153 (*)    BUN 28 (*)    Albumin 3.2 (*)    All other components within normal limits  CBC WITH DIFFERENTIAL/PLATELET - Abnormal; Notable for the following components:   WBC 12.4 (*)    RDW 15.8 (*)    Neutro Abs 9.9 (*)    Monocytes Absolute 1.1 (*)    Abs Immature Granulocytes 0.08 (*)    All other components within normal limits  URINALYSIS, ROUTINE W REFLEX MICROSCOPIC - Abnormal; Notable for the following components:   APPearance CLOUDY (*)    Protein, ur 30 (*)    Leukocytes,Ua TRACE (*)    Bacteria, UA RARE (*)    All other components within normal limits  CBG MONITORING, ED - Abnormal; Notable for the following components:   Glucose-Capillary 134 (*)    All other components within normal limits  TROPONIN I (HIGH SENSITIVITY) - Abnormal; Notable for the following components:   Troponin I (High Sensitivity) 19 (*)    All other components within normal limits  RESP PANEL BY RT-PCR  (FLU A&B, COVID) ARPGX2  CULTURE, BLOOD (ROUTINE X 2)  CULTURE, BLOOD (ROUTINE X 2)  LACTIC ACID, PLASMA  PROTIME-INR  LACTIC ACID, PLASMA  TROPONIN I (HIGH SENSITIVITY)    EKG None  Radiology CT Soft Tissue Neck W Contrast  Result Date: 08/11/2021 CLINICAL DATA:  Left neck swelling and erythema, suspected abscess EXAM: CT NECK WITH CONTRAST TECHNIQUE: Multidetector CT imaging of the neck was performed using the standard protocol following the bolus administration of intravenous contrast. CONTRAST:  60mL OMNIPAQUE IOHEXOL 350 MG/ML SOLN COMPARISON:  CT neck 07/24/2021 FINDINGS: Pharynx and larynx: There is mucosal edema involving the left oropharynx, hypopharynx, extending to the supraglottic larynx. There is asymmetric effacement of the left vallecula and piriform sinus, similar to the appearance on the contralateral side on the prior study. There is mild inflammatory change in the left parapharyngeal space. The airway remains patent. No abscess is identified. Salivary glands: There is enlargement of the left submandibular gland, significantly increased compared to the prior study. The left parotid gland is not enlarged but has mild surrounding fat stranding. The right parotid and submandibular glands have normalized since the prior study. No salivary gland stones are identified. Thyroid: Unremarkable. Lymph nodes: There is no pathologic lymphadenopathy in the neck. Vascular: There is bulky calcified atherosclerotic plaque at the bilateral carotid bulbs. Limited intracranial: The imaged portions of the intracranial compartment are unremarkable. Visualized orbits: The imaged globes and orbits are unremarkable. Mastoids and visualized paranasal sinuses: There is mild mucosal thickening in the left sphenoid sinus. The imaged paranasal sinuses and mastoid air cells are otherwise clear. Skeleton: There is degenerative change of the cervical spine most advanced at C6-C7. There is no acute osseous  abnormality or aggressive osseous lesion. Upper chest: The lung apices are clear. Other: There is marked inflammatory fat stranding throughout the left face and neck with associated skin thickening. There is asymmetric thickening of the left platysma muscle and likely inflammation of the masseter muscle. The left sternocleidomastoid also appears mildly asymmetrically enlarged. No abscess is identified. IMPRESSION:  1. Extensive inflammation throughout the left face and neck detailed above involving the oropharynx, hypopharynx, and supraglottic larynx without evidence of airway compromise. No abscess is identified. 2. Left submandibular and likely parotid sialoadenitis with significant overlying inflammatory change suggesting associated cellulitis. No salivary gland stones identified. 3. Asymmetric enlargement of the musculature of the left face and neck as described above suggests infectious/inflammatory myositis. 4. Overall, the appearance described above is similar to the prior study from 07/24/2021, though on the contralateral side. The right-side of the face/neck has normalized in the interim. Electronically Signed   By: Lesia HausenPeter  Noone M.D.   On: 08/11/2021 13:02   DG Chest Port 1 View  Result Date: 08/11/2021 CLINICAL DATA:  Fever EXAM: PORTABLE CHEST 1 VIEW COMPARISON:  Chest radiograph 08/01/2021 FINDINGS: The cardiomediastinal silhouette is stable. There is unchanged calcified atherosclerotic plaque at the aortic arch. There is unchanged elevation of the left hemidiaphragm. Linear opacities in the left base likely reflects subsegmental atelectasis. Otherwise, there is no focal consolidation or pulmonary edema. There is no significant pleural effusion. There is no pneumothorax. There is no acute osseous abnormality. IMPRESSION: No radiographic evidence of acute cardiopulmonary process. Electronically Signed   By: Lesia HausenPeter  Noone M.D.   On: 08/11/2021 11:33    Procedures Procedures   Medications Ordered in  ED Medications  vancomycin (VANCOREADY) IVPB 1750 mg/350 mL (1,750 mg Intravenous New Bag/Given 08/11/21 1200)  sodium chloride 0.9 % bolus 1,000 mL (0 mLs Intravenous Stopped 08/11/21 1155)  dexamethasone (DECADRON) injection 10 mg (10 mg Intravenous Given 08/11/21 1117)  acetaminophen (TYLENOL) suppository 650 mg (650 mg Rectal Given 08/11/21 1118)  ceFEPIme (MAXIPIME) 2 g in sodium chloride 0.9 % 100 mL IVPB (0 g Intravenous Stopped 08/11/21 1143)  iohexol (OMNIPAQUE) 350 MG/ML injection 75 mL (75 mLs Intravenous Contrast Given 08/11/21 1222)    ED Course  I have reviewed the triage vital signs and the nursing notes.  Pertinent labs & imaging results that were available during my care of the patient were reviewed by me and considered in my medical decision making (see chart for details).    MDM Rules/Calculators/A&P                           CRITICAL CARE Performed by: Wynetta FinesPeter C Shrey Boike   Total critical care time: 30 minutes  Critical care time was exclusive of separately billable procedures and treating other patients.  Critical care was necessary to treat or prevent imminent or life-threatening deterioration.  Critical care was time spent personally by me on the following activities: development of treatment plan with patient and/or surrogate as well as nursing, discussions with consultants, evaluation of patient's response to treatment, examination of patient, obtaining history from patient or surrogate, ordering and performing treatments and interventions, ordering and review of laboratory studies, ordering and review of radiographic studies, pulse oximetry and re-evaluation of patient's condition.   MDM  MSE complete  Benjamin Barnett was evaluated in Emergency Department on 08/11/2021 for the symptoms described in the history of present illness. He was evaluated in the context of the global COVID-19 pandemic, which necessitated consideration that the patient might be at risk for  infection with the SARS-CoV-2 virus that causes COVID-19. Institutional protocols and algorithms that pertain to the evaluation of patients at risk for COVID-19 are in a state of rapid change based on information released by regulatory bodies including the CDC and federal and state organizations. These policies and algorithms  were followed during the patient's care in the ED.  Patient is presenting for evaluation of reported fever and swelling to the left face and left neck.  Presentation is concerning for infection with possible sialoadenitis.  Patient is DNR/DNI.  This was confirmed thru a conversation on the phone with the patient's daughter Marchelle Folks.  Broad-spectrum antibiotics initiated.  Decadron administered.  CT imaging suggest left-sided sialoadenitis with possible myositis.  No abscess identified.  Patient would benefit from IV antibiotics and admission for continued treatment.  Hospitalist service is aware of case and will evaluate.  Dr. Jenne Pane (ENT) is aware of case. Agrees with plan of care. Will consult officially if requested by inpatient service.  Final Clinical Impression(s) / ED Diagnoses Final diagnoses:  Fever, unspecified fever cause  Sialoadenitis    Rx / DC Orders ED Discharge Orders     None        Wynetta Fines, MD 08/11/21 1411    Wynetta Fines, MD 08/11/21 [phone]

## 2021-08-12 ENCOUNTER — Inpatient Hospital Stay (HOSPITAL_COMMUNITY): Payer: Medicare Other

## 2021-08-12 DIAGNOSIS — I517 Cardiomegaly: Secondary | ICD-10-CM

## 2021-08-12 LAB — CBC
HCT: 40.6 % (ref 39.0–52.0)
Hemoglobin: 12.8 g/dL — ABNORMAL LOW (ref 13.0–17.0)
MCH: 27.8 pg (ref 26.0–34.0)
MCHC: 31.5 g/dL (ref 30.0–36.0)
MCV: 88.3 fL (ref 80.0–100.0)
Platelets: 185 10*3/uL (ref 150–400)
RBC: 4.6 MIL/uL (ref 4.22–5.81)
RDW: 15.7 % — ABNORMAL HIGH (ref 11.5–15.5)
WBC: 11.3 10*3/uL — ABNORMAL HIGH (ref 4.0–10.5)
nRBC: 0 % (ref 0.0–0.2)

## 2021-08-12 LAB — ECHOCARDIOGRAM COMPLETE
AR max vel: 2.56 cm2
AV Area VTI: 2.79 cm2
AV Area mean vel: 2.67 cm2
AV Mean grad: 7 mmHg
AV Peak grad: 16.6 mmHg
Ao pk vel: 2.04 m/s
Area-P 1/2: 4.49 cm2
S' Lateral: 2.9 cm
Single Plane A4C EF: 68.2 %
Weight: 3205 oz

## 2021-08-12 LAB — GLUCOSE, CAPILLARY
Glucose-Capillary: 185 mg/dL — ABNORMAL HIGH (ref 70–99)
Glucose-Capillary: 159 mg/dL — ABNORMAL HIGH (ref 70–99)
Glucose-Capillary: 176 mg/dL — ABNORMAL HIGH (ref 70–99)
Glucose-Capillary: 200 mg/dL — ABNORMAL HIGH (ref 70–99)

## 2021-08-12 LAB — COMPREHENSIVE METABOLIC PANEL
ALT: 14 U/L (ref 0–44)
AST: 16 U/L (ref 15–41)
Albumin: 3.1 g/dL — ABNORMAL LOW (ref 3.5–5.0)
Alkaline Phosphatase: 48 U/L (ref 38–126)
Anion gap: 8 (ref 5–15)
BUN: 32 mg/dL — ABNORMAL HIGH (ref 8–23)
CO2: 25 mmol/L (ref 22–32)
Calcium: 10.4 mg/dL — ABNORMAL HIGH (ref 8.9–10.3)
Chloride: 112 mmol/L — ABNORMAL HIGH (ref 98–111)
Creatinine, Ser: 1.26 mg/dL — ABNORMAL HIGH (ref 0.61–1.24)
GFR, Estimated: >60 mL/min (ref >60)
Glucose, Bld: 185 mg/dL — ABNORMAL HIGH (ref 70–99)
Potassium: 4.1 mmol/L (ref 3.5–5.1)
Sodium: 145 mmol/L (ref 135–145)
Total Bilirubin: 0.7 mg/dL (ref 0.3–1.2)
Total Protein: 7.9 g/dL (ref 6.5–8.1)

## 2021-08-12 MED ORDER — PERFLUTREN LIPID MICROSPHERE
1.0000 mL | INTRAVENOUS | Status: AC | PRN
  Administered 2021-08-12: 2 mL via INTRAVENOUS
  Filled 2021-08-12: qty 10

## 2021-08-12 MED ORDER — SODIUM CHLORIDE 0.9 % IV SOLN: INTRAVENOUS | Status: DC

## 2021-08-12 NOTE — Consult Note (Signed)
WOC Nurse Consult Note: Patient receiving care in [id number]. Off-going and on-coming RNs present at time of my assessment. 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 Pressure Injury POA: Yes Measurement: Wound bed: Primarily red with satellite lesions, and again, purple evolving DTPIs along each buttock within the areas impacted by the MASD. The abdominal pannus is reddened with satellite lesions. Drainage (amount, consistency, odor) none Periwound: intact Dressing procedure/placement/frequency: Every shift and prn incontinence cleaning, apply Triple paste to the buttocks, then sprinkle Antifungal powder (green and white bottle in clean utility) over the paste.  Also apply antifungal powder to the groin and pannus areas.  I have also ordered for bilateral Prevalon heel lift boots while patient is in bed.  These have been ordered by the [place].  Monitor the wound area(s) for worsening of condition such as: Signs/symptoms of infection,  Increase in size,  Development of or worsening of odor, Development of pain, or increased pain at the affected locations.  Notify the medical team if any of these develop.  Thank you for the consult.  Discussed plan of care with the bedside nurse.  WOC nurse will not follow at this time.  Please re-consult the WOC team if needed.  Helmut Muster, RN, MSN, CWOCN, CNS-BC, pager [phone]

## 2021-08-12 NOTE — Evaluation (Signed)
Clinical/Bedside Swallow Evaluation Patient Details  Name: Benjamin Barnett MRN: [id number] Date of Birth: 1946-09-27  Today's Date: 08/12/2021 Time: SLP Start Time (ACUTE ONLY): 1916 SLP Stop Time (ACUTE ONLY): 1940 SLP Time Calculation (min) (ACUTE ONLY): 24 min  Past Medical History:  Past Medical History:  Diagnosis Date   Cardiac arrest (HCC) 07/24/2021   DVT of axillary vein, acute right (HCC) 05/2021   Essential hypertension 07/24/2021   GERD without esophagitis 07/24/2021   Gout 07/24/2021   History of MRSA infection of lungs 07/24/2021   Mixed hyperlipidemia 07/24/2021   Retroperitoneal hematoma 2022   Past Surgical History:  Past Surgical History:  Procedure Laterality Date   IR GASTROSTOMY TUBE MOD SED  06/16/2021   HPI:  Per ED MD notes - Pt is a 75 yo male with h/o "prolonged hospitalization at [facility] in [place] (5/31 to 05/29/2021) for MRSA pneumonia/metabolic encephalopathy with PEA arrest, had right-sided sialadenitis during this hospitalization->[facility] and stayed there until 07/23/2021->discharged to [facility] and on the same day transferred back to [facility] for right-sided sialoadenitis, associated sepsis/AKI, now presents with recurrent symptoms of facial swelling.  According to the daughter, patient underwent  PEG tube placement while at [facility] for malnutrition--he apparently has been pretty much nonambulatory and bedbound since discharge from [facility].  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  He has been noncommunicative and bedbound at [facility]-brought in today by daughter in concern for recurrent facial/neck swelling (now on  left side) associated with worsening lethargy at the facility.  Patient is nonverbal and unable to obtain much of history"  Swallow evaluation ordered by hospitalist.  Pt has h/o ETT 8/12-8/15 during prior hospital coarse. During prior hospital admit, hew as diagnosed with severe parotitis and facial cellulitis.    New Neck CT 08/11/21 showed Extensive inflammation throughout the left face and neck detailed  above involving the oropharynx, hypopharynx, and supraglottic larynx  without evidence of airway compromise. No abscess is identified.  2. Left submandibular and likely parotid sialoadenitis with  significant overlying inflammatory change suggesting associated  cellulitis. No salivary gland stones identified.  3. Asymmetric enlargement of the musculature of the left face and  neck as described above suggests infectious/inflammatory myositis.  4. Overall, the appearance described above is similar to the prior  study from 07/24/2021, though on the contralateral side. The  right-side of the face/neck has normalized in the interim.  Pt underwent FEES study at 8/17 with poor tolerance due to gagging and vomiting = flash penetration of thin and good tolerance of puree - premature spillage of boluses into pharynx.   Assessment / Plan / Recommendation Clinical Impression  Pt noted today to have significant edema on left side of neck - know sialoadentitis = that may impact pharyngeal motility.  He did not follow directions but did respond appropriately via "yes", "no" and "sometimes" to questions during the session.  Pt allowed minimal oral care - frequently seals lips tightly closed.  Pt accepting intake including single ice chips, tsps thin water, tsps nectar cranberry juice and single small bolus of applesauce.  Decreased attention to po trials noted as pt held boluses in oral cavity. He did demonstrate swallow with all boluses although suspected to be clinically delayed.  No immediate  indication of aspiration,  however after approximately the 5th bolus, pt presented with strong coughing episode that appeared uncomfortable for him - SLP encouraged he cough and was able to clear viscous yellow tinged secretions from oral cavity with suctioning.  Did not provide pt with more po at this time due to concerns for pharyngeal retention of secretions and/or boluses causing overt aspiration.  Pt is resistant to oral care and even some intake - which prevents him from receiving adequate oral care.  At this time, his edema appears to be largest issue with his swallowing ability. As pt's sialoadentitis, cellulitis, infectious/inflammatory myositis resolves with treatment, anticipate he would be candidate for po intake in small amounts for enjoyment/comfort.  Will follow up for dysphagia education with pt's family and highly recommend SLP follow up at next venue of care to aid in determining when po and/or instrumental swallow evaluation may be indicated. SLP Visit Diagnosis: Dysphagia, unspecified (R13.10)    Aspiration Risk  Severe aspiration risk;Risk for inadequate nutrition/hydration    Diet Recommendation NPO   Medication Administration: Via alternative means    Other  Recommendations Oral Care Recommendations: Oral care QID;Staff/trained caregiver to provide oral care   Follow up Recommendations Skilled Nursing facility      Frequency and Duration min 1 x/week  1 week       Prognosis Prognosis for Safe Diet Advancement: Guarded Barriers to Reach Goals: Cognitive deficits;Severity of deficits      Swallow Study   General HPI: Per ED MD notes - Pt is a 75 yo male with h/o "prolonged hospitalization at [facility] in [place] (5/31 to 05/29/2021) for MRSA pneumonia/metabolic encephalopathy with PEA arrest, had right-sided sialadenitis during this hospitalization->[facility] and stayed there until 07/23/2021->discharged to [facility] and on  the same day transferred back to [facility] for right-sided sialoadenitis, associated sepsis/AKI, now presents with recurrent symptoms of facial swelling.  According to the daughter, patient underwent  PEG tube placement while at [facility] for malnutrition--he apparently has been pretty much nonambulatory and bedbound since discharge from [facility].  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  He has been noncommunicative and bedbound at [facility]-brought in today by daughter in concern for recurrent facial/neck swelling (now on left side) associated with worsening lethargy at the facility.  Patient is nonverbal and unable to obtain much of history"  Swallow evaluation ordered by hospitalist.  Pt has h/o ETT 8/12-8/15 during prior hospital coarse. During prior hospital admit, hew as diagnosed with severe parotitis and facial cellulitis.    New Neck CT 08/11/21 showed Extensive inflammation throughout the left face and neck detailed  above involving the oropharynx, hypopharynx, and supraglottic larynx  without evidence of airway compromise. No abscess is identified.  2. Left submandibular and likely parotid sialoadenitis with  significant overlying inflammatory change suggesting associated  cellulitis. No salivary gland stones identified.  3. Asymmetric enlargement of the musculature of the left face and  neck as described above suggests infectious/inflammatory myositis.  4. Overall, the appearance described above is similar to the prior  study from 07/24/2021, though on the contralateral side. The  right-side of the face/neck has normalized in the interim.  Pt underwent FEES study at 8/17 with poor tolerance due to gagging and vomiting = flash  penetration of  thin and good tolerance of puree - premature spillage of boluses into pharynx. Type of Study: Bedside Swallow Evaluation Previous Swallow Assessment: FEES study see HPI Diet Prior to this Study: NPO;Other (Comment);PEG tube Temperature Spikes Noted: No Respiratory Status: Room air History of Recent Intubation: No Behavior/Cognition: Alert;Confused;Doesn't follow directions Oral Cavity Assessment: Dry Oral Care Completed by SLP: No (oral care provided with total encouragement) Oral Cavity - Dentition: Adequate natural dentition Self-Feeding Abilities: Total assist Patient Positioning: Upright in bed;Postural control adequate for testing Baseline Vocal Quality: Low vocal intensity Volitional Cough: Cognitively unable to elicit Volitional Swallow: Unable to elicit    Oral/Motor/Sensory Function Overall Oral Motor/Sensory Function: Other (comment) (pt able to seal lips tightly closed and articulate "no" and "yes" clearly)   Ice Chips Ice chips: Impaired Presentation: Spoon Oral Phase Impairments: Poor awareness of bolus;Reduced labial seal Pharyngeal Phase Impairments: Suspected delayed Swallow;Decreased hyoid-laryngeal movement   Thin Liquid Thin Liquid: Impaired (single bolus) Presentation: Spoon Oral Phase Impairments: Poor awareness of bolus Oral Phase Functional Implications: Oral holding Pharyngeal  Phase Impairments: Suspected delayed Swallow;Decreased hyoid-laryngeal movement    Nectar Thick Nectar Thick Liquid: Impaired Presentation: Spoon Oral Phase Impairments: Reduced labial seal;Reduced lingual movement/coordination;Poor awareness of bolus Oral phase functional implications: Oral holding Pharyngeal Phase Impairments: Decreased hyoid-laryngeal movement;Cough - Delayed;Suspected delayed Swallow   Honey Thick Honey Thick Liquid: Not tested   Puree Puree: Impaired Presentation: Spoon Oral Phase Impairments: Poor awareness of bolus Oral Phase Functional Implications: Oral  holding;Prolonged oral transit Pharyngeal Phase Impairments: Decreased hyoid-laryngeal movement;Cough - Delayed   Solid     Solid: Not tested      Chales Abrahams 08/12/2021,8:38 PM  Rolena Infante, MS [facility] SLP Acute Rehab Services Office [phone] Pager [phone]

## 2021-08-12 NOTE — Progress Notes (Addendum)
Progress Note    Benjamin Barnett  RUE:[id number] DOB: 09-27-1946  DOA: 08/11/2021 PCP: System, Provider Not In    Brief Narrative:     Medical records reviewed and are as summarized below:  Benjamin Barnett is an 75 y.o. male with history h/o hypertension, hyperlipidemia, gout, GERD, right upper extremity DVT, prolonged hospitalization at [facility] in [place] (5/31 to 05/29/2021) for MRSA pneumonia/metabolic encephalopathy with PEA arrest, had right-sided sialadenitis during this hospitalization->subsequently transferred to [facility] and stayed there until 07/23/2021->discharged to [facility] and on the same day transferred back to [facility] for right-sided sialoadenitis, associated sepsis/AKI.  Discharged 8/21 now presents with recurrent symptoms of facial swelling.  According to the daughter, patient underwent  PEG tube placement while at [facility] for malnutrition--he apparently has been pretty much nonambulatory and bedbound since discharge from [facility].    Assessment/Plan:   Principal Problem:   Sepsis (HCC) Active Problems:   Acute metabolic encephalopathy   Protein calorie malnutrition (HCC)   GERD without esophagitis   Hypokalemia   Essential hypertension   Mixed hyperlipidemia   Cardiac arrest (HCC)   Sialadenitis    Recurrent sialadenitis with sepsis syndrome:  -Patient with leukocytosis, fever, tachycardia on presentation.   - IV fluids  -IV antibiotics and Decadron.   -Per EDP: ENT-Dr. Jenne Pane recommended conservative management- re-consult if needed -Will frequently suction to avoid respiratory distress due to retained secretions.  Also added scheduled/as needed neb treatments.   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   -Hydralazine as needed for uncontrolled blood pressure.   Goals of care: Patient currently DNR/DNI, re-consult palliative care team for ongoing discussions, to consider hospice discussions and transition if continues to deteriorate.        Family Communication/Anticipated D/C date and plan/Code Status   DVT prophylaxis: Lovenox ordered. Code Status: DNR Family Communication: no family at bedside, called daughter Disposition Plan: Status is: Inpatient  Remains inpatient appropriate because:Inpatient level of care appropriate due to severity of illness  Dispo: The patient is from: SNF              Anticipated d/c is to:  tbd              Patient currently is not medically stable to d/c.- suspect more appropriate for hospice   Difficult to place patient No         Medical Consultants:   ENT (phone) Palliative care    Subjective:   Getting echo, eyes open, staring at ceiling and moaning  Objective:    Vitals:   08/12/21 0111 08/12/21 0300 08/12/21 0545 08/12/21 0756  BP: (!) 143/85 (!) 157/93 (!) 167/83   Pulse: (!) 104 96 97   Resp: (!) 22 (!) 22 20   Temp: 98.4 F (36.9 C) 98.6 F (37 C)  98.4 F (36.9 C)   TempSrc: Axillary Axillary Axillary   SpO2: 94% 96% 96% 94%  Weight:        Intake/Output Summary (Last 24 hours) at 08/12/2021 4081 Last data filed at 08/12/2021 0630 Gross per 24 hour  Intake 835 ml  Output 400 ml  Net 435 ml   Filed Weights   08/11/21 1856  Weight: 90.9 kg     Exam:  General: Appearance:     Overweight male in mild distress     Lungs:     respirations unlabored, diminished  Heart:    Normal heart rate.    MS:   All extremities are intact.    Neurologic:   Opens eyes to voice, blinks to throat, not following commands     Data Reviewed:   I have personally reviewed following labs and imaging studies:  Labs: Labs show the following:   Basic Metabolic Panel: Recent Labs  Lab 08/11/21 1055 08/11/21 1807 08/12/21 0415  NA 140  --  145  K 3.3*  --  4.1  CL 107  --  112*  CO2 24  --  25  GLUCOSE 153*  --  185*  BUN 28*  --  32*  CREATININE 1.19 1.28* 1.26*  CALCIUM 10.3  --  10.4*   GFR Estimated Creatinine Clearance: 60.1 mL/min (A) (by C-G formula based on SCr of 1.26 mg/dL (H)). Liver Function Tests: Recent Labs  Lab 08/11/21 1055 08/12/21 0415  AST 18 16  ALT 13 14  ALKPHOS 58 48  BILITOT 0.6 0.7  PROT 7.9 7.9  ALBUMIN 3.2* 3.1*   No results for input(s): LIPASE, AMYLASE in the last 168 hours. No results for input(s): AMMONIA in the last 168 hours. Coagulation profile Recent Labs  Lab 08/11/21 1055  INR 1.1    CBC: Recent Labs  Lab 08/11/21 1055 08/11/21 1807 08/12/21 0415  WBC 12.4* 13.4* 11.3*  NEUTROABS 9.9*  --   --   HGB 13.5 13.4 12.8*  HCT 41.9 42.4 40.6  MCV 87.5 89.5 88.3  PLT 223 204 185   Cardiac Enzymes: No results for input(s): CKTOTAL, CKMB, CKMBINDEX, TROPONINI in the last 168 hours. BNP (last 3 results) No results for input(s): PROBNP in the last 8760 hours. CBG: Recent Labs  Lab 08/11/21 1032 08/12/21 0837  GLUCAP 134* 185*   D-Dimer: No results for input(s): DDIMER in the last 72 hours. Hgb A1c: No results for input(s): HGBA1C in the last 72 hours. Lipid Profile: No results for input(s): CHOL, HDL, LDLCALC, TRIG, CHOLHDL, LDLDIRECT in the last 72 hours. Thyroid function studies: No results for input(s): TSH, T4TOTAL, T3FREE, THYROIDAB in the last 72  hours.  Invalid input(s): FREET3 Anemia work up: No results for input(s): VITAMINB12, FOLATE, FERRITIN, TIBC, IRON, RETICCTPCT in the last 72 hours. Sepsis Labs: Recent Labs  Lab 08/11/21 1055 08/11/21 1243 08/11/21 1807 08/12/21 0415  WBC 12.4*  --  13.4* 11.3*  LATICACIDVEN 1.4 1.1  --   --     Microbiology Recent Results (from the past 240 hour(s))  Resp Panel by RT-PCR (Flu A&B, Covid) Nasopharyngeal Swab     Status: None   Collection Time: 08/02/21  2:46 PM   Specimen: Nasopharyngeal Swab; Nasopharyngeal(NP) swabs in vial transport medium  Result Value Ref Range Status   SARS Coronavirus 2 by RT PCR NEGATIVE NEGATIVE Final    Comment: (NOTE) SARS-CoV-2 target nucleic acids are NOT DETECTED.  The SARS-CoV-2 RNA is generally detectable in upper  respiratory specimens during the acute phase of infection. The lowest concentration of SARS-CoV-2 viral copies this assay can detect is 138 copies/mL. A negative result does not preclude SARS-Cov-2 infection and should not be used as the sole basis for treatment or other patient management decisions. A negative result may occur with  improper specimen collection/handling, submission of specimen other than nasopharyngeal swab, presence of viral mutation(s) within the areas targeted by this assay, and inadequate number of viral copies(<138 copies/mL). A negative result must be combined with clinical observations, patient history, and epidemiological information. The expected result is Negative.  Fact Sheet for Patients:  BloggerCourse.com  Fact Sheet for Healthcare Providers:  SeriousBroker.it  This test is no t yet approved or cleared by the [place] FDA and  has been authorized for detection and/or diagnosis of SARS-CoV-2 by FDA under an Emergency Use Authorization (EUA). This EUA will remain  in effect (meaning this test can be used) for the duration of the COVID-19  declaration under Section 564(b)(1) of the Act, 21 U.S.C.section 360bbb-3(b)(1), unless the authorization is terminated  or revoked sooner.       Influenza A by PCR NEGATIVE NEGATIVE Final   Influenza B by PCR NEGATIVE NEGATIVE Final    Comment: (NOTE) The Xpert Xpress SARS-CoV-2/FLU/RSV plus assay is intended as an aid in the diagnosis of influenza from Nasopharyngeal swab specimens and should not be used as a sole basis for treatment. Nasal washings and aspirates are unacceptable for Xpert Xpress SARS-CoV-2/FLU/RSV testing.  Fact Sheet for Patients: BloggerCourse.com  Fact Sheet for Healthcare Providers: SeriousBroker.it  This test is not yet approved or cleared by the [place] FDA and has been authorized for detection and/or diagnosis of SARS-CoV-2 by FDA under an Emergency Use Authorization (EUA). This EUA will remain in effect (meaning this test can be used) for the duration of the COVID-19 declaration under Section 564(b)(1) of the Act, 21 U.S.C. section 360bbb-3(b)(1), unless the authorization is terminated or revoked.  Performed at [facility], [address] [place], [place] [address]   Resp Panel by RT-PCR (Flu A&B, Covid) Nasopharyngeal Swab     Status: None   Collection Time: 08/11/21 10:55 AM   Specimen: Nasopharyngeal Swab; Nasopharyngeal(NP) swabs in vial transport medium  Result Value Ref Range Status   SARS Coronavirus 2 by RT PCR NEGATIVE NEGATIVE Final    Comment: (NOTE) SARS-CoV-2 target nucleic acids are NOT DETECTED.  The SARS-CoV-2 RNA is generally detectable in upper respiratory specimens during the acute phase of infection. The lowest concentration of SARS-CoV-2 viral copies this assay can detect is 138 copies/mL. A negative result does not preclude SARS-Cov-2 infection and should not be used as the sole basis for treatment or other patient management decisions. A negative result may  occur with  improper specimen collection/handling, submission of specimen other than nasopharyngeal swab, presence of viral mutation(s) within the areas targeted by this assay, and inadequate number of viral copies(<138 copies/mL). A negative result must be combined with clinical observations, patient history, and epidemiological information. The expected result is Negative.  Fact Sheet for Patients:  BloggerCourse.com  Fact Sheet for Healthcare Providers:  SeriousBroker.it  This test is no t yet approved or cleared by the [place] FDA and  has been authorized for detection and/or diagnosis of SARS-CoV-2 by FDA under an Emergency Use Authorization (EUA). This EUA will remain  in effect (meaning this test can be used) for the duration of the COVID-19 declaration under Section 564(b)(1) of the  Act, 21 U.S.C.section 360bbb-3(b)(1), unless the authorization is terminated  or revoked sooner.       Influenza A by PCR NEGATIVE NEGATIVE Final   Influenza B by PCR NEGATIVE NEGATIVE Final    Comment: (NOTE) The Xpert Xpress SARS-CoV-2/FLU/RSV plus assay is intended as an aid in the diagnosis of influenza from Nasopharyngeal swab specimens and should not be used as a sole basis for treatment. Nasal washings and aspirates are unacceptable for Xpert Xpress SARS-CoV-2/FLU/RSV testing.  Fact Sheet for Patients: BloggerCourse.comhttps://www.fda.gov/media/152166/download  Fact Sheet for Healthcare Providers: SeriousBroker.ithttps://www.fda.gov/media/152162/download  This test is not yet approved or cleared by the [place] FDA and has been authorized for detection and/or diagnosis of SARS-CoV-2 by FDA under an Emergency Use Authorization (EUA). This EUA will remain in effect (meaning this test can be used) for the duration of the COVID-19 declaration under Section 564(b)(1) of the Act, 21 U.S.C. section 360bbb-3(b)(1), unless the authorization is terminated  or revoked.  Performed at [facility], [address] [place], [place] [address]   MRSA Next Gen by PCR, Nasal     Status: Abnormal   Collection Time: 08/11/21  7:42 PM   Specimen: Nasal Mucosa; Nasal Swab  Result Value Ref Range Status   MRSA by PCR Next Gen DETECTED (A) NOT DETECTED Final    Comment: RESULT CALLED TO, READ BACK BY AND VERIFIED WITH: RN JILL AT 2140 08/11/21 CRUICKSHANK A (NOTE) The GeneXpert MRSA Assay (FDA approved for NASAL specimens only), is one component of a comprehensive MRSA colonization surveillance program. It is not intended to diagnose MRSA infection nor to guide or monitor treatment for MRSA infections. Test performance is not FDA approved in patients less than 73 years old. Performed at [facility], [address] [place], [place] [address]     Procedures and diagnostic studies:  CT Soft Tissue Neck W Contrast  Result Date: 08/11/2021 CLINICAL DATA:  Left neck swelling and erythema, suspected abscess EXAM: CT NECK WITH CONTRAST TECHNIQUE: Multidetector CT imaging of the neck was performed using the standard protocol following the bolus administration of intravenous contrast. CONTRAST:  75mL OMNIPAQUE IOHEXOL 350 MG/ML SOLN COMPARISON:  CT neck 07/24/2021 FINDINGS: Pharynx and larynx: There is mucosal edema involving the left oropharynx, hypopharynx, extending to the supraglottic larynx. There is asymmetric effacement of the left vallecula and piriform sinus, similar to the appearance on the contralateral side on the prior study. There is mild inflammatory change in the left parapharyngeal space. The airway remains patent. No abscess is identified. Salivary glands: There is enlargement of the left submandibular gland, significantly increased compared to the prior study. The left parotid gland is not enlarged but has mild surrounding fat stranding. The right parotid and submandibular glands have normalized since the  prior study. No salivary gland stones are identified. Thyroid: Unremarkable. Lymph nodes: There is no pathologic lymphadenopathy in the neck. Vascular: There is bulky calcified atherosclerotic plaque at the bilateral carotid bulbs. Limited intracranial: The imaged portions of the intracranial compartment are unremarkable. Visualized orbits: The imaged globes and orbits are unremarkable. Mastoids and visualized paranasal sinuses: There is mild mucosal thickening in the left sphenoid sinus. The imaged paranasal sinuses and mastoid air cells are otherwise clear. Skeleton: There is degenerative change of the cervical spine most advanced at C6-C7. There is no acute osseous abnormality or aggressive osseous lesion. Upper chest: The lung apices are clear. Other: There is marked inflammatory fat stranding throughout the left face and neck with associated skin  thickening. There is asymmetric thickening of the left platysma muscle and likely inflammation of the masseter muscle. The left sternocleidomastoid also appears mildly asymmetrically enlarged. No abscess is identified. IMPRESSION: 1. Extensive inflammation throughout the left face and neck detailed above involving the oropharynx, hypopharynx, and supraglottic larynx without evidence of airway compromise. No abscess is identified. 2. Left submandibular and likely parotid sialoadenitis with significant overlying inflammatory change suggesting associated cellulitis. No salivary gland stones identified. 3. Asymmetric enlargement of the musculature of the left face and neck as described above suggests infectious/inflammatory myositis. 4. Overall, the appearance described above is similar to the prior study from 07/24/2021, though on the contralateral side. The right-side of the face/neck has normalized in the interim. Electronically Signed   By: Lesia Hausen M.D.   On: 08/11/2021 13:02   DG Chest Port 1 View  Result Date: 08/11/2021 CLINICAL DATA:  Fever EXAM: PORTABLE  CHEST 1 VIEW COMPARISON:  Chest radiograph 08/01/2021 FINDINGS: The cardiomediastinal silhouette is stable. There is unchanged calcified atherosclerotic plaque at the aortic arch. There is unchanged elevation of the left hemidiaphragm. Linear opacities in the left base likely reflects subsegmental atelectasis. Otherwise, there is no focal consolidation or pulmonary edema. There is no significant pleural effusion. There is no pneumothorax. There is no acute osseous abnormality. IMPRESSION: No radiographic evidence of acute cardiopulmonary process. Electronically Signed   By: Lesia Hausen M.D.   On: 08/11/2021 11:33    Medications:    amantadine  100 mg Per Tube BID   amLODipine  10 mg Per Tube Daily   Chlorhexidine Gluconate Cloth  6 each Topical Q0600   dexamethasone (DECADRON) injection  4 mg Intravenous Q12H   enoxaparin (LOVENOX) injection  40 mg Subcutaneous Q24H   feeding supplement (PROSource TF)  45 mL Per Tube BID   free water  200 mL Per Tube Q8H   ipratropium-albuterol  3 mL Nebulization TID   mouth rinse  15 mL Mouth Rinse BID   metoCLOPramide  10 mg Per Tube TID AC & HS   metoprolol tartrate  25 mg Per Tube BID   mirtazapine  7.5 mg Per Tube QHS   mupirocin ointment  1 application Nasal BID   pantoprazole sodium  40 mg Per Tube Daily   QUEtiapine  50 mg Per Tube QHS   Zinc Oxide   Topical BID   Continuous Infusions:  ampicillin-sulbactam (UNASYN) IV 1.5 g (08/12/21 0351)   feeding supplement (NEPRO CARB STEADY) 50 mL/hr at 08/11/21 2200     LOS: 1 day   Joseph Art  [facility]   How to contact the [facility] Attending or Consulting provider 7A - 7P or covering provider during after hours 7P -7A, for this patient?  Check the care team in [facility] and look for a) attending/consulting TRH provider listed and b) the [facility] team listed Log into [email or web address] and use [place]'s universal password to access. If you do not have the password, please contact the hospital  operator. Locate the [facility] provider you are looking for under [facility] and page to a number that you can be directly reached. If you still have difficulty reaching the provider, please page the [facility] (Director on Call) for the Hospitalists listed on amion for assistance.  08/12/2021, 9:52 AM

## 2021-08-13 DIAGNOSIS — R509 Fever, unspecified: Secondary | ICD-10-CM

## 2021-08-13 DIAGNOSIS — E46 Unspecified protein-calorie malnutrition: Secondary | ICD-10-CM

## 2021-08-13 DIAGNOSIS — R52 Pain, unspecified: Secondary | ICD-10-CM

## 2021-08-13 DIAGNOSIS — Z515 Encounter for palliative care: Secondary | ICD-10-CM

## 2021-08-13 DIAGNOSIS — Z7189 Other specified counseling: Secondary | ICD-10-CM

## 2021-08-13 LAB — COMPREHENSIVE METABOLIC PANEL
ALT: 20 U/L (ref 0–44)
AST: 22 U/L (ref 15–41)
Albumin: 3 g/dL — ABNORMAL LOW (ref 3.5–5.0)
Alkaline Phosphatase: 49 U/L (ref 38–126)
Anion gap: 8 (ref 5–15)
BUN: 40 mg/dL — ABNORMAL HIGH (ref 8–23)
CO2: 24 mmol/L (ref 22–32)
Calcium: 9.9 mg/dL (ref 8.9–10.3)
Chloride: 113 mmol/L — ABNORMAL HIGH (ref 98–111)
Creatinine, Ser: 1.04 mg/dL (ref 0.61–1.24)
GFR, Estimated: >60 mL/min (ref >60)
Glucose, Bld: 196 mg/dL — ABNORMAL HIGH (ref 70–99)
Potassium: 3.6 mmol/L (ref 3.5–5.1)
Sodium: 145 mmol/L (ref 135–145)
Total Bilirubin: 0.5 mg/dL (ref 0.3–1.2)
Total Protein: 7.6 g/dL (ref 6.5–8.1)

## 2021-08-13 LAB — CBC WITH DIFFERENTIAL/PLATELET
Abs Immature Granulocytes: 0.09 10*3/uL — ABNORMAL HIGH (ref 0.00–0.07)
Basophils Absolute: 0 10*3/uL (ref 0.0–0.1)
Basophils Relative: 0 %
Eosinophils Absolute: 0 10*3/uL (ref 0.0–0.5)
Eosinophils Relative: 0 %
HCT: 35.2 % — ABNORMAL LOW (ref 39.0–52.0)
Hemoglobin: 11.5 g/dL — ABNORMAL LOW (ref 13.0–17.0)
Immature Granulocytes: 1 %
Lymphocytes Relative: 6 %
Lymphs Abs: 0.6 10*3/uL — ABNORMAL LOW (ref 0.7–4.0)
MCH: 28.4 pg (ref 26.0–34.0)
MCHC: 32.7 g/dL (ref 30.0–36.0)
MCV: 86.9 fL (ref 80.0–100.0)
Monocytes Absolute: 0.4 10*3/uL (ref 0.1–1.0)
Monocytes Relative: 4 %
Neutro Abs: 9.8 10*3/uL — ABNORMAL HIGH (ref 1.7–7.7)
Neutrophils Relative %: 89 %
Platelets: 179 10*3/uL (ref 150–400)
RBC: 4.05 MIL/uL — ABNORMAL LOW (ref 4.22–5.81)
RDW: 15.7 % — ABNORMAL HIGH (ref 11.5–15.5)
WBC: 10.9 10*3/uL — ABNORMAL HIGH (ref 4.0–10.5)
nRBC: 0 % (ref 0.0–0.2)

## 2021-08-13 LAB — GLUCOSE, CAPILLARY
Glucose-Capillary: 179 mg/dL — ABNORMAL HIGH (ref 70–99)
Glucose-Capillary: 176 mg/dL — ABNORMAL HIGH (ref 70–99)
Glucose-Capillary: 169 mg/dL — ABNORMAL HIGH (ref 70–99)
Glucose-Capillary: 181 mg/dL — ABNORMAL HIGH (ref 70–99)
Glucose-Capillary: 147 mg/dL — ABNORMAL HIGH (ref 70–99)

## 2021-08-13 MED ORDER — LORAZEPAM 2 MG/ML IJ SOLN: 1.0000 mg | INTRAMUSCULAR | Status: DC | PRN

## 2021-08-13 MED ORDER — LORAZEPAM 1 MG PO TABS: 1.0000 mg | ORAL_TABLET | ORAL | Status: DC | PRN

## 2021-08-13 MED ORDER — MORPHINE SULFATE (PF) 2 MG/ML IV SOLN: 1.0000 mg | INTRAVENOUS | Status: DC | PRN

## 2021-08-13 MED ORDER — GLYCOPYRROLATE 0.2 MG/ML IJ SOLN
0.2000 mg | INTRAMUSCULAR | Status: DC
  Administered 2021-08-13 – 2021-08-14 (×6): 0.2 mg via INTRAVENOUS
  Filled 2021-08-13 (×6): qty 1

## 2021-08-13 MED ORDER — HALOPERIDOL LACTATE 2 MG/ML PO CONC: 0.5000 mg | ORAL | Status: DC | PRN

## 2021-08-13 MED ORDER — HALOPERIDOL 0.5 MG PO TABS: 0.5000 mg | ORAL_TABLET | ORAL | Status: DC | PRN

## 2021-08-13 MED ORDER — LORAZEPAM 2 MG/ML PO CONC: 1.0000 mg | ORAL | Status: DC | PRN

## 2021-08-13 MED ORDER — HALOPERIDOL LACTATE 5 MG/ML IJ SOLN: 0.5000 mg | INTRAMUSCULAR | Status: DC | PRN

## 2021-08-13 NOTE — Consult Note (Signed)
Palliative Care Consult Note                                  Date: 08/13/2021   Patient Name: Benjamin Barnett  DOB: August 03, 1946  MRN: [id number]  Age / Sex: 75 y.o., male  PCP: System, Provider Not In Referring Physician: Antonieta Pert, MD  Reason for Consultation: Establishing goals of care  HPI/Patient Profile: 75 y.o. male  with past medical history of hypertension, hyperlipidemia, gout, GERD, right upper extremity DVT, prolonged hospitalization at [facility] in [place] (5/31 to 05/29/2021) for MRSA pneumonia/metabolic encephalopathy with PEA arrest, had right-sided sialadenitis during this hospitalization->subsequently transferred to [facility] and stayed there until 07/23/2021->discharged to [facility] and on the same day transferred back to [facility] for right-sided sialoadenitis, associated sepsis/AKI and discharged 8/21. He is now admitted on 08/11/2021 with recurrent symptoms, facial swelling, recurrent sialadenitis with sepsis, and AKI.  Past Medical History:  Diagnosis Date   Cardiac arrest ([place]) 07/24/2021   DVT of axillary vein, acute right ([place]) 05/2021   Essential hypertension 07/24/2021   GERD without esophagitis 07/24/2021   Gout 07/24/2021   History of MRSA infection of lungs 07/24/2021   Mixed hyperlipidemia 07/24/2021   Retroperitoneal hematoma 2022    Subjective:   This NP Walden Field reviewed medical records, received report from team, assessed the patient and then meet at the patient's bedside to discuss diagnosis, prognosis, GOC, EOL wishes disposition and options.  I met with the patient at the bedside. He is mostly non-verbal, although appears to be making attempts to speak, but it is intelligible. I offered him encouragement and support.  I called the patient's daughter Ledell Noss. Concept of Palliative Care was introduced as specialized medical care for people  and their families living with serious illness.  If focuses on providing relief from the symptoms and stress of a serious illness.  The goal is to improve quality of life for both the patient and the family. Values and goals of care important to patient and family were attempted to be elicited.  Created space and opportunity for patient  and family to explore thoughts and feelings regarding current medical situation   Natural trajectory and current clinical status were discussed. Questions and concerns addressed. Patient  encouraged to call with questions or concerns.    Patient/Family Understanding of Illness: The patient's daughter understands that he has another infection in his saliva gland in his mouth that they are treating with antibiotics.  There is some concern about fluids and needing to frequently suction.  She states that yesterday the nurse told her he was just staring blankly and moaning.  This seems to concern her.  We discussed his current clinical situation including trajectory of care and options for care moving forward.  Patient Values: Family, faith, independence  Today's Discussion: The conversation I had with Estill Bamberg was extensive and in-depth.  We reviewed the patient's medical history that started approximately February this year.  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  She shares that her mom had recently passed in September 2021.  Since then he had recurrent health challenges including a DVT requiring surgery, prolonged wound healing, MRSA pneumonia, PEA arrest requiring LTAC care.  She shares that at that point she agreed to a feeding tube for her father because she felt it would be  a short-term thing to allow his mental status to return and him to regain a good quality of life.  However, since then he has had repeated admissions and health insults and she feels that he is recovering less each time.  She now feels that  her return to a "acceptable quality of life" is not possible.  She now questions the decision to put in the feeding tube.  She reiterates that everything has been done in order to support his mentation returning.  In general, based on her discussions with him previously and documented sign/living well, he would not want a feeding tube and would not want to "live like this."  She feels that this is not a quality of life that he would find acceptable.  In his living will he verbalized that he would want measures if he had a chance of a decent life (meaningful recovery?)  But otherwise no artificial prolongation.  His daughter shares that she feels after her mom passed he may have "given up".  I explained the difference between aggressive care versus comfort care.  She shares that at this point that she would like to shift his care to a comfort focus and plan to enroll in hospice.  She would like to continue to manage his symptoms such as pain.  She would like to continue antibiotics for now.  I agree that this can help with pain reduction if the infection (sialadenitis with sepsis) is treated.  However, she would like to stop the tube feeds given that he would not agree to a feeding tube in this situation in general.  We also discussed stopping unnecessary medications, lab draws, continuous monitoring.  She verbalized understanding and agreed.  I provided counseling, emotional support for the patient.  I inquired as to her personal support system and she states she has a great support system and I encouraged her to lean on them.  She does offer that her father is a spiritual/religious person.  I offered spiritual care consultation to provide for presence and prayer and she agreed that he would definitely want that.  I answered all questions and encouraged her to call us if there is any questions or concerns moving forward.  Review of Systems  Unable to perform ROS: Patient nonverbal   Objective:    Primary Diagnoses: Present on Admission:  Sepsis ([place])  Acute metabolic encephalopathy  Essential hypertension  GERD without esophagitis  Hypokalemia  Protein calorie malnutrition ([place])  Mixed hyperlipidemia  Cardiac arrest [facility])   Physical Exam Vitals and nursing note reviewed.  Constitutional:      General: He is not in acute distress.    Appearance: He is ill-appearing.     Comments: Does track to my voice; non-verbal. Follows basic command with significant delay  HENT:     Head: Normocephalic and atraumatic.  Cardiovascular:     Rate and Rhythm: Normal rate and regular rhythm.     Heart sounds: Murmur heard.  Systolic murmur is present with a grade of 2/6.  Abdominal:     General: Abdomen is protuberant.     Palpations: Abdomen is soft.     Tenderness: There is no abdominal tenderness.  Skin:    General: Skin is warm and dry.  Neurological:     Mental Status: He is alert.    Vital Signs:  BP (!) 151/84 (BP Location: Right Arm)   Pulse 87   Temp 98.7 F (37.1 C) (Oral)   Resp 20  Wt 90.9 kg   SpO2 97%   BMI 28.74 kg/m   Palliative Assessment/Data: 10%    Advanced Care Planning:   Primary Decision Maker: NEXT OF KIN  Code Status/Advance Care Planning: DNR  A discussion was had today regarding advanced directives. Concepts specific to code status, artifical feeding and hydration, continued IV antibiotics and rehospitalization was had.  The difference between a aggressive medical intervention path and a palliative comfort care path for this patient at this time was had.   Decisions/Changes to ACP: Remain DNR Shift to comfort care Stop artificial tube feedings  Assessment & Plan:   Impression: Patient with recurrent sepsis and sialadenitis with persistent minimal responsiveness.  Recent multiple acute health insults including PEA arrest a couple months ago.  He is likely to continue to have health insults and continue his downward  trajectory/decline toward end of life.  I discussed the situation with his daughter extensively and she has made a decision to shift towards comfort care and focus on quality rather than cure.  She is requested to have him enrolled with hospice and palliative care of [facility].  SUMMARY OF RECOMMENDATIONS   Remain DNR Shift to comfort care Stop unnecessary medications not related to comfort Stop artificial tube feedings Stop lab draws Vital signs once a shift I will add as needed medications for symptom management  Symptom Management:  Robinul 0.2 every 4 hours for secretions Morphine 1 mg every 30 minutes for pain or dyspnea Lorazepam 0.5 mg every 4 hours for anxiety Haldol 0.5 mg every 4 hours agitation  Prognosis:  < 2 weeks  Discharge Planning:  Home with Hospice   Discussed with: Patient's daughter, Dr. Lupita Leash, Welton Flakes (RN), Myraette McGobbony (TOC/CM)    Thank you for allowing [place] to participate in the care of [place] PMT will continue to support holistically.  Time Total: 150 min  Greater than 50%  of this time was spent counseling and coordinating care related to the above assessment and plan.  Signed by: Walden Field, NP Palliative Medicine Team  Team Phone # [phone] (Nights/Weekends)  08/13/2021, 9:36 AM

## 2021-08-13 NOTE — TOC Progression Note (Signed)
Transition of Care [facility]) - Progression Note    Patient Details  Name: Benjamin Barnett MRN: [id number] Date of Birth: [birth year]  Transition of Care [facility]) CM/SW Contact  Geni Bers, RN Phone Number: 08/13/2021, 12:20 PM  Clinical Narrative:     Pt from [facility] and may return, however pt's daughter would like a SNF closer to home.   Expected Discharge Plan: Skilled Nursing Facility Barriers to Discharge: No Barriers Identified  Expected Discharge Plan and Services Expected Discharge Plan: Skilled Nursing Facility       Living arrangements for the past 2 months: Skilled Nursing Facility Expected Discharge Date:  (unknown)                                     Social Determinants of Health (SDOH) Interventions    Readmission Risk Interventions No flowsheet data found.

## 2021-08-13 NOTE — Progress Notes (Signed)
SLP Cancellation Note  Patient Details Name: Benjamin Barnett MRN: [id number] DOB: 1946-03-23   Cancelled treatment:       Reason Eval/Treat Not Completed: Other (comment) (pt now comfort care - and to dc hospice)  Rolena Infante, MS [facility] SLP Acute Rehab Services Office [phone] Pager [phone]   Chales Abrahams 08/13/2021, 5:31 PM

## 2021-08-13 NOTE — NC FL2 (Signed)
[place] MEDICAID FL2 LEVEL OF CARE SCREENING TOOL     IDENTIFICATION  Patient Name: Benjamin Barnett Birthdate: 1946-02-13 Sex: male Admission Date (Current Location): 08/11/2021  [facility] and [place] Number:  Producer, television/film/video and Address:  [facility],  [address], [place] [address]      Provider Number: [id number]  Attending Physician Name and Address:  Lanae Boast, MD  Relative Name and Phone Number:  Joycie Peek Daughter   [phone]    Current Level of Care: Hospital Recommended Level of Care: Skilled Nursing Facility Prior Approval Number:    Date Approved/Denied:   PASRR Number: [id number]  Discharge Plan: SNF    Current Diagnoses: Patient Active Problem List   Diagnosis Date Noted   Sialadenitis 08/11/2021   Pressure injury of skin 07/27/2021   Acute suppurative parotitis 07/24/2021   Acute metabolic encephalopathy 07/24/2021   Protein calorie malnutrition (HCC) 07/24/2021   GERD without esophagitis 07/24/2021   Lactic acidosis 07/24/2021   Hypokalemia 07/24/2021   Essential hypertension 07/24/2021   Mixed hyperlipidemia 07/24/2021   Gout 07/24/2021   Cardiac arrest (HCC) 07/24/2021   Sepsis (HCC) 07/23/2021    Orientation RESPIRATION BLADDER Height & Weight     Self  Normal Incontinent, Indwelling catheter Weight: 90.9 kg Height:     BEHAVIORAL SYMPTOMS/MOOD NEUROLOGICAL BOWEL NUTRITION STATUS      Incontinent Feeding tube (PEG)  AMBULATORY STATUS COMMUNICATION OF NEEDS Skin   Total Care Verbally Other (Comment) (Bilateral Arms Skin Tears, Moisture right buttock, Groin Left redness, Peg Site normal)                       Personal Care Assistance Level of Assistance  Total care Bathing Assistance: Maximum assistance Feeding assistance: Maximum assistance Dressing Assistance: Maximum assistance Total Care Assistance: Maximum assistance   Functional Limitations Info  Sight, Hearing, Speech   Hearing Info:  Adequate Speech Info: Impaired (Pt is [place])    SPECIAL CARE FACTORS FREQUENCY  PT (By licensed PT), OT (By licensed OT)     PT Frequency: Eval and Treat OT Frequency: Eval and Treat            Contractures Contractures Info: Not present    Additional Factors Info  Code Status, Allergies, Psychotropic Code Status Info: DNR Allergies Info: Darvon (Propoxyphene), Oxycodone, Codeine, Furosemide, Ibuprofen, Meperidine, Tape Psychotropic Info: See discharge summary Insulin Sliding Scale Info: See discharge summary       Current Medications (08/13/2021):  This is the current hospital active medication list Current Facility-Administered Medications  Medication Dose Route Frequency Provider Last Rate Last Admin   acetaminophen (TYLENOL) 160 MG/5ML solution 650 mg  650 mg Per Tube Q6H PRN Alessandra Bevels, MD       Or   acetaminophen (TYLENOL) suppository 650 mg  650 mg Rectal Q6H PRN Alessandra Bevels, MD       amantadine (SYMMETREL) 50 MG/5ML solution 100 mg  100 mg Per Tube BID Alessandra Bevels, MD   100 mg at 08/13/21 1100   amLODipine (NORVASC) tablet 10 mg  10 mg Per Tube Daily Kamineni, Neelima, MD   10 mg at 08/13/21 1100   ampicillin-sulbactam (UNASYN) 1.5 g in sodium chloride 0.9 % 100 mL IVPB  1.5 g Intravenous Q6H Kamineni, Neelima, MD 200 mL/hr at 08/13/21 1146 1.5 g at 08/13/21 1146   bisacodyl (DULCOLAX) EC tablet 5 mg  5 mg Oral Daily PRN Alessandra Bevels, MD       Chlorhexidine  Gluconate Cloth 2 % PADS 6 each  6 each Topical Q0600 Luiz Iron, NP   6 each at 08/13/21 0617   dexamethasone (DECADRON) injection 4 mg  4 mg Intravenous Q12H Alessandra Bevels, MD   4 mg at 08/13/21 1100   enoxaparin (LOVENOX) injection 40 mg  40 mg Subcutaneous Q24H Alessandra Bevels, MD   40 mg at 08/12/21 2237   feeding supplement (NEPRO CARB STEADY) liquid 1,000 mL  1,000 mL Per Tube Continuous Alessandra Bevels, MD 50 mL/hr at 08/12/21 2305 1,000 mL at 08/12/21 2305   feeding  supplement (PROSource TF) liquid 45 mL  45 mL Per Tube BID Len Childs T, RPH   45 mL at 08/13/21 1100   free water 200 mL  200 mL Per Tube Q8H Kamineni, Neelima, MD   200 mL at 08/13/21 0618   hydrALAZINE (APRESOLINE) injection 10 mg  10 mg Intravenous Q6H PRN Kamineni, Sallye Ober, MD       ipratropium-albuterol (DUONEB) 0.5-2.5 (3) MG/3ML nebulizer solution 3 mL  3 mL Nebulization TID Alessandra Bevels, MD   3 mL at 08/13/21 0739   ipratropium-albuterol (DUONEB) 0.5-2.5 (3) MG/3ML nebulizer solution 3 mL  3 mL Nebulization Q4H PRN Alessandra Bevels, MD       MEDLINE mouth rinse  15 mL Mouth Rinse BID Lajuana Ripple, Neelima, MD   15 mL at 08/13/21 1100   metoCLOPramide (REGLAN) tablet 10 mg  10 mg Per Tube TID AC & HS Alessandra Bevels, MD   10 mg at 08/13/21 1151   metoprolol tartrate (LOPRESSOR) tablet 25 mg  25 mg Per Tube BID Alessandra Bevels, MD   25 mg at 08/13/21 1151   mirtazapine (REMERON) tablet 7.5 mg  7.5 mg Per Tube QHS Alessandra Bevels, MD   7.5 mg at 08/12/21 2239   mupirocin ointment (BACTROBAN) 2 % 1 application  1 application Nasal BID Luiz Iron, NP   1 application at 08/13/21 1100   pantoprazole sodium (PROTONIX) 40 mg oral suspension 40 mg  40 mg Per Tube Daily Alessandra Bevels, MD   40 mg at 08/12/21 1550   QUEtiapine (SEROQUEL) tablet 50 mg  50 mg Per Tube QHS Alessandra Bevels, MD   50 mg at 08/12/21 2237   Zinc Oxide (TRIPLE PASTE) 12.8 % ointment   Topical BID Alessandra Bevels, MD   Given at 08/13/21 1100     Discharge Medications: Please see discharge summary for a list of discharge medications.  Relevant Imaging Results:  Relevant Lab Results:   Additional Information SS#[id number]  Geni Bers, RN

## 2021-08-13 NOTE — Progress Notes (Signed)
PROGRESS NOTE Benjamin Barnett  QMV:[id number] DOB: [birth year] DOA: 08/11/2021 PCP: System, Provider Not In  Chief Complaint  Patient presents with   Fever   Brief Narrative: 75 year old male with multiple complex comorbidities with hypertension, hyperlipidemia, gout, GERD, right upper extremity DVT, prolonged hospitalization at [facility] in [place] (5/31 to 05/29/2021) for MRSA pneumonia/metabolic encephalopathy with PEA arrest, had right-sided sialadenitis during this hospitalization->subsequently transferred to [facility] and stayed there until 07/23/2021->discharged to [facility] and on the same day transferred back to [facility] for right-sided sialoadenitis, associated sepsis/AKI.  Discharged 8/21  presented with recurrent symptoms of facial swelling As per the daughter patient underwent PEG tube placement while at [facility] for malnutrition and apparently has been pretty much nonambulatory and bedbound since discharge from [facility].  Subjective: Seen and examined this morning patient is nonverbal.  Does not follow any instruction. Nursing has been vacuuming his oral cavity. He is on room air afebrile overnight.  Assessment & Plan:  Recurrent sialadenitis with sepsis: On admission leukocytosis febrile tachycardic.  Managed with IV fluids IV antibiotics Decadron.Per EDP: ENT-Dr. Jenne Pane recommended conservative management- re-consult if needed.  Respiratory status is stable, WBC count improving.  Continue frequent suctioning to avoid respiratory distress due to retained secretion, continue scheduled/as needed nebulizer, continue IV Unasyn, Decadron 4 mg IV every 12 hours. Recent Labs  Lab 08/11/21 1055 08/11/21 1243 08/11/21 1807 08/12/21 0415 08/13/21 0421  WBC 12.4*  --  13.4* 11.3* 10.9*  LATICACIDVEN 1.4 1.1  --   --   --    Status post PEG tube:Tolerating tube feeds.  Mild hypercalcemia-resolved.  Continue free water through  PEG tube.  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  EEG no seizure spikes during last admission.  Continue supportive care  Total care/bedbound/debility/failure to thrive: Nursing providing around the clock care, offloading to prevent Pressure sores  Hypokalemia improved  AKI: Resolved.  Continue PEG tube feeding Recent Labs  Lab 08/11/21 1055 08/11/21 1807 08/12/21 0415 08/13/21 0421  BUN 28*  --  32* 40*  CREATININE 1.19 1.28* 1.26* 1.04    Elevated troponin likely stress related due to sepsis.  Echo ordered in the ED-EF 65 to 70%, no regional WMA.  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  Diet Order             Diet NPO time specified  Diet effective now                  Patient's Body mass index is 28.74 kg/m. DVT prophylaxis: enoxaparin (LOVENOX) injection 40 mg Start: 08/11/21 2200 Code Status:   Code Status: DNR  Family Communication: plan of care discussed with patient at bedside. Status is: Inpatient  Remains inpatient appropriate because:IV treatments appropriate due to intensity of illness or inability to take PO and Inpatient level of care appropriate due to severity of illness  Dispo: The patient is from: SNF  Anticipated d/c is to: SNF              Patient currently is not medically stable to d/c.   Difficult to place patient No  Unresulted Labs (From admission, onward)     Start      Ordered   08/18/21 0500  Creatinine, serum  (enoxaparin (LOVENOX)    CrCl >/= 30 ml/min)  Weekly,   R     Comments: while on enoxaparin therapy    08/11/21 1734           Medications reviewed:  Scheduled Meds:  amantadine  100 mg Per Tube BID   amLODipine  10 mg Per Tube Daily   Chlorhexidine Gluconate Cloth  6 each Topical Q0600   dexamethasone (DECADRON) injection  4 mg Intravenous Q12H   enoxaparin (LOVENOX) injection  40 mg Subcutaneous Q24H   feeding supplement (PROSource TF)  45 mL Per Tube BID   free water  200 mL Per Tube Q8H   ipratropium-albuterol  3 mL Nebulization TID   mouth rinse  15 mL Mouth Rinse BID   metoCLOPramide  10 mg Per Tube TID AC & HS   metoprolol tartrate  25 mg Per Tube BID   mirtazapine  7.5 mg Per Tube QHS   mupirocin ointment  1 application Nasal BID   pantoprazole sodium  40 mg Per Tube Daily   QUEtiapine  50 mg Per Tube QHS   Zinc Oxide   Topical BID   Continuous Infusions:  ampicillin-sulbactam (UNASYN) IV 1.5 g (08/13/21 1146)   feeding supplement (NEPRO CARB STEADY) 1,000 mL (08/12/21 2305)   Consultants:see note  Procedures:see note Antimicrobials: Anti-infectives (From admission, onward)    Start     Dose/Rate Route Frequency Ordered Stop   08/11/21 2200  ampicillin-sulbactam (UNASYN) 1.5 g in sodium chloride 0.9 % 100 mL IVPB        1.5 g 200 mL/hr over 30 Minutes Intravenous Every 6 hours 08/11/21 1734     08/11/21 1115  vancomycin (VANCOREADY) IVPB 1750 mg/350 mL        1,750 mg 175 mL/hr over 120 Minutes Intravenous  Once 08/11/21 1110 08/11/21 1405   08/11/21 1100  ceFEPIme (MAXIPIME) 2 g in sodium chloride 0.9 % 100 mL IVPB        2 g 200 mL/hr over 30 Minutes Intravenous  Once 08/11/21 1056 08/11/21 1143      Culture/Microbiology    Component Value Date/Time   SDES  08/11/2021 1141    RIGHT ANTECUBITAL Performed at [facility], [address] [place], [place] [address]    SPECREQUEST   08/11/2021 1141    BOTTLES DRAWN AEROBIC AND ANAEROBIC Blood Culture adequate volume Performed at [facility], [address], [place], [place] [address]    CULT  08/11/2021 1141    NO GROWTH 2 DAYS Performed at [facility], [address] [place], [place] [address]    REPTSTATUS PENDING 08/11/2021 1141    Other culture-see note  Objective: Vitals: Today's Vitals   08/13/21 0622 08/13/21 0739 08/13/21 1100 08/13/21 1105  BP: (!) 151/84  (!) 151/74 (!) 151/74  Pulse: 87   90  Resp: 20     Temp: 98.7 F (37.1 C)     TempSrc: Oral     SpO2: 98% 97%  97%  Weight:      PainSc:        Intake/Output Summary (Last 24 hours) at 08/13/2021  1154 Last data filed at 08/13/2021 0618 Gross per 24 hour  Intake 2932.9 ml  Output 1326 ml  Net 1606.9 ml   Filed Weights   08/11/21 1856  Weight: 90.9 kg   Weight change:   Intake/Output from previous day: 08/31 0701 - 09/01 0700 In: 2932.9 [I.V.:657.9; NG/GT:1775; IV Piggyback:500] Out: 1326 [Urine:1325; Stool:1] Intake/Output this shift: No intake/output data recorded. Filed Weights   08/11/21 1856  Weight: 90.9 kg   Examination: General exam: Alert awake nonverbal does not follow command, not in distress  HEENT:Oral mucosa moist, Ear/Nose WNL grossly,dentition normal. Respiratory system: bilaterally diminished, no use of accessory muscle, non tender. Cardiovascular system: S1 & S2 +,No JVD. Gastrointestinal system: Abdomen soft, NT,ND, BS+.  PEG tube in place no bleeding or drainage Nervous System:Alert, awake, nonverbal does not follow command Extremities: leg edema, distal peripheral pulses palpable.  Skin: No rashes,no icterus. MSK: Normal muscle bulk,tone, power  Data Reviewed: I have personally reviewed following labs and imaging studies CBC: Recent Labs  Lab 08/11/21 1055 08/11/21 1807 08/12/21 0415 08/13/21 0421  WBC 12.4* 13.4* 11.3* 10.9*  NEUTROABS 9.9*  --   --  9.8*  HGB 13.5 13.4  12.8* 11.5*  HCT 41.9 42.4 40.6 35.2*  MCV 87.5 89.5 88.3 86.9  PLT 223 204 185 179   Basic Metabolic Panel: Recent Labs  Lab 08/11/21 1055 08/11/21 1807 08/12/21 0415 08/13/21 0421  NA 140  --  145 145  K 3.3*  --  4.1 3.6  CL 107  --  112* 113*  CO2 24  --  25 24  GLUCOSE 153*  --  185* 196*  BUN 28*  --  32* 40*  CREATININE 1.19 1.28* 1.26* 1.04  CALCIUM 10.3  --  10.4* 9.9   GFR: Estimated Creatinine Clearance: 72.8 mL/min (by C-G formula based on SCr of 1.04 mg/dL). Liver Function Tests: Recent Labs  Lab 08/11/21 1055 08/12/21 0415 08/13/21 0421  AST 18 16 22   ALT 13 14 20   ALKPHOS 58 48 49  BILITOT 0.6 0.7 0.5  PROT 7.9 7.9 7.6  ALBUMIN 3.2* 3.1* 3.0*   No results for input(s): LIPASE, AMYLASE in the last 168 hours. No results for input(s): AMMONIA in the last 168 hours. Coagulation Profile: Recent Labs  Lab 08/11/21 1055  INR 1.1   Cardiac Enzymes: No results for input(s): CKTOTAL, CKMB, CKMBINDEX, TROPONINI in the last 168 hours. BNP (last 3 results) No results for input(s): PROBNP in the last 8760 hours. HbA1C: No results for input(s): HGBA1C in the last 72 hours. CBG: Recent Labs  Lab 08/12/21 2208 08/13/21 0127 08/13/21 0620 08/13/21 0732 08/13/21 1122  GLUCAP 200* 179* 176* 169* 181*   Lipid Profile: No results for input(s): CHOL, HDL, LDLCALC, TRIG, CHOLHDL, LDLDIRECT in the last 72 hours. Thyroid Function Tests: No results for input(s): TSH, T4TOTAL, FREET4, T3FREE, THYROIDAB in the last 72 hours. Anemia Panel: No results for input(s): VITAMINB12, FOLATE, FERRITIN, TIBC, IRON, RETICCTPCT in the last 72 hours. Sepsis Labs: Recent Labs  Lab 08/11/21 1055 08/11/21 1243  LATICACIDVEN 1.4 1.1    Recent Results (from the past 240 hour(s))  Resp Panel by RT-PCR (Flu A&B, Covid) Nasopharyngeal Swab     Status: None   Collection Time: 08/11/21 10:55 AM   Specimen: Nasopharyngeal Swab; Nasopharyngeal(NP) swabs in vial transport medium   Result Value Ref Range Status   SARS Coronavirus 2 by RT PCR NEGATIVE NEGATIVE Final    Comment: (NOTE) SARS-CoV-2 target nucleic acids are  NOT DETECTED.  The SARS-CoV-2 RNA is generally detectable in upper respiratory specimens during the acute phase of infection. The lowest concentration of SARS-CoV-2 viral copies this assay can detect is 138 copies/mL. A negative result does not preclude SARS-Cov-2 infection and should not be used as the sole basis for treatment or other patient management decisions. A negative result may occur with  improper specimen collection/handling, submission of specimen other than nasopharyngeal swab, presence of viral mutation(s) within the areas targeted by this assay, and inadequate number of viral copies(<138 copies/mL). A negative result must be combined with clinical observations, patient history, and epidemiological information. The expected result is Negative.  Fact Sheet for Patients:  BloggerCourse.com  Fact Sheet for Healthcare Providers:  SeriousBroker.it  This test is no t yet approved or cleared by the [place] FDA and  has been authorized for detection and/or diagnosis of SARS-CoV-2 by FDA under an Emergency Use Authorization (EUA). This EUA will remain  in effect (meaning this test can be used) for the duration of the COVID-19 declaration under Section 564(b)(1) of the Act, 21 U.S.C.section 360bbb-3(b)(1), unless the authorization is terminated  or revoked sooner.       Influenza A by PCR NEGATIVE NEGATIVE Final   Influenza B by PCR NEGATIVE NEGATIVE Final    Comment: (NOTE) The Xpert Xpress SARS-CoV-2/FLU/RSV plus assay is intended as an aid in the diagnosis of influenza from Nasopharyngeal swab specimens and should not be used as a sole basis for treatment. Nasal washings and aspirates are unacceptable for Xpert Xpress SARS-CoV-2/FLU/RSV testing.  Fact Sheet for  Patients: BloggerCourse.com  Fact Sheet for Healthcare Providers: SeriousBroker.it  This test is not yet approved or cleared by the [place] FDA and has been authorized for detection and/or diagnosis of SARS-CoV-2 by FDA under an Emergency Use Authorization (EUA). This EUA will remain in effect (meaning this test can be used) for the duration of the COVID-19 declaration under Section 564(b)(1) of the Act, 21 U.S.C. section 360bbb-3(b)(1), unless the authorization is terminated or revoked.  Performed at [facility], [address] [place], [place] [address]   Culture, blood (routine x 2)     Status: None (Preliminary result)   Collection Time: 08/11/21 10:55 AM   Specimen: Site Not Specified; Blood  Result Value Ref Range Status   Specimen Description   Final    SITE NOT SPECIFIED Performed at [facility], [address], [place], [place] [address]    Special Requests   Final    BOTTLES DRAWN AEROBIC AND ANAEROBIC Blood Culture adequate volume Performed at [facility], [address] [place], [place] [address]    Culture   Final    NO GROWTH 2 DAYS Performed at [facility], [address] [place], [place] [address]    Report Status PENDING  Incomplete  Culture, blood (routine x 2)     Status: None (Preliminary result)   Collection Time: 08/11/21 11:41 AM   Specimen: Right Antecubital; Blood  Result Value Ref Range Status   Specimen Description   Final    RIGHT ANTECUBITAL Performed at [facility], [address], [place], [place] [address]    Special Requests   Final    BOTTLES DRAWN AEROBIC AND ANAEROBIC Blood Culture adequate volume Performed at [facility], [address], [place], [place] [address]    Culture   Final    NO GROWTH 2 DAYS Performed at [facility], [address], [place], [place]  [address]    Report Status PENDING  Incomplete  MRSA Next Gen by PCR, Nasal     Status: Abnormal   Collection Time: 08/11/21  7:42 PM   Specimen: Nasal Mucosa; Nasal Swab  Result Value Ref Range Status   MRSA by PCR Next Gen DETECTED (A) NOT DETECTED Final    Comment: RESULT CALLED TO, READ BACK BY AND VERIFIED WITH: RN JILL AT 2140 08/11/21 CRUICKSHANK A (NOTE) The GeneXpert MRSA Assay (FDA approved for NASAL specimens only), is one component of a comprehensive MRSA colonization surveillance program. It is not intended to diagnose MRSA infection nor to guide or monitor treatment for MRSA infections. Test performance is not FDA approved in patients less than 74 years old. Performed at [facility], [address], [place], [place] [address]      Radiology Studies: CT Soft Tissue Neck W Contrast  Result Date: 08/11/2021 CLINICAL DATA:  Left neck swelling and erythema, suspected abscess EXAM: CT NECK WITH CONTRAST TECHNIQUE: Multidetector CT imaging of the neck was performed using the standard protocol following the bolus administration of intravenous contrast. CONTRAST:  75mL OMNIPAQUE IOHEXOL 350 MG/ML SOLN COMPARISON:  CT neck 07/24/2021 FINDINGS: Pharynx and larynx: There is mucosal edema involving the left oropharynx, hypopharynx, extending to the supraglottic larynx. There is asymmetric effacement of the left vallecula and piriform sinus, similar to the appearance on the contralateral side on the prior study. There is mild inflammatory change in the left parapharyngeal space. The airway remains patent. No abscess is identified. Salivary glands: There is enlargement of the left submandibular gland, significantly increased compared to the prior study. The left parotid gland is not enlarged but has mild surrounding fat stranding. The right parotid and submandibular glands have normalized since the prior study. No salivary gland stones are identified. Thyroid: Unremarkable.  Lymph nodes: There is no pathologic lymphadenopathy in the neck. Vascular: There is bulky calcified atherosclerotic plaque at the bilateral carotid bulbs. Limited intracranial: The imaged portions of the intracranial compartment are unremarkable. Visualized orbits: The imaged globes and orbits are unremarkable. Mastoids and visualized paranasal sinuses: There is mild mucosal thickening in the left sphenoid sinus. The imaged paranasal sinuses and mastoid air cells are otherwise clear. Skeleton: There is degenerative change of the cervical spine most advanced at C6-C7. There is no acute osseous abnormality or aggressive osseous lesion. Upper chest: The lung apices are clear. Other: There is marked inflammatory fat stranding throughout the left face and neck with associated skin thickening. There is asymmetric thickening of the left platysma muscle and likely inflammation of the masseter muscle. The left sternocleidomastoid also appears mildly asymmetrically enlarged. No abscess is identified. IMPRESSION: 1. Extensive inflammation throughout the left face and neck detailed above involving the oropharynx, hypopharynx, and supraglottic larynx without evidence of airway compromise. No abscess is identified. 2. Left submandibular and likely parotid sialoadenitis with significant overlying inflammatory change suggesting associated cellulitis. No salivary gland stones identified. 3. Asymmetric enlargement of the musculature of the left face and neck as described above suggests infectious/inflammatory myositis. 4. Overall, the appearance described above is similar to the prior study from 07/24/2021, though on the contralateral side. The right-side of the face/neck has normalized in the interim. Electronically Signed   By: Lesia Hausen M.D.   On: 08/11/2021 13:02   ECHOCARDIOGRAM COMPLETE  Result Date: 08/12/2021    ECHOCARDIOGRAM REPORT   Patient Name:   Benjamin Barnett [facility] Date of  Exam: 08/12/2021 Medical Rec #:  [id number]          Height:       70.0 in Accession #:    [id number]        Weight:       200.3 lb Date of Birth:  [birth year]         BSA:          2.089 m Patient Age:    74 years          BP:           168/92 mmHg Patient Gender: M                 HR:           99 bpm. Exam Location:  Inpatient Procedure: 2D Echo, Color Doppler, Cardiac Doppler and Intracardiac            Opacification Agent Indications:    I51.7 Cardiomegaly  History:        Patient has no prior history of Echocardiogram examinations.                 Risk Factors:Hypertension and Dyslipidemia.  Sonographer:    Andee Lineman Referring Phys: [id number] Alessandra Bevels IMPRESSIONS  1. Left ventricular ejection fraction, by estimation, is 65 to 70%. The left ventricle has normal function. The left ventricle has no regional wall motion abnormalities. There is mild left ventricular hypertrophy. Left ventricular diastolic parameters were normal.  2. Right ventricular systolic function is normal. The right ventricular size is normal.  3. The mitral valve is grossly normal. Trivial mitral valve regurgitation. No evidence of mitral stenosis.  4. The aortic valve was not well visualized. There is mild calcification of the aortic valve. Aortic valve regurgitation is not visualized. Mild to moderate aortic valve sclerosis/calcification is present, without any evidence of aortic stenosis.  5. Aortic dilatation noted. There is mild dilatation of the ascending aorta, measuring 42 mm.  6. The inferior vena cava is normal in size with greater than 50% respiratory variability, suggesting right atrial pressure of 3 mmHg. Comparison(s): No prior Echocardiogram. Conclusion(s)/Recommendation(s): Otherwise normal echocardiogram, with minor abnormalities described in the report. FINDINGS  Left Ventricle: Left ventricular ejection fraction, by estimation, is 65 to 70%. The left ventricle has normal function. The left ventricle has no regional wall motion abnormalities. The left  ventricular internal cavity size was normal in size. There is  mild left ventricular hypertrophy. Left ventricular diastolic parameters were normal. Right Ventricle: The right ventricular size is normal. Right vetricular wall thickness was not well visualized. Right ventricular systolic function is normal. Left Atrium: Left atrial size was normal in size. Right Atrium: Right atrial size was normal in size. Pericardium: There is no evidence of pericardial effusion. Presence of pericardial fat pad. Mitral Valve: The mitral valve is grossly normal. Trivial mitral valve regurgitation. No evidence of mitral valve stenosis. Tricuspid Valve: The tricuspid valve is grossly normal. Tricuspid valve regurgitation is trivial. No evidence of tricuspid stenosis. Aortic Valve: The aortic valve was not well visualized. There is mild calcification of the aortic valve. Aortic valve regurgitation is not visualized. Mild to moderate aortic valve sclerosis/calcification is present, without any evidence of aortic stenosis. Aortic valve mean gradient measures 7.0 mmHg. Aortic valve peak gradient measures 16.6 mmHg. Aortic valve area, by VTI measures 2.79 cm. Pulmonic Valve: The pulmonic valve was not well visualized. Pulmonic valve regurgitation is not visualized. No evidence of pulmonic stenosis. Aorta: Aortic dilatation  noted. There is mild dilatation of the ascending aorta, measuring 42 mm. Venous: The inferior vena cava is normal in size with greater than 50% respiratory variability, suggesting right atrial pressure of 3 mmHg. IAS/Shunts: The atrial septum is grossly normal.  LEFT VENTRICLE PLAX 2D LVIDd:         4.20 cm     Diastology LVIDs:         2.90 cm     LV e' medial:    6.53 cm/s LV PW:         1.40 cm     LV E/e' medial:  8.6 LV IVS:        2.10 cm     LV e' lateral:   7.29 cm/s LVOT diam:     2.00 cm     LV E/e' lateral: 7.7 LV SV:         96 LV SV Index:   46 LVOT Area:     3.14 cm  LV Volumes (MOD) LV vol d, MOD A4C:  95.1 ml LV vol s, MOD A4C: 30.2 ml LV SV MOD A4C:     95.1 ml RIGHT VENTRICLE             IVC RV S prime:     17.60 cm/s  IVC diam: 1.70 cm TAPSE (M-mode): 3.8 cm LEFT ATRIUM             Index       RIGHT ATRIUM           Index LA diam:        2.30 cm 1.10 cm/m  RA Area:     16.00 cm LA Vol (A2C):   29.3 ml 14.03 ml/m RA Volume:   42.00 ml  20.11 ml/m LA Vol (A4C):   28.2 ml 13.50 ml/m LA Biplane Vol: 29.5 ml 14.12 ml/m  AORTIC VALVE AV Area (Vmax):    2.56 cm AV Area (Vmean):   2.67 cm AV Area (VTI):     2.79 cm AV Vmax:           204.00 cm/s AV Vmean:          119.000 cm/s AV VTI:            0.345 m AV Peak Grad:      16.6 mmHg AV Mean Grad:      7.0 mmHg LVOT Vmax:         166.00 cm/s LVOT Vmean:        101.000 cm/s LVOT VTI:          0.306 m LVOT/AV VTI ratio: 0.89  AORTA Ao Root diam: 3.80 cm Ao Asc diam:  4.20 cm MITRAL VALVE MV Area (PHT): 4.49 cm    SHUNTS MV E velocity: 56.15 cm/s  Systemic VTI:  0.31 m MV A velocity: 83.60 cm/s  Systemic Diam: 2.00 cm MV E/A ratio:  0.67 Jodelle Red MD Electronically signed by Jodelle Red MD Signature Date/Time: 08/12/2021/1:40:40 PM    Final      LOS: 2 days   Lanae Boast, MD [facility]  08/13/2021, 11:54 AM

## 2021-08-13 NOTE — TOC Progression Note (Signed)
Transition of Care [facility]) - Progression Note    Patient Details  Name: Benjamin Barnett MRN: [id number] Date of Birth: 08-17-1946  Transition of Care [facility]) CM/SW Contact  Geni Bers, RN Phone Number: 08/13/2021, 3:32 PM  Clinical Narrative:    Spoke with pt's daughter who asked for [facility] in [place]. A call was made to [facility], spoke with Tom. Fax information.    Expected Discharge Plan: Skilled Nursing Facility Barriers to Discharge: No Barriers Identified  Expected Discharge Plan and Services Expected Discharge Plan: Skilled Nursing Facility       Living arrangements for the past 2 months: Skilled Nursing Facility Expected Discharge Date:  (unknown)                                     Social Determinants of Health (SDOH) Interventions    Readmission Risk Interventions No flowsheet data found.

## 2021-08-14 DIAGNOSIS — G9341 Metabolic encephalopathy: Secondary | ICD-10-CM

## 2021-08-14 MED ORDER — AMOXICILLIN-POT CLAVULANATE 250-62.5 MG/5ML PO SUSR: 500.0000 mg | Freq: Two times a day (BID) | ORAL | 0 refills | Status: AC

## 2021-08-14 MED ORDER — DEXAMETHASONE 0.5 MG/5ML PO SOLN: 2.0000 mg | Freq: Every day | ORAL | 0 refills | Status: AC

## 2021-08-14 MED ORDER — LORAZEPAM 2 MG/ML PO CONC: 1.0000 mg | ORAL | 0 refills | Status: AC | PRN

## 2021-08-14 MED ORDER — HALOPERIDOL LACTATE 2 MG/ML PO CONC: 0.6000 mg | ORAL | 0 refills | Status: AC | PRN

## 2021-08-14 NOTE — TOC Progression Note (Signed)
Transition of Care [facility]) - Progression Note    Patient Details  Name: Benjamin Barnett MRN: [id number] Date of Birth: 07/31/1946  Transition of Care [facility]) CM/SW Contact  Geni Bers, RN Phone Number: 08/14/2021, 12:32 PM  Clinical Narrative:     Pt's daughter was called and is aware of pt being discharged to [facility] and Palliative Care. Daughter Marchelle Folks called and paid for pt's transportation to [place] related to miles from [place] to [place] is 75 miles/over 50.    Expected Discharge Plan: Skilled Nursing Facility Barriers to Discharge: No Barriers Identified  Expected Discharge Plan and Services Expected Discharge Plan: Skilled Nursing Facility       Living arrangements for the past 2 months: Skilled Nursing Facility Expected Discharge Date: 08/14/21                                     Social Determinants of Health (SDOH) Interventions    Readmission Risk Interventions No flowsheet data found.

## 2021-08-14 NOTE — Discharge Summary (Signed)
Physician Discharge Summary  Heinrich Fertig Moosman ZOX:[id number] DOB: 12-28-1945 DOA: 08/11/2021  PCP: System, Provider Not In  Admit date: 08/11/2021 Discharge date: 08/14/2021  Admitted From: SNF Disposition:  HOSPICE  Recommendations for Outpatient Follow-up:  Follow up with hospice/facility/pcp.  Home Health:NO  Equipment/Devices: NONE  Discharge Condition: Stable Code Status:   Code Status: DNR Diet recommendation:  Diet Order             Diet NPO time specified  Diet effective now                    Brief/Interim Summary:  75 year old male with multiple complex comorbidities with hypertension, hyperlipidemia, gout, GERD, right upper extremity DVT, prolonged hospitalization at [facility] in [place] (5/31 to 05/29/2021) for MRSA pneumonia/metabolic encephalopathy with PEA arrest, had right-sided sialadenitis during this hospitalization->subsequently transferred to [facility] and stayed there until 07/23/2021->discharged to [facility] and on the same day transferred back to [facility] for right-sided sialoadenitis, associated sepsis/AKI.  Discharged 8/21  presented with recurrent symptoms of facial swelling As per the daughter patient underwent PEG tube placement while at [facility] for malnutrition and apparently has been pretty much nonambulatory and bedbound since discharge from [facility]. Patient was admitted managed for recurrent sialoadenitis with sepsis treated with IV antibiotics steroids ENT was consulted in the ED.  Palliative care was reconsulted further goals of care were discussed and at this time patient daughter wished him to go to hospice.  Patient did not want to be on tube feeding for long-term. He has a bed today and is being discharged to hospice  Discharge Diagnoses:   Recurrent sialadenitis with sepsis: On admission leukocytosis febrile tachycardic.  Managed with IV fluids IV antibiotics Decadron.Per EDP:  ENT-Dr. Jenne Pane recommended conservative management.Respiratory status is stable, WBC count improved. Continue frequent suctioning to avoid respiratory distress due to retained secretion, continue scheduled/as needed nebulizer, cont Augmentinand decadron taper tot manage symptoms too. Last Labs          Recent Labs  Lab 08/11/21 1055 08/11/21 1243 08/11/21 1807 08/12/21 0415 08/13/21 0421  WBC 12.4*  --  13.4* 11.3* 10.9*  LATICACIDVEN 1.4 1.1  --   --   --       Status post PEG tube  Mild hypercalcemia-resolved. 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  EEG no seizure spikes during last admission.  Total care/bedbound/debility/failure to thrive: Nursing providing around the clock care, offloading to prevent Pressure sores Hypokalemia improved WJX:BJYNWGNF. Elevated troponin likely stress related due to sepsis.  Echo ordered in the ED-EF 65 to 70%, no regional WMA.   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 Discharge Exam: Vitals:   08/13/21 2028 08/14/21 0426  BP: (!) 155/88 (!) 158/90  Pulse: 85 90  Resp: 20 20  Temp: 98.7 F (37.1 C) 98.5 F (36.9 C)  SpO2: 95% 98%   General: Pt is alert, awake, not in acute distress Cardiovascular: RRR, S1/S2 +, no rubs, no gallops Respiratory: CTA bilaterally, no wheezing, no rhonchi Abdominal: Soft, NT, ND, bowel sounds + Extremities: no edema, no cyanosis  Discharge Instructions  Discharge Instructions     Discharge wound care:   Complete by: As directed  Every shift and prn incontinence cleaning, apply Triple paste to the buttocks, then sprinkle Antifungal powder (green and white bottle in clean utility) over the paste.  Also apply  antifungal powder to the groin and pannus areas.      Allergies as of 08/14/2021       Reactions   Darvon [propoxyphene]    Oxycodone    Codeine Rash   Furosemide Rash   Ibuprofen Rash   Meperidine Rash   Tape Other (See Comments)   Other reaction(s): Redness        Medication List     STOP taking these medications    amLODipine 10 MG tablet Commonly known as: NORVASC   free water Soln   insulin aspart 100 UNIT/ML injection Commonly known as: novoLOG   metoprolol tartrate 25 MG tablet Commonly known as: LOPRESSOR   Zinc Oxide 12.8 % ointment Commonly known as: TRIPLE PASTE       TAKE these medications    amantadine 50 MG/5ML solution Commonly known as: SYMMETREL Place 10 mLs (100 mg total) into feeding tube 2 (two) times daily.   amoxicillin-clavulanate 250-62.5 MG/5ML suspension Commonly known as: AUGMENTIN Take 10 mLs (500 mg total) by mouth 2 (two) times daily for 3 days.   chlorhexidine 0.12 % solution Commonly known as: PERIDEX 15 mLs by Mouth Rinse route 2 (two) times daily. What changed: how to take this   dexamethasone 0.5 MG/5ML solution Commonly known as: DECADRON Take 20 mLs (2 mg total) by mouth daily for 3 days.   feeding supplement (NEPRO CARB STEADY) Liqd Place 1,000 mLs into feeding tube continuous. What changed:  how much to take additional instructions   feeding supplement (PRO-STAT SUGAR FREE 64) Liqd Place 30 mLs into feeding tube in the morning and at bedtime.   haloperidol 2 MG/ML solution Commonly known as: HALDOL Place 0.3 mLs (0.6 mg total) under the tongue every 4 (four) hours as needed for up to 4 doses for agitation (or delirium).   ipratropium-albuterol 0.5-2.5 (3) MG/3ML Soln Commonly known as: DUONEB Take 3 mLs by nebulization 3 (three) times daily.   LORazepam 2 MG/ML concentrated solution Commonly known as: ATIVAN Place 0.5 mLs (1 mg total) under the tongue every 4 (four) hours as needed for up to 8 doses  for anxiety.   metoCLOPramide 10 MG tablet Commonly known as: REGLAN Place 1 tablet (10 mg total) into feeding tube 4 (four) times daily -  before meals and at bedtime. What changed: when to take this   mirtazapine 7.5 MG tablet Commonly known as: REMERON Place 1 tablet (7.5 mg total) into feeding tube at bedtime.   pantoprazole sodium 40 mg Pack Commonly known as: PROTONIX Place 20 mLs (40 mg total) into feeding tube daily.   QUEtiapine 50 MG tablet Commonly known as: SEROQUEL Place 1 tablet (50 mg total) into feeding tube at bedtime.               Discharge Care Instructions  (From admission, onward)           Start     Ordered   08/14/21 0000  Discharge wound care:       Comments: Every shift and prn incontinence cleaning, apply Triple paste to the buttocks, then sprinkle Antifungal powder (green and white bottle in clean utility) over the paste.  Also apply antifungal powder to the groin and pannus areas.   08/14/21 1116            Allergies  Allergen Reactions   Darvon [Propoxyphene]    Oxycodone    Codeine Rash   Furosemide Rash   Ibuprofen Rash   Meperidine Rash   Tape Other (See Comments)    Other reaction(s): Redness    The results of significant diagnostics from this hospitalization (including imaging, microbiology, ancillary and laboratory) are listed below for reference.    Microbiology: Recent Results (from the past 240 hour(s))  Resp Panel by RT-PCR (Flu A&B, Covid) Nasopharyngeal Swab     Status: None   Collection Time: 08/11/21 10:55 AM   Specimen: Nasopharyngeal Swab; Nasopharyngeal(NP) swabs in vial transport medium  Result Value Ref Range Status   SARS Coronavirus 2 by RT PCR NEGATIVE NEGATIVE Final    Comment: (NOTE) SARS-CoV-2 target nucleic acids are NOT DETECTED.  The SARS-CoV-2 RNA is generally detectable in upper respiratory specimens during the acute phase of infection. The lowest concentration of SARS-CoV-2 viral copies  this assay can detect is 138 copies/mL. A negative result does not preclude SARS-Cov-2 infection and should not be used as the sole basis for treatment or other patient management decisions. A negative result may occur with  improper specimen collection/handling, submission of specimen other than nasopharyngeal swab, presence of viral mutation(s) within the areas targeted by this assay, and inadequate number of viral copies(<138 copies/mL). A negative result must be combined with clinical observations, patient history, and epidemiological information. The expected result is Negative.  Fact Sheet for Patients:  BloggerCourse.com  Fact Sheet for Healthcare Providers:  SeriousBroker.it  This test is no t yet approved or cleared by the [place] FDA and  has been authorized for detection and/or diagnosis of SARS-CoV-2 by FDA under an Emergency Use Authorization (EUA). This EUA will remain  in effect (meaning this test can be used) for the duration of the COVID-19 declaration under Section 564(b)(1) of the Act, 21 U.S.C.section 360bbb-3(b)(1), unless the authorization is terminated  or revoked sooner.       Influenza A by PCR NEGATIVE NEGATIVE Final   Influenza B by PCR NEGATIVE NEGATIVE Final    Comment: (NOTE) The Xpert Xpress SARS-CoV-2/FLU/RSV plus assay is intended as an aid in the diagnosis of influenza from Nasopharyngeal swab specimens and should not be used as a sole basis for treatment. Nasal washings and aspirates are unacceptable for Xpert Xpress SARS-CoV-2/FLU/RSV testing.  Fact Sheet for Patients: BloggerCourse.com  Fact Sheet for Healthcare Providers: SeriousBroker.it  This test is not yet approved or cleared by the [place] FDA and has been authorized for detection and/or diagnosis of SARS-CoV-2 by FDA under an Emergency Use Authorization (EUA). This EUA  will remain in effect (meaning this test can be used) for the duration of the COVID-19 declaration under Section 564(b)(1) of the Act, 21 U.S.C. section 360bbb-3(b)(1), unless the authorization is terminated or revoked.  Performed at [facility], [address] [place], [place] [address]   Culture, blood (routine x 2)     Status: None (Preliminary result)   Collection Time: 08/11/21 10:55 AM   Specimen: Site Not Specified; Blood  Result Value Ref Range Status   Specimen Description   Final    SITE NOT SPECIFIED Performed at [facility], [address], [place], [place] [address]    Special Requests   Final    BOTTLES DRAWN AEROBIC AND ANAEROBIC Blood Culture adequate volume Performed at [facility], [address] [place], [place] [address]    Culture   Final  NO GROWTH 3 DAYS Performed at [facility], [address] [place], [place] [address]    Report Status PENDING  Incomplete  Culture, blood (routine x 2)     Status: None (Preliminary result)   Collection Time: 08/11/21 11:41 AM   Specimen: Right Antecubital; Blood  Result Value Ref Range Status   Specimen Description   Final    RIGHT ANTECUBITAL Performed at [facility], [address], [place], [place] [address]    Special Requests   Final    BOTTLES DRAWN AEROBIC AND ANAEROBIC Blood Culture adequate volume Performed at [facility], [address], [place], [place] [address]    Culture   Final    NO GROWTH 3 DAYS Performed at [facility], [address], [place], [place] [address]    Report Status PENDING  Incomplete  MRSA Next Gen by PCR, Nasal     Status: Abnormal   Collection Time: 08/11/21  7:42 PM   Specimen: Nasal Mucosa; Nasal Swab  Result Value Ref Range Status   MRSA by PCR Next Gen DETECTED (A) NOT DETECTED Final    Comment: RESULT CALLED TO, READ BACK BY AND VERIFIED WITH: RN JILL AT  2140 08/11/21 CRUICKSHANK A (NOTE) The GeneXpert MRSA Assay (FDA approved for NASAL specimens only), is one component of a comprehensive MRSA colonization surveillance program. It is not intended to diagnose MRSA infection nor to guide or monitor treatment for MRSA infections. Test performance is not FDA approved in patients less than 23 years old. Performed at [facility], [address] [place], [place] [address]     Procedures/Studies: CT HEAD WO CONTRAST ( )  Result Date: 07/23/2021 CLINICAL DATA:  Mental status change EXAM: CT HEAD WITHOUT CONTRAST TECHNIQUE: Contiguous axial images were obtained from the base of the skull through the vertex without intravenous contrast. COMPARISON:  CT brain 06/23/2021, MRI 06/25/2021 FINDINGS: Brain: No acute territorial infarction, hemorrhage or intracranial mass. Mild atrophy. Stable ventricular enlargement. Mild chronic small vessel ischemic changes of the white matter. Vascular: No hyperdense vessels.  Carotid vascular calcification. Skull: Normal. Negative for fracture or focal lesion. Sinuses/Orbits: No acute finding. Other: None IMPRESSION: 1. No CT evidence for acute intracranial abnormality. 2. Similar atrophy. Similar degree of ventricular enlargement probably due to atrophy though with normal pressure hydrocephalus also considered. This finding is not significantly changed Electronically Signed   By: Jasmine Pang M.D.   On: 07/23/2021 22:14   CT Soft Tissue Neck W Contrast  Result Date: 08/11/2021 CLINICAL DATA:  Left neck swelling and erythema, suspected abscess EXAM: CT NECK WITH CONTRAST TECHNIQUE: Multidetector CT imaging of the neck was performed using the standard protocol following the bolus administration of intravenous contrast. CONTRAST:  75mL OMNIPAQUE IOHEXOL 350 MG/ML SOLN COMPARISON:  CT neck 07/24/2021 FINDINGS: Pharynx and larynx: There is mucosal edema involving the left oropharynx, hypopharynx, extending to  the supraglottic larynx. There is asymmetric effacement of the left vallecula and piriform sinus, similar to the appearance on the contralateral side on the prior study. There is mild inflammatory change in the left parapharyngeal space. The airway remains patent. No abscess is identified. Salivary glands: There is enlargement of the left submandibular gland, significantly increased compared to the prior study. The left parotid gland is not enlarged but has mild surrounding fat stranding. The right parotid and submandibular glands have normalized since the prior study. No salivary gland stones are identified. Thyroid: Unremarkable. Lymph nodes: There  is no pathologic lymphadenopathy in the neck. Vascular: There is bulky calcified atherosclerotic plaque at the bilateral carotid bulbs. Limited intracranial: The imaged portions of the intracranial compartment are unremarkable. Visualized orbits: The imaged globes and orbits are unremarkable. Mastoids and visualized paranasal sinuses: There is mild mucosal thickening in the left sphenoid sinus. The imaged paranasal sinuses and mastoid air cells are otherwise clear. Skeleton: There is degenerative change of the cervical spine most advanced at C6-C7. There is no acute osseous abnormality or aggressive osseous lesion. Upper chest: The lung apices are clear. Other: There is marked inflammatory fat stranding throughout the left face and neck with associated skin thickening. There is asymmetric thickening of the left platysma muscle and likely inflammation of the masseter muscle. The left sternocleidomastoid also appears mildly asymmetrically enlarged. No abscess is identified. IMPRESSION: 1. Extensive inflammation throughout the left face and neck detailed above involving the oropharynx, hypopharynx, and supraglottic larynx without evidence of airway compromise. No abscess is identified. 2. Left submandibular and likely parotid sialoadenitis with significant overlying  inflammatory change suggesting associated cellulitis. No salivary gland stones identified. 3. Asymmetric enlargement of the musculature of the left face and neck as described above suggests infectious/inflammatory myositis. 4. Overall, the appearance described above is similar to the prior study from 07/24/2021, though on the contralateral side. The right-side of the face/neck has normalized in the interim. Electronically Signed   By: Lesia Hausen M.D.   On: 08/11/2021 13:02   CT SOFT TISSUE NECK W CONTRAST  Result Date: 07/24/2021 CLINICAL DATA:  75 year old male with  Septic Parotitis. EXAM: CT NECK WITH CONTRAST TECHNIQUE: Multidetector CT imaging of the neck was performed using the standard protocol following the bolus administration of intravenous contrast. CONTRAST:  80mL OMNIPAQUE IOHEXOL 350 MG/ML SOLN COMPARISON:  Brain MRI 06/25/2021. FINDINGS: Pharynx and larynx: There is bulky mucosal space edema extending along the right lateral wall of the pharynx to the supraglottic larynx. There is leftward midline shift of the airway at the level of the hyoid bone (series 4, image 89). Effaced right para form sinus. The nasopharynx is relatively spared. Despite this mucosal edema the right parapharyngeal space appears only mildly inflamed. No masslike enhancement of the larynx or pharynx. Left parapharyngeal and retropharyngeal spaces remain within normal limits. Salivary glands: Bulky, enlarged, and inflamed appearing right parotid gland (series 4, image 66). This resembles the appearance of the abnormal left parotid gland from the MRI last month, although the left parotid has normalized. No discrete right parotid mass. Regional soft tissue swelling and stranding tracking from the right parotid up the temporalis muscle and into the right submandibular space. The right submandibular gland is asymmetrically enlarged and hyperenhancing. No enlargement of the right parotid duct is evident. No enlargement of the  right submandibular duct. No sialolithiasis. The sublingual space appears relatively spared at this time. There is mild inflammation of the right masticator space. The left submandibular gland and parotid appear normal. Thyroid: Negative. Lymph nodes: Small but hyperenhancing right level 1 through level 3 lymph nodes appear reactive. No enlarged, cystic or necrotic lymph nodes in the neck. Vascular: The major vascular structures in the neck and at the skull base remain patent, although the right IJ is non dominant and diminutive. Bulky cervical carotid calcified plaque at the bifurcations with hemodynamically significant stenosis of the proximal left ICA on series 4, image 76. Tortuous ICAs just below the skull base. The left vertebral artery appears slightly dominant. Limited intracranial: Calcified atherosclerosis at the skull base.  Stable appearance of the visible brain parenchyma. Visualized orbits: Disconjugate gaze and chronic postoperative changes to the globes, otherwise negative orbits. Mastoids and visualized paranasal sinuses: Visualized paranasal sinuses and mastoids are clear. Skeleton: No acute dental finding. Ordinary cervical spine degeneration. Bulky upper cervical facet disease and lower cervical endplate spurring. Advanced degenerative osseous changes about the left shoulder. No acute or suspicious osseous lesion. Upper chest: Lungs appear clear with mild respiratory motion. Calcified aortic atherosclerosis. No superior mediastinal lymphadenopathy. IMPRESSION: 1. Multispatial inflammation and edema in the right face and neck, moderately involving the right lateral pharyngeal wall and the supraglottic larynx. However, no airway compromise at this time.  No associated abscess. 2. Multifocal right side sallow adenitis, with bulky and inflamed right parotid gland appearing similar to the abnormal left parotid on the brain MRI last month - although the left parotid has since normalized. Inflamed right  submandibular gland. No sialolithiasis or obstructing etiology identified. 3. Small but reactive appearing right cervical lymph nodes. 4. Bulky calcified carotid plaque with hemodynamically significant proximal Left ICA stenosis. 5. Aortic Atherosclerosis (ICD10-I70.0). Electronically Signed   By: Odessa Fleming M.D.   On: 07/24/2021 07:07   DG Chest Port 1 View  Result Date: 08/11/2021 CLINICAL DATA:  Fever EXAM: PORTABLE CHEST 1 VIEW COMPARISON:  Chest radiograph 08/01/2021 FINDINGS: The cardiomediastinal silhouette is stable. There is unchanged calcified atherosclerotic plaque at the aortic arch. There is unchanged elevation of the left hemidiaphragm. Linear opacities in the left base likely reflects subsegmental atelectasis. Otherwise, there is no focal consolidation or pulmonary edema. There is no significant pleural effusion. There is no pneumothorax. There is no acute osseous abnormality. IMPRESSION: No radiographic evidence of acute cardiopulmonary process. Electronically Signed   By: Lesia Hausen M.D.   On: 08/11/2021 11:33   DG Chest Port 1 View  Result Date: 08/01/2021 CLINICAL DATA:  Dyspnea. EXAM: PORTABLE CHEST 1 VIEW COMPARISON:  Chest radiograph dated 07/25/2021. FINDINGS: Evaluation is limited due to patient's positioning. There is elevation of the left hemidiaphragm. Left lung base hazy densities may represent atelectasis. Developing infiltrate is not excluded clinical correlation is recommended. There is no pleural effusion pneumothorax. Stable cardiac silhouette. Atherosclerotic calcification of the aorta. Degenerative changes of the spine and shoulders. No acute osseous pathology. IMPRESSION: Elevated left hemidiaphragm with left lung base atelectasis. Developing infiltrate is not excluded. Electronically Signed   By: Elgie Collard M.D.   On: 08/01/2021 02:37   DG CHEST PORT 1 VIEW  Result Date: 07/25/2021 CLINICAL DATA:  Shortness of breath EXAM: PORTABLE CHEST 1 VIEW COMPARISON:   July 24, 2021 FINDINGS: The ETT is in good position. The cardiomediastinal silhouette is stable. No pneumothorax. No pulmonary edema. Mild bibasilar opacities persist. No other interval changes. IMPRESSION: 1. Support apparatus as above. 2. Mild bibasilar opacities may represent subtle infiltrate such as pneumonia or aspiration. Recommend attention on follow-up. Electronically Signed   By: Gerome Sam III M.D.   On: 07/25/2021 13:01   DG Chest Port 1 View  Result Date: 07/24/2021 CLINICAL DATA:  Acute respiratory failure with hypoxia. EXAM: PORTABLE CHEST 1 VIEW COMPARISON:  One-view chest x-ray 07/23/2021 and 07/06/2021. FINDINGS: The patient is now intubated. Heart size is. Atherosclerotic calcifications are present the aortic arch. Increased pulmonary vascular congestion is present. Increasing bibasilar airspace disease is present, left greater than right. Advanced degenerative changes are noted in the left shoulder. IMPRESSION: 1. Interval intubation. 2. Increased pulmonary vascular congestion. 3. Increasing bibasilar airspace disease, left greater than right. This  is concerning for infection or aspiration. Electronically Signed   By: Marin Roberts M.D.   On: 07/24/2021 19:09   DG Chest Port 1 View  Result Date: 07/23/2021 CLINICAL DATA:  Altered mental status EXAM: PORTABLE CHEST 1 VIEW COMPARISON:  07/06/2021 FINDINGS: The heart size and mediastinal contours are within normal limits. Aortic atherosclerosis. Both lungs are clear. The visualized skeletal structures are unremarkable. IMPRESSION: No active disease. Electronically Signed   By: Jasmine Pang M.D.   On: 07/23/2021 20:46   EEG adult  Result Date: 07/30/2021 Charlsie Quest, MD     07/30/2021  8:52 AM Patient Name: Samuele Storey Younce MRN: [id number] Epilepsy Attending: Charlsie Quest Referring Physician/Provider: Dr Albertine Grates Date: 07/29/2021 Duration: 23.56 mins Patient history: 75 year old male with altered mental status.  EEG  to evaluate for seizures. Level of alertness: Awake AEDs during EEG study: None Technical aspects: This EEG study was done with scalp electrodes positioned according to the 10-20 International system of electrode placement. Electrical activity was acquired at a sampling rate of  and reviewed with a high frequency filter of  and a low frequency filter of . EEG data were recorded continuously and digitally stored. Description: EEG showed continuous generalized 5 to 6 Hz theta as well as intermittent generalized 2-3Hz  delta slowing. Hyperventilation and photic stimulation were not performed.   ABNORMALITY - Continuous slow, generalized IMPRESSION: This study is suggestive of moderate diffuse encephalopathy, nonspecific etiology. No seizures or epileptiform discharges were seen throughout the recording. Charlsie Quest   ECHOCARDIOGRAM COMPLETE  Result Date: 08/12/2021    ECHOCARDIOGRAM REPORT   Patient Name:   LIZANDRO SPELLMAN [facility] Date of Exam: 08/12/2021 Medical Rec #:  [id number]         Height:       70.0 in Accession #:    [id number]        Weight:       200.3 lb Date of Birth:  02-Oct-1946         BSA:          2.089 m Patient Age:    74 years          BP:           168/92 mmHg Patient Gender: M                 HR:           99 bpm. Exam Location:  Inpatient Procedure: 2D Echo, Color Doppler, Cardiac Doppler and Intracardiac            Opacification Agent Indications:    I51.7 Cardiomegaly  History:        Patient has no prior history of Echocardiogram examinations.                 Risk Factors:Hypertension and Dyslipidemia.  Sonographer:    Andee Lineman Referring Phys: [id number] Alessandra Bevels IMPRESSIONS  1. Left ventricular ejection fraction, by estimation, is 65 to 70%. The left ventricle has normal function. The left ventricle has no regional wall motion abnormalities. There is mild left ventricular hypertrophy. Left ventricular diastolic parameters were normal.  2. Right ventricular systolic  function is normal. The right ventricular size is normal.  3. The mitral valve is grossly normal. Trivial mitral valve regurgitation. No evidence of mitral stenosis.  4. The aortic valve was not well visualized. There is mild calcification of the aortic valve. Aortic valve regurgitation is not visualized. Mild to moderate aortic valve sclerosis/calcification  is present, without any evidence of aortic stenosis.  5. Aortic dilatation noted. There is mild dilatation of the ascending aorta, measuring 42 mm.  6. The inferior vena cava is normal in size with greater than 50% respiratory variability, suggesting right atrial pressure of 3 mmHg. Comparison(s): No prior Echocardiogram. Conclusion(s)/Recommendation(s): Otherwise normal echocardiogram, with minor abnormalities described in the report. FINDINGS  Left Ventricle: Left ventricular ejection fraction, by estimation, is 65 to 70%. The left ventricle has normal function. The left ventricle has no regional wall motion abnormalities. The left ventricular internal cavity size was normal in size. There is  mild left ventricular hypertrophy. Left ventricular diastolic parameters were normal. Right Ventricle: The right ventricular size is normal. Right vetricular wall thickness was not well visualized. Right ventricular systolic function is normal. Left Atrium: Left atrial size was normal in size. Right Atrium: Right atrial size was normal in size. Pericardium: There is no evidence of pericardial effusion. Presence of pericardial fat pad. Mitral Valve: The mitral valve is grossly normal. Trivial mitral valve regurgitation. No evidence of mitral valve stenosis. Tricuspid Valve: The tricuspid valve is grossly normal. Tricuspid valve regurgitation is trivial. No evidence of tricuspid stenosis. Aortic Valve: The aortic valve was not well visualized. There is mild calcification of the aortic valve. Aortic valve regurgitation is not visualized. Mild to moderate aortic valve  sclerosis/calcification is present, without any evidence of aortic stenosis. Aortic valve mean gradient measures 7.0 mmHg. Aortic valve peak gradient measures 16.6 mmHg. Aortic valve area, by VTI measures 2.79 cm. Pulmonic Valve: The pulmonic valve was not well visualized. Pulmonic valve regurgitation is not visualized. No evidence of pulmonic stenosis. Aorta: Aortic dilatation noted. There is mild dilatation of the ascending aorta, measuring 42 mm. Venous: The inferior vena cava is normal in size with greater than 50% respiratory variability, suggesting right atrial pressure of 3 mmHg. IAS/Shunts: The atrial septum is grossly normal.  LEFT VENTRICLE PLAX 2D LVIDd:         4.20 cm     Diastology LVIDs:         2.90 cm     LV e' medial:    6.53 cm/s LV PW:         1.40 cm     LV E/e' medial:  8.6 LV IVS:        2.10 cm     LV e' lateral:   7.29 cm/s LVOT diam:     2.00 cm     LV E/e' lateral: 7.7 LV SV:         96 LV SV Index:   46 LVOT Area:     3.14 cm  LV Volumes (MOD) LV vol d, MOD A4C: 95.1 ml LV vol s, MOD A4C: 30.2 ml LV SV MOD A4C:     95.1 ml RIGHT VENTRICLE             IVC RV S prime:     17.60 cm/s  IVC diam: 1.70 cm TAPSE (M-mode): 3.8 cm LEFT ATRIUM             Index       RIGHT ATRIUM           Index LA diam:        2.30 cm 1.10 cm/m  RA Area:     16.00 cm LA Vol (A2C):   29.3 ml 14.03 ml/m RA Volume:   42.00 ml  20.11 ml/m LA Vol (A4C):   28.2 ml 13.50 ml/m LA  Biplane Vol: 29.5 ml 14.12 ml/m  AORTIC VALVE AV Area (Vmax):    2.56 cm AV Area (Vmean):   2.67 cm AV Area (VTI):     2.79 cm AV Vmax:           204.00 cm/s AV Vmean:          119.000 cm/s AV VTI:            0.345 m AV Peak Grad:      16.6 mmHg AV Mean Grad:      7.0 mmHg LVOT Vmax:         166.00 cm/s LVOT Vmean:        101.000 cm/s LVOT VTI:          0.306 m LVOT/AV VTI ratio: 0.89  AORTA Ao Root diam: 3.80 cm Ao Asc diam:  4.20 cm MITRAL VALVE MV Area (PHT): 4.49 cm    SHUNTS MV E velocity: 56.15 cm/s  Systemic VTI:  0.31 m MV A  velocity: 83.60 cm/s  Systemic Diam: 2.00 cm MV E/A ratio:  0.67 Jodelle Red MD Electronically signed by Jodelle Red MD Signature Date/Time: 08/12/2021/1:40:40 PM    Final    CT Renal Stone Study  Result Date: 07/23/2021 CLINICAL DATA:  History of retroperitoneal bleed EXAM: CT ABDOMEN AND PELVIS WITHOUT CONTRAST TECHNIQUE: Multidetector CT imaging of the abdomen and pelvis was performed following the standard protocol without IV contrast. COMPARISON:  CT 06/21/1999 IV FINDINGS: Lower chest: Lung bases demonstrate no acute consolidation or effusion. Coronary vascular calcification. Hepatobiliary: No focal liver abnormality is seen. Status post cholecystectomy. No biliary dilatation. Pancreas: Unremarkable. No pancreatic ductal dilatation or surrounding inflammatory changes. Spleen: Normal in size without focal abnormality. Adrenals/Urinary Tract: Adrenal glands are normal. 9 cm right renal cyst. Small nonobstructing stone in the right kidney. No hydronephrosis. 10 mm stone in the bladder. Bladder slightly thick walled Stomach/Bowel: Stomach contains percutaneous gastrostomy tube. No dilated small bowel. No acute bowel wall thickening. Negative appendix. Diverticular disease of the left colon. Vascular/Lymphatic: Mild aortic atherosclerosis. No aneurysm. No suspicious nodes Reproductive: Prostate is unremarkable. Other: Negative for free air or free fluid. Fat containing left inguinal hernia Musculoskeletal: Degenerative changes of the spine. Residual asymmetric enlargement of the right greater than left psoas consistent with resolving retroperitoneal hematoma. This has decreased compared to the prior exam. IMPRESSION: 1. No significant hydronephrosis.  10 mm stone in the bladder. 2. Residual asymmetric enlargement of right greater than left psoas muscle though decreased compared to prior CT from July, consistent with resolving retroperitoneal hematoma. 3. 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 4. Nonobstructing right kidney stone Electronically Signed   By: Jasmine Pang M.D.   On: 07/23/2021 22:01    Labs: BNP (last 3 results) No results for input(s): BNP in the last 8760 hours. Basic Metabolic Panel: Recent Labs  Lab 08/11/21 1055 08/12/21 0415 08/13/21 0421  NA 140 145 145  K 3.3* 4.1 3.6  CL 107 112* 113*  CO2 GLUCOSE 153* 185* 196*  BUN 28* 32* 40*  CREATININE 1.19 1.26* 1.04  CALCIUM 10.3 10.4* 9.9   Liver Function Tests: Recent Labs  Lab 08/11/21 1055 08/12/21 0415 08/13/21 0421  AST ALT ALKPHOS 58 48 49  BILITOT 0.6 0.7 0.5  PROT 7.9 7.9 7.6  ALBUMIN 3.2* 3.1* 3.0*   No results for input(s): LIPASE, AMYLASE in the last 168 hours. No results  for input(s): AMMONIA in the last 168 hours. CBC: Recent Labs  Lab 08/11/21 1055 08/12/21 0415 08/13/21 0421  WBC 12.4* 11.3* 10.9*  NEUTROABS 9.9*  --  9.8*  HGB 13.5 12.8* 11.5*  HCT 41.9 40.6 35.2*  MCV 87.5 88.3 86.9  PLT 223 185 179   Cardiac Enzymes: No results for input(s): CKTOTAL, CKMB, CKMBINDEX, TROPONINI in the last 168 hours. BNP: Invalid input(s): POCBNP CBG: Recent Labs  Lab 08/13/21 0127 08/13/21 0620 08/13/21 0732 08/13/21 1122 08/13/21 1716  GLUCAP 179* 176* 169* 181* 147*   D-Dimer No results for input(s): DDIMER in the last 72 hours. Hgb A1c No results for input(s): HGBA1C in the last 72 hours. Lipid Profile No results for input(s): CHOL, HDL, LDLCALC, TRIG, CHOLHDL, LDLDIRECT in the last 72 hours. Thyroid function studies No results for input(s): TSH, T4TOTAL, T3FREE, THYROIDAB in the last 72 hours.  Invalid input(s): FREET3 Anemia work up No results for input(s): VITAMINB12, FOLATE, FERRITIN, TIBC, IRON, RETICCTPCT in the last 72 hours. Urinalysis    Component Value Date/Time   COLORURINE YELLOW 08/11/2021 1124   APPEARANCEUR CLOUDY (A) 08/11/2021 1124   LABSPEC 1.017 08/11/2021 1124   PHURINE 5.0  08/11/2021 1124   GLUCOSEU NEGATIVE 08/11/2021 1124   HGBUR NEGATIVE 08/11/2021 1124   BILIRUBINUR NEGATIVE 08/11/2021 1124   KETONESUR NEGATIVE 08/11/2021 1124   PROTEINUR 30 (A) 08/11/2021 1124   NITRITE NEGATIVE 08/11/2021 1124   LEUKOCYTESUR TRACE (A) 08/11/2021 1124   Sepsis Labs Invalid input(s): PROCALCITONIN,  WBC,  LACTICIDVEN Microbiology Recent Results (from the past 240 hour(s))  Resp Panel by RT-PCR (Flu A&B, Covid) Nasopharyngeal Swab     Status: None   Collection Time: 08/11/21 10:55 AM   Specimen: Nasopharyngeal Swab; Nasopharyngeal(NP) swabs in vial transport medium  Result Value Ref Range Status   SARS Coronavirus 2 by RT PCR NEGATIVE NEGATIVE Final    Comment: (NOTE) SARS-CoV-2 target nucleic acids are NOT DETECTED.  The SARS-CoV-2 RNA is generally detectable in upper respiratory specimens during the acute phase of infection. The lowest concentration of SARS-CoV-2 viral copies this assay can detect is 138 copies/mL. A negative result does not preclude SARS-Cov-2 infection and should not be used as the sole basis for treatment or other patient management decisions. A negative result may occur with  improper specimen collection/handling, submission of specimen other than nasopharyngeal swab, presence of viral mutation(s) within the areas targeted by this assay, and inadequate number of viral copies(<138 copies/mL). A negative result must be combined with clinical observations, patient history, and epidemiological information. The expected result is Negative.  Fact Sheet for Patients:  BloggerCourse.com  Fact Sheet for Healthcare Providers:  SeriousBroker.it  This test is no t yet approved or cleared by the [place] FDA and  has been authorized for detection and/or diagnosis of SARS-CoV-2 by FDA under an Emergency Use Authorization (EUA). This EUA will remain  in effect (meaning this test can be used) for  the duration of the COVID-19 declaration under Section 564(b)(1) of the Act, 21 U.S.C.section 360bbb-3(b)(1), unless the authorization is terminated  or revoked sooner.       Influenza A by PCR NEGATIVE NEGATIVE Final   Influenza B by PCR NEGATIVE NEGATIVE Final    Comment: (NOTE) The Xpert Xpress SARS-CoV-2/FLU/RSV plus assay is intended as an aid in the diagnosis of influenza from Nasopharyngeal swab specimens and should not be used as a sole basis for treatment. Nasal washings and aspirates are unacceptable for Xpert Xpress SARS-CoV-2/FLU/RSV testing.  Fact Sheet for Patients: BloggerCourse.com  Fact Sheet for Healthcare Providers: SeriousBroker.it  This test is not yet approved or cleared by the [place] FDA and has been authorized for detection and/or diagnosis of SARS-CoV-2 by FDA under an Emergency Use Authorization (EUA). This EUA will remain in effect (meaning this test can be used) for the duration of the COVID-19 declaration under Section 564(b)(1) of the Act, 21 U.S.C. section 360bbb-3(b)(1), unless the authorization is terminated or revoked.  Performed at [facility], [address] [place], [place] [address]   Culture, blood (routine x 2)     Status: None (Preliminary result)   Collection Time: 08/11/21 10:55 AM   Specimen: Site Not Specified; Blood  Result Value Ref Range Status   Specimen Description   Final    SITE NOT SPECIFIED Performed at [facility], [address], [place], [place] [address]    Special Requests   Final    BOTTLES DRAWN AEROBIC AND ANAEROBIC Blood Culture adequate volume Performed at [facility], [address], [place], [place] [address]    Culture   Final    NO GROWTH 3 DAYS Performed at [facility], [address] [place], [place] [address]    Report Status PENDING  Incomplete  Culture, blood (routine x 2)      Status: None (Preliminary result)   Collection Time: 08/11/21 11:41 AM   Specimen: Right Antecubital; Blood  Result Value Ref Range Status   Specimen Description   Final    RIGHT ANTECUBITAL Performed at [facility], [address], [place], [place] [address]    Special Requests   Final    BOTTLES DRAWN AEROBIC AND ANAEROBIC Blood Culture adequate volume Performed at [facility], [address] [place], [place] [address]    Culture   Final    NO GROWTH 3 DAYS Performed at [facility], [address] [place], [place] [address]    Report Status PENDING  Incomplete  MRSA Next Gen by PCR, Nasal     Status: Abnormal   Collection Time: 08/11/21  7:42 PM   Specimen: Nasal Mucosa; Nasal Swab  Result Value Ref Range Status   MRSA by PCR Next Gen DETECTED (A) NOT DETECTED Final    Comment: RESULT CALLED TO, READ BACK BY AND VERIFIED WITH: RN JILL AT 2140 08/11/21 CRUICKSHANK A (NOTE) The GeneXpert MRSA Assay (FDA approved for NASAL specimens only), is one component of a comprehensive MRSA colonization surveillance program. It is not intended to diagnose MRSA infection nor to guide or monitor treatment for MRSA infections. Test performance is not FDA approved in patients less than 53 years old. Performed at [facility], [address], [place], [place] [address]      Time coordinating discharge: 35 minutes  SIGNED: Lanae Boast, MD  [facility] 08/14/2021, 11:16 AM  If 7PM-7AM, please contact night-coverage [email or web address]

## 2021-08-14 NOTE — Progress Notes (Signed)
Daily Progress Note   Patient Name: Benjamin Barnett       Date: 08/14/2021 DOB: 11-05-1946  Age: 75 y.o. MRN#: [id number] Attending Physician: Lanae Boast, MD Primary Care Physician: System, Provider Not In Admit Date: 08/11/2021  Reason for Consultation/Follow-up: Terminal Care  Subjective:  Awake and attempts to mumble some words.   Length of Stay: 3  Current Medications: Scheduled Meds:   amantadine  100 mg Per Tube BID   dexamethasone (DECADRON) injection  4 mg Intravenous Q12H   glycopyrrolate  0.2 mg Intravenous Q4H   metoCLOPramide  10 mg Per Tube TID AC & HS   mirtazapine  7.5 mg Per Tube QHS   mupirocin ointment  1 application Nasal BID   pantoprazole sodium  40 mg Per Tube Daily   QUEtiapine  50 mg Per Tube QHS   Zinc Oxide   Topical BID    Continuous Infusions:  ampicillin-sulbactam (UNASYN) IV 1.5 g (08/14/21 0823)    PRN Meds: acetaminophen **OR** acetaminophen, bisacodyl, haloperidol **OR** haloperidol **OR** haloperidol lactate, ipratropium-albuterol, LORazepam **OR** LORazepam **OR** LORazepam, morphine injection  Physical Exam         Appears with significant weakness and generalized deconditioning.  Appears chronically ill Has systolic murmur Coarse breath sounds Has PEG which is being used for medications.  Some edema Alert  Vital Signs: BP (!) 158/90 (BP Location: Right Arm)   Pulse 90   Temp 98.5 F (36.9 C) (Axillary)   Resp 20   Wt 90.9 kg   SpO2 98%   BMI 28.74 kg/m  SpO2: SpO2: 98 % O2 Device: O2 Device: Room Air O2 Flow Rate: O2 Flow Rate (L/min): 3 L/min  Intake/output summary:  Intake/Output Summary (Last 24 hours) at 08/14/2021 1047 Last data filed at 08/14/2021 0600 Gross per 24 hour  Intake 200 ml  Output 851 ml  Net -651 ml    LBM: Last BM Date: 08/13/21 Baseline Weight: Weight: 90.9 kg Most recent weight: Weight: 90.9 kg       Palliative Assessment/Data:      Patient Active Problem List   Diagnosis Date Noted   Sialadenitis 08/11/2021   Pressure injury of skin 07/27/2021   Acute suppurative parotitis 07/24/2021   Acute metabolic encephalopathy 07/24/2021   Protein calorie malnutrition (HCC) 07/24/2021   GERD without esophagitis 07/24/2021  Lactic acidosis 07/24/2021   Hypokalemia 07/24/2021   Essential hypertension 07/24/2021   Mixed hyperlipidemia 07/24/2021   Gout 07/24/2021   Cardiac arrest (HCC) 07/24/2021   Sepsis (HCC) 07/23/2021    Palliative Care Assessment & Plan   Patient Profile:    Assessment:  Patient with recurrent sepsis and sialadenitis with persistent minimal responsiveness.  Recent multiple acute health insults including PEA arrest a couple months ago.  He is likely to continue to have health insults and continue his downward trajectory/decline toward end of life. PMT discussed the situation with his daughter extensively and she has made a decision to shift towards comfort care and focus on quality rather than cure.  She is requested to have him enrolled with hospice and palliative care of [facility].  Recommendations/Plan: Continue comfort measures Transfer to residential hospice when bed available. Call placed and re discussed current plan of care and disposition option with daughter Marchelle Folks.    Goals of Care and Additional Recommendations: Limitations on Scope of Treatment: Full Comfort Care  Code Status:    Code Status Orders  (From admission, onward)           Start     Ordered   08/13/21 1438  Do not attempt resuscitation (DNR)  Continuous       Question Answer Comment  In the event of cardiac or respiratory ARREST Do not call a "code blue"   In the event of cardiac or respiratory ARREST Do not perform Intubation, CPR, defibrillation or ACLS   In  the event of cardiac or respiratory ARREST Use medication by any route, position, wound care, and other measures to relive pain and suffering. May use oxygen, suction and manual treatment of airway obstruction as needed for comfort.      08/13/21 1440           Code Status History     Date Active Date Inactive Code Status Order ID Comments User Context   08/11/2021 1734 08/13/2021 1440 DNR [id number]  Alessandra Bevels, MD ED   07/31/2021 1704 08/03/2021 0032 DNR [id number]  Glee Arvin, NP Inpatient   07/24/2021 0242 07/31/2021 1704 Full Code [id number]  Marinda Elk, MD ED   06/25/2021 1721 07/23/2021 1930 DNR [id number]  Heide Spark Inpatient       Prognosis:  < 2 weeks  Discharge Planning: Hospice facility  Care plan was discussed with  patient and bedside RN. Call placed and also discussed with daughter Marchelle Folks.   Thank you for allowing the Palliative Medicine Team to assist in the care of this patient.   Time In: 10 Time Out: 10.25 Total Time 25 Prolonged Time Billed  no       Greater than 50%  of this time was spent counseling and coordinating care related to the above assessment and plan.  Rosalin Hawking, MD  Please contact Palliative Medicine Team phone at [phone] for questions and concerns.

## 2021-08-16 LAB — CULTURE, BLOOD (ROUTINE X 2)
Culture: NO GROWTH
Culture: NO GROWTH
Special Requests: ADEQUATE
Special Requests: ADEQUATE

## 2021-09-12 DEATH — deceased

## 2022-03-03 IMAGING — MR MR HEAD W/O CM
13 of 14 series · 44 of 48 positions shown · non-contrast
Comparison: Prior CT from 06/23/2021.

CLINICAL DATA: Initial evaluation for hydrocephalus.

EXAM:
MRI HEAD WITHOUT CONTRAST
TECHNIQUE: Multiplanar, multiecho pulse sequences of the brain and surrounding
structures were obtained without intravenous contrast.

[Series 5: DWI · axial · 3.0mm · 0.88mm/px · z∈[-32,+116]mm · 7 of 108 slices shown (1 of 4)]
[im 1/108]
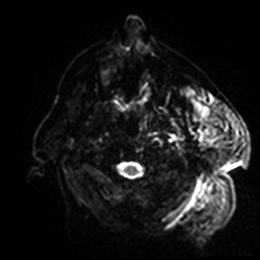
[im 18/108]
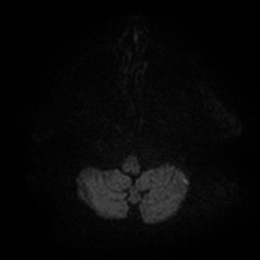
[im 36/108]
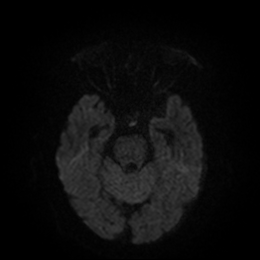
[im 54/108]
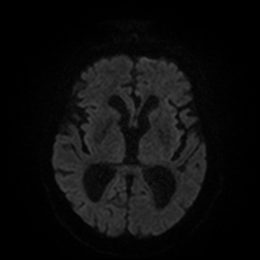
[im 72/108]
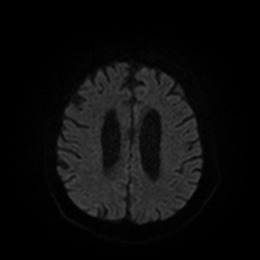
[im 90/108]
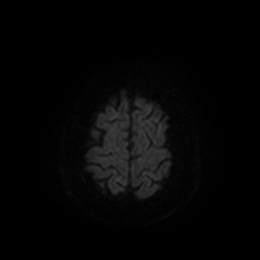
[im 108/108]
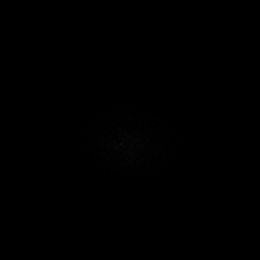

[Series 6: DWI · axial · 3.0mm · 0.88mm/px · z∈[-32,+116]mm · 4 of 54 slices shown (2 of 4)]
[im 1/54]
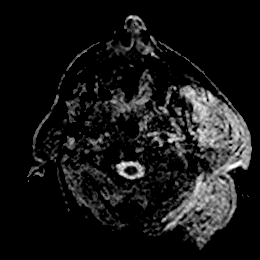
[im 18/54]
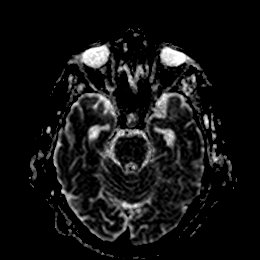
[im 36/54]
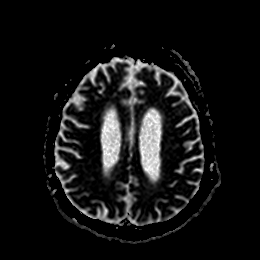
[im 54/54]
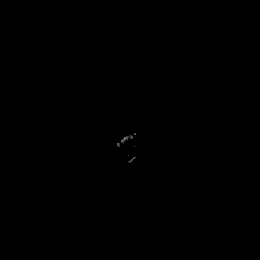

[Series 7: DWI · coronal · 4.0mm · 0.88mm/px · 5 of 72 slices shown (3 of 4)]
[im 1/72]
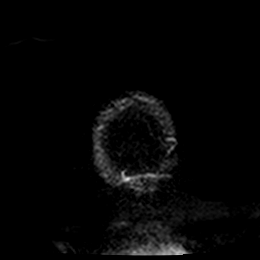
[im 18/72]
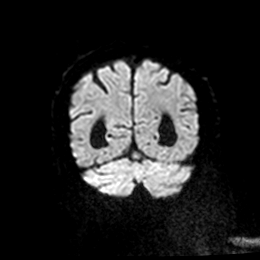
[im 36/72]
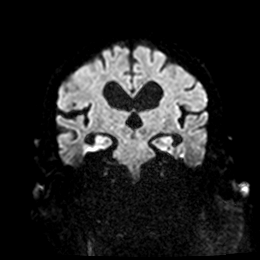
[im 54/72]
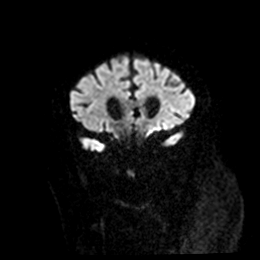
[im 72/72]
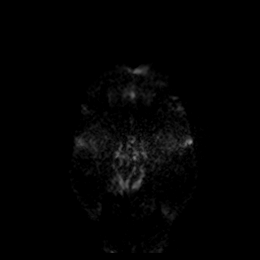

[Series 8: DWI · coronal · 4.0mm · 0.88mm/px · 3 of 36 slices shown (4 of 4)]
[im 1/36]
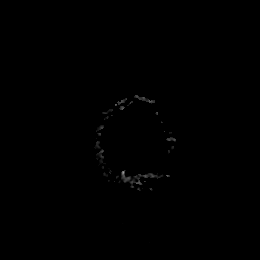
[im 18/36]
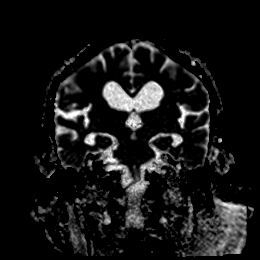
[im 36/36]
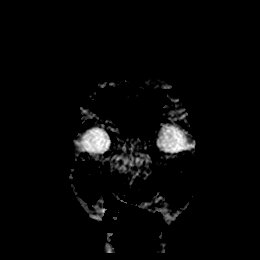

[Series 9: T1 · sagittal · 5.0mm · 0.75mm/px · 2 of 25 slices shown]
[im 1/25]
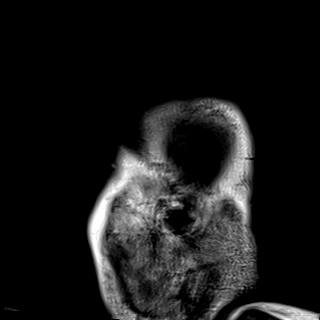
[im 25/25]
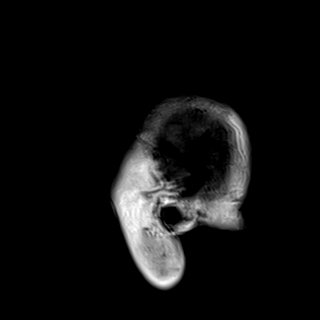

[Series 10: T2 · axial · 5.0mm · 0.72mm/px · z∈[-34,+118]mm · 2 of 28 slices shown (1 of 2)]
[im 1/28]
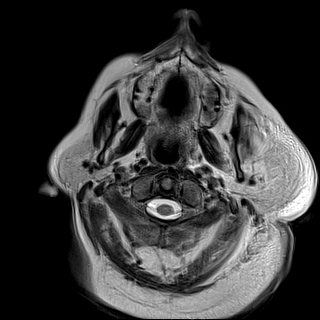
[im 28/28]
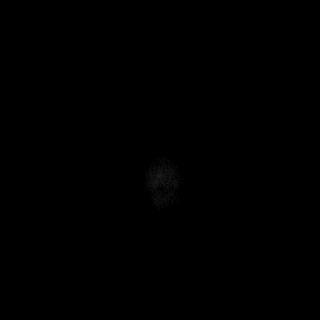

[Series 11: FLAIR · axial · 5.0mm · 0.90mm/px · z∈[-31,+115]mm · 2 of 27 slices shown]
[im 1/27]
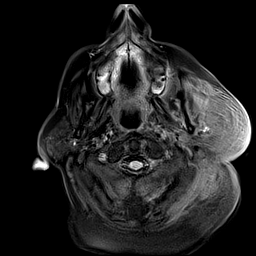
[im 27/27]
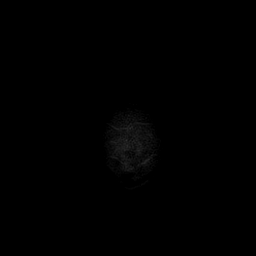

[Series 12: mag_images · axial · 3.0mm · 0.90mm/px · z∈[-38,+117]mm · 4 of 56 slices shown]
[im 1/56]
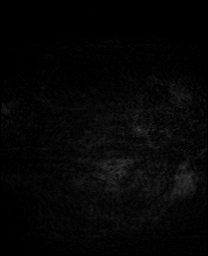
[im 19/56]
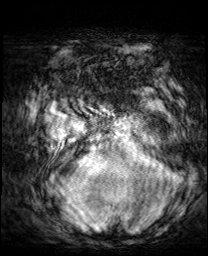
[im 37/56]
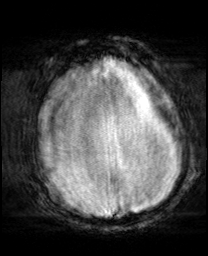
[im 56/56]
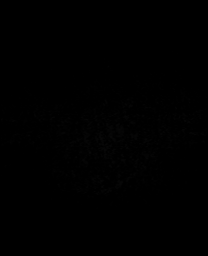

[Series 13: pha_images · axial · 3.0mm · 0.90mm/px · z∈[-24,+103]mm · 3 of 36 slices shown]
[im 1/36]
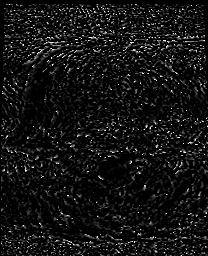
[im 18/36]
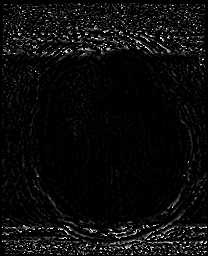
[im 36/36]
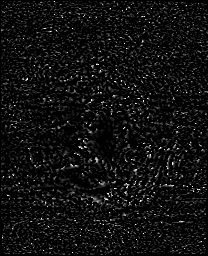

[Series 14: swi_images · axial · 3.0mm · 0.90mm/px · z∈[-38,+117]mm · 4 of 56 slices shown]
[im 1/56]
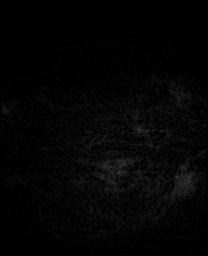
[im 19/56]
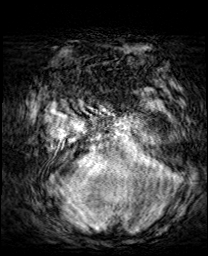
[im 37/56]
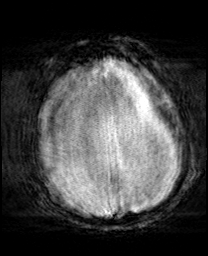
[im 56/56]
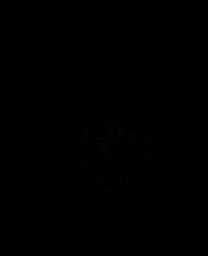

[Series 15: mip_images(sw) · axial · 24.0mm · 0.90mm/px · z∈[-28,+107]mm · 4 of 49 slices shown]
[im 1/49]
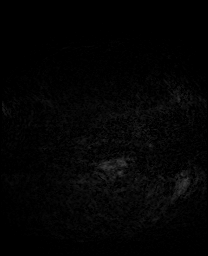
[im 17/49]
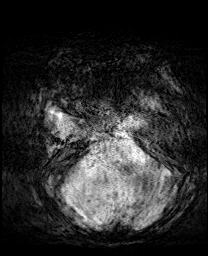
[im 33/49]
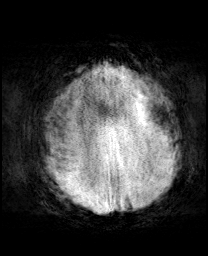
[im 49/49]
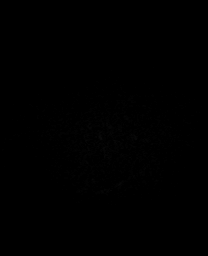

[Series 17: ax hemo · axial · 5.0mm · 0.86mm/px · z∈[-35,+111]mm · 2 of 27 slices shown]
[im 1/27]
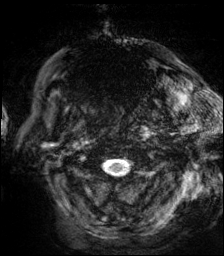
[im 27/27]
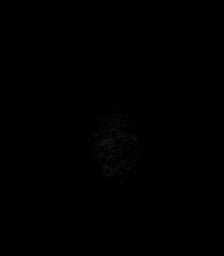

[Series 18: T2 · coronal · 5.0mm · 0.72mm/px · 2 of 28 slices shown (2 of 2)]
[im 1/28]
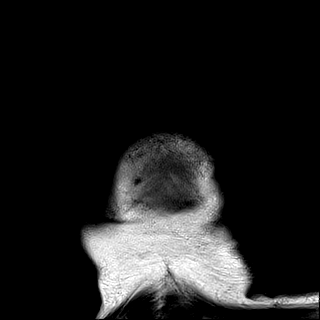
[im 28/28]
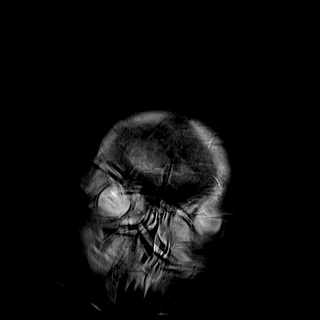

[44 of 48 positions shown; findings below may reference images not displayed]

FINDINGS: Brain: Examination moderately to severely degraded by motion
artifact.

Diffuse prominence of the CSF containing spaces compatible with
generalized age-related cerebral atrophy. No significant cerebral
white matter disease seen on this motion degraded exam.

No abnormal foci of restricted diffusion to suggest acute or
subacute ischemia. Gray-white matter differentiation maintained. No
encephalomalacia to suggest chronic cortical infarction. No definite
foci of susceptibility artifact to suggest acute or chronic
intracranial hemorrhage.

No mass lesion, midline shift or mass effect. Mild diffuse
ventricular prominence, felt to most likely be related to underlying
parenchymal volume loss. A degree of NPH is difficult to exclude,
and could be considered in the correct clinical setting. No visible
transependymal flow of CSF. No extra-axial fluid collection.
Pituitary gland suprasellar region within normal limits. Midline
structures intact and normal.

Vascular: Major intracranial vascular flow voids are grossly
maintained at the skull base.

Skull and upper cervical spine: Craniocervical junction within
normal limits. Bone marrow signal intensity normal. No scalp soft
tissue abnormality.

Sinuses/Orbits: Patient status post bilateral ocular lens
replacement. Globes and orbital soft tissues demonstrate no acute
finding. Paranasal sinuses are largely clear. No significant mastoid
effusion.

Other: There is diffuse soft tissue swelling seen involving the
partially visualized left face and posterior upper neck, with
involvement of the partially visualized left parotid gland. Findings
raise the possibility for acute parotitis, incompletely assessed on
this exam. No visible collections, ductal dilatation, or other focal
abnormality.
IMPRESSION: 1. Technically limited exam due to extensive motion artifact.
2. No acute intracranial abnormality.
3. Mild diffuse ventricular prominence, felt to most likely be
related to underlying parenchymal volume loss. A degree of NPH is
difficult to exclude, and could be considered in the correct
clinical setting. Correlation with LP with opening pressure could be
performed for further evaluation as warranted.
4. Diffuse soft tissue swelling involving the partially visualized
left face and posterior upper neck, with involvement of the
partially visualized left parotid gland. Findings raise the
possibility for acute parotitis, incompletely assessed on this exam.
Correlation with physical exam recommended.
